# Patient Record
Sex: Male | Born: 1950 | Race: White | Hispanic: No | Smoking: Former smoker
Health system: Southern US, Community
[De-identification: ages and names within clinical notes are randomized; demographics above are authoritative.]

## PROBLEM LIST (undated history)

## (undated) DIAGNOSIS — I639 Cerebral infarction, unspecified: Secondary | ICD-10-CM

## (undated) DIAGNOSIS — E119 Type 2 diabetes mellitus without complications: Secondary | ICD-10-CM

## (undated) DIAGNOSIS — I2510 Atherosclerotic heart disease of native coronary artery without angina pectoris: Secondary | ICD-10-CM

## (undated) DIAGNOSIS — N529 Male erectile dysfunction, unspecified: Secondary | ICD-10-CM

## (undated) DIAGNOSIS — I5042 Chronic combined systolic (congestive) and diastolic (congestive) heart failure: Secondary | ICD-10-CM

## (undated) DIAGNOSIS — R001 Bradycardia, unspecified: Secondary | ICD-10-CM

## (undated) DIAGNOSIS — Z87891 Personal history of nicotine dependence: Secondary | ICD-10-CM

## (undated) DIAGNOSIS — E785 Hyperlipidemia, unspecified: Secondary | ICD-10-CM

## (undated) DIAGNOSIS — N182 Chronic kidney disease, stage 2 (mild): Secondary | ICD-10-CM

## (undated) DIAGNOSIS — I739 Peripheral vascular disease, unspecified: Secondary | ICD-10-CM

## (undated) DIAGNOSIS — T50905A Adverse effect of unspecified drugs, medicaments and biological substances, initial encounter: Secondary | ICD-10-CM

## (undated) DIAGNOSIS — I119 Hypertensive heart disease without heart failure: Secondary | ICD-10-CM

## (undated) DIAGNOSIS — I779 Disorder of arteries and arterioles, unspecified: Secondary | ICD-10-CM

## (undated) HISTORY — PX: COLONOSCOPY: SHX174

## (undated) HISTORY — DX: Bradycardia, unspecified: R00.1

## (undated) HISTORY — PX: CORONARY STENT PLACEMENT: SHX1402

## (undated) HISTORY — PX: CORONARY ARTERY BYPASS GRAFT: SHX141

## (undated) HISTORY — DX: Type 2 diabetes mellitus without complications: E11.9

## (undated) HISTORY — DX: Adverse effect of unspecified drugs, medicaments and biological substances, initial encounter: T50.905A

## (undated) HISTORY — PX: CORONARY ANGIOPLASTY: SHX604

---

## 1996-07-29 HISTORY — PX: CARDIAC SURGERY: SHX584

## 2002-10-04 ENCOUNTER — Encounter: Payer: Self-pay | Admitting: Emergency Medicine

## 2002-10-04 ENCOUNTER — Emergency Department (HOSPITAL_COMMUNITY): Admission: EM | Admit: 2002-10-04 | Discharge: 2002-10-04 | Payer: Self-pay | Admitting: Emergency Medicine

## 2003-08-28 ENCOUNTER — Inpatient Hospital Stay (HOSPITAL_COMMUNITY): Admission: EM | Admit: 2003-08-28 | Discharge: 2003-08-30 | Payer: Self-pay | Admitting: Emergency Medicine

## 2005-08-05 ENCOUNTER — Emergency Department (HOSPITAL_COMMUNITY): Admission: EM | Admit: 2005-08-05 | Discharge: 2005-08-05 | Payer: Self-pay | Admitting: Emergency Medicine

## 2010-05-04 ENCOUNTER — Encounter: Payer: Self-pay | Admitting: Neurology

## 2011-08-26 ENCOUNTER — Emergency Department (HOSPITAL_COMMUNITY)
Admission: EM | Admit: 2011-08-26 | Discharge: 2011-08-26 | Disposition: A | Discharge status: Home / Self Care | Payer: BC Managed Care – PPO | Attending: Cardiovascular Disease | Admitting: Cardiovascular Disease

## 2011-08-26 ENCOUNTER — Encounter (HOSPITAL_COMMUNITY): Payer: Self-pay | Admitting: *Deleted

## 2011-08-26 ENCOUNTER — Emergency Department (HOSPITAL_COMMUNITY): Payer: BC Managed Care – PPO

## 2011-08-26 DIAGNOSIS — R51 Headache: Secondary | ICD-10-CM | POA: Insufficient documentation

## 2011-08-26 DIAGNOSIS — I10 Essential (primary) hypertension: Secondary | ICD-10-CM | POA: Insufficient documentation

## 2011-08-26 DIAGNOSIS — R079 Chest pain, unspecified: Secondary | ICD-10-CM | POA: Insufficient documentation

## 2011-08-26 DIAGNOSIS — I2510 Atherosclerotic heart disease of native coronary artery without angina pectoris: Secondary | ICD-10-CM | POA: Insufficient documentation

## 2011-08-26 HISTORY — DX: Atherosclerotic heart disease of native coronary artery without angina pectoris: I25.10

## 2011-08-26 HISTORY — DX: Cerebral infarction, unspecified: I63.9

## 2011-08-26 LAB — COMPREHENSIVE METABOLIC PANEL
Albumin: 3.6 g/dL (ref 3.5–5.2)
Calcium: 9 mg/dL (ref 8.4–10.5)
GFR calc non Af Amer: 89 mL/min — ABNORMAL LOW (ref >90)
Glucose, Bld: 139 mg/dL — ABNORMAL HIGH (ref 70–99)
Potassium: 3.5 mEq/L (ref 3.5–5.1)
Total Bilirubin: 0.3 mg/dL (ref 0.3–1.2)
Total Protein: 7 g/dL (ref 6.0–8.3)

## 2011-08-26 LAB — CARDIAC PANEL(CRET KIN+CKTOT+MB+TROPI)
CK, MB: 4.3 ng/mL — ABNORMAL HIGH (ref 0.3–4.0)
Relative Index: 2.4 (ref 0.0–2.5)
Total CK: 169 U/L (ref 7–232)
Troponin I: <0.3 ng/mL (ref <0.30)

## 2011-08-26 LAB — DIFFERENTIAL
Eosinophils Relative: 2 % (ref 0–5)
Monocytes Absolute: 0.9 10*3/uL (ref 0.1–1.0)
Neutro Abs: 5.7 10*3/uL (ref 1.7–7.7)

## 2011-08-26 LAB — CBC
Hemoglobin: 16.1 g/dL (ref 13.0–17.0)
MCHC: 35.5 g/dL (ref 30.0–36.0)
Platelets: 179 10*3/uL (ref 150–400)
RDW: 13.8 % (ref 11.5–15.5)
WBC: 8.4 10*3/uL (ref 4.0–10.5)

## 2011-08-26 MED ORDER — HYDRALAZINE HCL 20 MG/ML IJ SOLN
10.0000 mg | INTRAMUSCULAR | Status: DC | PRN
  Filled 2011-08-26: qty 0.5

## 2011-08-26 MED ORDER — TRIAMTERENE-HCTZ 37.5-25 MG PO TABS: 1.0000 | ORAL_TABLET | Freq: Every day | ORAL | Status: DC

## 2011-08-26 MED ORDER — CLOPIDOGREL BISULFATE 75 MG PO TABS: 75.0000 mg | ORAL_TABLET | Freq: Every day | ORAL | Status: DC

## 2011-08-26 MED ORDER — NEBIVOLOL HCL 10 MG PO TABS: 10.0000 mg | ORAL_TABLET | Freq: Every day | ORAL | Status: DC

## 2011-08-26 MED ORDER — HYDRALAZINE HCL 20 MG/ML IJ SOLN
10.0000 mg | Freq: Once | INTRAMUSCULAR | Status: AC
  Administered 2011-08-26: 10 mg via INTRAVENOUS
  Filled 2011-08-26 (×2): qty 0.5

## 2011-08-26 MED ORDER — NITROGLYCERIN 0.4 MG SL SUBL
0.4000 mg | SUBLINGUAL_TABLET | Freq: Once | SUBLINGUAL | Status: AC
  Administered 2011-08-26: 0.4 mg via SUBLINGUAL

## 2011-08-26 MED ORDER — LISINOPRIL 10 MG PO TABS
10.0000 mg | ORAL_TABLET | Freq: Once | ORAL | Status: AC
  Administered 2011-08-26: 10 mg via ORAL
  Filled 2011-08-26: qty 1

## 2011-08-26 MED ORDER — NITROGLYCERIN 0.4 MG SL SUBL: 0.4000 mg | SUBLINGUAL_TABLET | SUBLINGUAL | Status: DC | PRN

## 2011-08-26 MED ORDER — LISINOPRIL 10 MG PO TABS: 10.0000 mg | ORAL_TABLET | Freq: Every day | ORAL | Status: DC

## 2011-08-26 NOTE — ED Notes (Signed)
Pt states elevated blood pressure in 1200s/130 and has not been taking medications on a regular basis.    PT is having heaviness to chest and took 3 basa before coming in.  Pt took 3 zrytec last nite by mistake.

## 2011-08-26 NOTE — Consult Note (Signed)
CARDIOLOGY CONSULT NOTE   Patient ID: Francis Brown MRN: [id number] DOB/AGE: 04/30/1950 61 y.o.  Admit date: 08/26/2011  Primary Physician   No primary provider on file. Primary Cardiologist   Was Dr [name] in 1998 Reason for Consultation   Chest pain  Francis Brown is a 61 y.o. male with a history of CAD.  He is in the process of moving back to [place] from [facility]. He travels extensively for work and has not actually lived in [place] for 3 years. He does not yet have any local physicians in [place]. He has been in [place] for the last 11 months. While there, he has not been able to get a consistent supply of his blood pressure medications. He has taken them as he was able to obtain them. He has been out of his medications for over a month. He has symptoms when he is off his medications that include discomfort in the left side of his chest. He denies visual changes or headaches. When he gets these symptoms, he knows his blood pressure is very high and will try to obtain medical help. He has been hospitalized for this in the past, most recently in [place]. His blood pressure will be appropriately treated and his symptoms will resolve. His last heart catheterization was in [place] in 2010. He had 2 stents at that time.  Yesterday, he did not feel well. Today, he had his usual symptom of hypertension, left-sided chest discomfort. When it did not resolve, he came to the emergency room. In the emergency room his blood pressure is currently 170/113. He feels symptomatic with this. Of note, except for the periods when his blood pressure is very high, he does not have any history of chest pain with exertion, dyspnea with exertion or any other ischemic symptoms. The chest discomfort he feels when his blood pressure is high is nothing like the symptoms he had when he had his 2 heart attacks. He does not normally have any anginal symptoms. According to the patient, his symptoms generally  resolve once his blood pressure control is improved.  Past Medical History  Diagnosis Date  . Hypertension   . Coronary artery disease   . Stroke 2005  . History of percutaneous coronary intervention 10/03/2008    Xience 4.0x12 OM, Xience 4.0x8 OM in [place]  . Myocardial infarct, old 40    happened in [place] - medical therapy  . Myocardial infarct, old 67    happened in [place], came back and had CABG  . S/P CABG x 5 1998   Past Surgical History  Procedure Date  . Cardiac surgery   . Coronary stent placement     No Known Allergies  I have reviewed the patient's current medications   . nitroGLYCERIN  0.4 mg Sublingual Once   Medication Sig   Antiseborrheic Products, Misc. [facility] CREAM)  Apply 1 application topically daily.   aspirin EC 81 MG tablet Take 81 mg by mouth daily. Has  clopidogrel (PLAVIX) 75 MG tablet Take 75 mg by mouth daily. Has  lisinopril (PRINIVIL,ZESTRIL) 10 MG tablet Take 10 mg by mouth daily. Has  metoprolol tartrate (LOPRESSOR) 25 MG tablet Take 12.5 mg by mouth 2 (two) times daily. Almost out  Pyrithione Zinc [facility] SPRAY EX) Apply 1 application topically daily.   triamterene-hydrochlorothiazide (DYAZIDE) 50-25 MG  Take 1 capsule by mouth daily. Out      History   Social History  . Marital Status: Legally Separated  Spouse Name: N/A    Number of Children: N/A  . Years of Education: N/A   Occupational History  . Not on file.   Social History Main Topics  . Smoking status: Current Everyday Smoker  . Smokeless tobacco: Not on file  . Alcohol Use: Yes     occ  . Drug Use: No  . Sexually Active:    History   Social History Narrative   Pt in the process of moving back from [place]. He lives with his fianc. He does not exercise regularly but because he has been in [place] for the last 11 months and is in the process of moving, he has had a high activity level without difficulty. 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 Father died at 28 with a history of cardiac issues. His mother died at 72, no cardiac issues. No sibs with cardiac issues.    ROS: No recent illnesses, fevers or chills. When his BP is high, he feels tired before he gets the chest pain. He walks a great deal and climbs stairs. He denies musculoskeletal or GI complaints. He coughs occasionally but does not consistently wheeze. He has a history of melena. Occasionally he gets discomfort in his left foot such as a burning in the ball of his foot but no numbness. Full 14 point review of systems complete and found to be negative unless listed above.  Physical Exam: Blood pressure 143/77, pulse 55, temperature 97.7 F (36.5 C), temperature source Oral, resp. rate 14, SpO2 98.00%.  General: Well developed, well nourished, male in no acute distress Head: Eyes PERRLA, No xanthomas.   Normocephalic and atraumatic, oropharynx without edema or exudate. Dentition good Lungs: Few Bilateral dry rales Heart: HRRR S1 S2, no rub/gallop, soft systolic murmur. pulses are 2+ 3 extrem but slightly decreased in the left DP.   Neck: No carotid bruit. No lymphadenopathy.  JVD not elevated. Abdomen: Bowel sounds present, abdomen soft and non-tender without masses or hernias noted. Msk:  No spine or cva tenderness. No weakness, no joint deformities or effusions. Extremities: No clubbing or cyanosis. No edema.  Neuro: Alert and oriented X 3. No focal deficits noted. Psych:  Good affect, responds appropriately Skin: No rashes or lesions noted.  Labs:   Lab Results  Component Value Date   WBC 8.4 08/26/2011   HGB 16.1 08/26/2011   HCT 45.3 08/26/2011   MCV 93.6 08/26/2011   PLT 179 08/26/2011     Lab 08/26/11 1335  NA 138  K 3.5  CL 103  CO2 24  BUN 14  CREATININE 0.97  CALCIUM 9.0  PROT 7.0  BILITOT 0.3  ALKPHOS 80  ALT 37  AST 26  GLUCOSE 139*    Basename 08/26/11 1348  CKTOTAL 208  CKMB 4.9*  CK-MB index  2.4    TROPONINI <0.30   ECG:  26-Aug-2011 13:01:58  Normal sinus rhythm Possible Left atrial enlargement Inferior infarct , age undetermined Cannot rule out Anterior infarct , age undetermined Abnormal ECG Since last tracing from 08/04/05, prwp NOW PRESENT q wave in lead 3 persists Vent. rate 69 BPM PR interval 168 ms QRS duration 78 ms QT/QTc 394/422 ms P-R-T axes 59 10 38  Radiology:  Dg Chest Port 1 View 08/26/2011  *RADIOLOGY REPORT*  Clinical Data: Left chest discomfort.  Headache.  Hypertension.  PORTABLE CHEST - 1 VIEW  Comparison: None.  Findings: Both lungs are clear.  Heart size is at upper limits of  normal allowing for portable technique.  No evidence of pleural effusion.  No mass or lymphadenopathy identified.  Prior CABG noted.  IMPRESSION: No active disease.  Original Report Authenticated By: Danae Orleans, M.D.    ASSESSMENT AND PLAN:   The patient was seen today by Dr Elease Hashimoto, the patient evaluated and the data reviewed.  Principal Problem:  *Hypertension - for now, will use IV hydralazine to decrease blood pressure somewhat. We'll restart his home blood pressure medications and follow, with titration PRN.  Active Problems:  Chest pain - his symptoms are atypical and may be entirely secondary to HTN. We will try to obtain records from [facility] on his last heart catheterization. His ECG has no ST elevation. His CK-MB is slightly elevated. His bypass grafts are over 58 years old. He has already had 2 stents in his grafts. Cardiac enzymes are mildly abnormal with an elevated CKMB (index still normal). MD advise on further evaluation/admission.  Signed: Theodore Demark 08/26/2011, 4:46 PM Co-Sign MD  Attending Note:   The patient was seen and examined.  Agree with assessment and plan as noted above.  Pt has hx of CAD, HTN.  He presents tonight with elevated BP mostly due to the fact that he has run out of his meds.  He recently got back from [place] and is moving from [place] to  [place].  He complains of mild chest tightness that occurs when his BP is elevated.  The CP is clearly different from his MI pain.  He is not in any distress tonight. Exam is unremarkable. ECG is unremarkable  Initial set of enzymes reveals a CK MG of 4.9 .  We have rechecked a set of markers now.  If the enzymes have decreased or stayed the same, he can go.  If it is higher, he should stay and be observed.  Will restart Maxzide 1/2 tab a day Change metoprolol to Bystolic 10 mg a day. Add NTG 0.4 Continue lisinopril We will call to arrange follow up in a week or so.  Vesta Mixer, [place] Hageman., MD, [facility] 08/26/2011, 6:16 PM

## 2011-08-26 NOTE — ED Notes (Signed)
Patient denies pain at this time. Resting with SO at the bedside

## 2011-08-26 NOTE — ED Provider Notes (Addendum)
History     CSN: [id number]  Arrival date & time 08/26/11  1255   First MD Initiated Contact with Patient 08/26/11 1318      Chief Complaint  Patient presents with  . Chest Pain    (Consider location/radiation/quality/duration/timing/severity/associated sxs/prior treatment) HPI.... status post CABG x5 vessels in 1998. Additionally patient has been stented. he takes his medications erratically. Complains of chest pain described as a fullness this morning. Blood pressure today was 220/130. No dyspnea diaphoresis or nausea. He had a slight stroke in 2005 with left foot and left fingersinvolvement. Smokes three quarters of pack per day of cigarettes. Father had an MI at age 60.  no diabetes.  A level V caveat for urgent need for intervention  Past Medical History  Diagnosis Date  . Hypertension   . Coronary artery disease   . Stroke     Past Surgical History  Procedure Date  . Cardiac surgery   . Coronary stent placement     No family history on file.  History  Substance Use Topics  . Smoking status: Current Everyday Smoker  . Smokeless tobacco: Not on file  . Alcohol Use: Yes     occ      Review of Systems  All other systems reviewed and are negative.    Allergies  Review of patient's allergies indicates no known allergies.  Home Medications   Current Outpatient Rx  Name Route Sig Dispense Refill  . DERMAZINC CREAM EX CREA Apply externally Apply 1 application topically daily.    . ASPIRIN EC 81 MG PO TBEC Oral Take 81 mg by mouth daily.    [name] CLOPIDOGREL BISULFATE 75 MG PO TABS Oral Take 75 mg by mouth daily.    [name] LISINOPRIL 10 MG PO TABS Oral Take 10 mg by mouth daily.    [name] METOPROLOL TARTRATE 25 MG PO TABS Oral Take 12.5 mg by mouth 2 (two) times daily.    [place] Charon SPRAY EX Apply externally Apply 1 application topically daily.    . TRIAMTERENE-HCTZ 50-25 MG PO CAPS Oral Take 1 capsule by mouth daily.      BP 145/81  Pulse 59  Temp(Src) 97.7 F (36.5  C) (Oral)  Resp 18  SpO2 97%  Physical Exam  Nursing note and vitals reviewed. Constitutional: He is oriented to person, place, and time. He appears well-developed and well-nourished.       Hypertensive  HENT:  Head: Normocephalic and atraumatic.  Eyes: Conjunctivae and EOM are normal. Pupils are equal, round, and reactive to light.  Neck: Normal range of motion. Neck supple.  Cardiovascular: Normal rate and regular rhythm.   Pulmonary/Chest: Effort normal and breath sounds normal.  Abdominal: Soft. Bowel sounds are normal.  Musculoskeletal: Normal range of motion.  Neurological: He is alert and oriented to person, place, and time.  Skin: Skin is warm and dry.  Psychiatric: He has a normal mood and affect.    ED Course  Procedures (including critical care time)  Labs Reviewed  COMPREHENSIVE METABOLIC PANEL - Abnormal; Notable for the following:    Glucose, Bld 139 (*)    GFR calc non Af Amer 89 (*)    All other components within normal limits  CARDIAC PANEL(CRET KIN+CKTOT+MB+TROPI) - Abnormal; Notable for the following:    CK, MB 4.9 (*)    All other components within normal limits  CBC  DIFFERENTIAL   Dg Chest Port 1 View  08/26/2011  *RADIOLOGY REPORT*  Clinical Data: Left chest  discomfort.  Headache.  Hypertension.  PORTABLE CHEST - 1 VIEW  Comparison: None.  Findings: Both lungs are clear.  Heart size is at upper limits of normal allowing for portable technique.  No evidence of pleural effusion.  No mass or lymphadenopathy identified.  Prior CABG noted.  IMPRESSION: No active disease.  Original Report Authenticated By: Danae Orleans, M.D.     No diagnosis found.   Date: 08/26/2011  Rate: 69  Rhythm: normal sinus rhythm  QRS Axis: normal  Intervals: normal  ST/T Wave abnormalities: normal  Conduction Disutrbances: none  Narrative Interpretation: unremarkable     MDM  Patient has multiple cardiac risk factors. Given nitroglycerin and aspirin in ED.  discussed  with cardiologist Dr. Elease Hashimoto.  He will consult on patient        Donnetta Hutching, MD 08/26/11 1533  Donnetta Hutching, MD 09/24/11 1540

## 2011-08-26 NOTE — ED Notes (Signed)
Patient presents with c/o chest discomfort that is different from his 2 MI's in the past.  He returned from [place] 3 weeks ago and had not been taking his medications as directed.  Stated he has been feeling tired and weak and took his BP last night and it was elevated.  Took his BP this morning and it was 221 SBP so he decided to come to the hospital.

## 2011-08-26 NOTE — Discharge Instructions (Signed)
Chest Pain (Nonspecific) It is often hard to give a specific diagnosis for the cause of chest pain. There is always a chance that your pain could be related to something serious, such as a heart attack or a blood clot in the lungs. You need to follow up with your caregiver for further evaluation. CAUSES   Heartburn.   Pneumonia or bronchitis.   Anxiety or stress.   Inflammation around your heart (pericarditis) or lung (pleuritis or pleurisy).   A blood clot in the lung.   A collapsed lung (pneumothorax). It can develop suddenly on its own (spontaneous pneumothorax) or from injury (trauma) to the chest.   Shingles infection (herpes zoster virus).  The chest wall is composed of bones, muscles, and cartilage. Any of these can be the source of the pain.  The bones can be bruised by injury.   The muscles or cartilage can be strained by coughing or overwork.   The cartilage can be affected by inflammation and become sore (costochondritis).  DIAGNOSIS  Lab tests or other studies, such as X-rays, electrocardiography, stress testing, or cardiac imaging, may be needed to find the cause of your pain.  TREATMENT   Treatment depends on what may be causing your chest pain. Treatment may include:   Acid blockers for heartburn.   Anti-inflammatory medicine.   Pain medicine for inflammatory conditions.   Antibiotics if an infection is present.   You may be advised to change lifestyle habits. This includes stopping smoking and avoiding alcohol, caffeine, and chocolate.   You may be advised to keep your head raised (elevated) when sleeping. This reduces the chance of acid going backward from your stomach into your esophagus.   Most of the time, nonspecific chest pain will improve within 2 to 3 days with rest and mild pain medicine.  HOME CARE INSTRUCTIONS   If antibiotics were prescribed, take your antibiotics as directed. Finish them even if you start to feel better.   For the next few  days, avoid physical activities that bring on chest pain. Continue physical activities as directed.   Do not smoke.   Avoid drinking alcohol.   Only take over-the-counter or prescription medicine for pain, discomfort, or fever as directed by your caregiver.   Follow your caregiver's suggestions for further testing if your chest pain does not go away.   Keep any follow-up appointments you made. If you do not go to an appointment, you could develop lasting (chronic) problems with pain. If there is any problem keeping an appointment, you must call to reschedule.  SEEK MEDICAL CARE IF:   You think you are having problems from the medicine you are taking. Read your medicine instructions carefully.   Your chest pain does not go away, even after treatment.   You develop a rash with blisters on your chest.  SEEK IMMEDIATE MEDICAL CARE IF:   You have increased chest pain or pain that spreads to your arm, neck, jaw, back, or abdomen.   You develop shortness of breath, an increasing cough, or you are coughing up blood.   You have severe back or abdominal pain, feel nauseous, or vomit.   You develop severe weakness, fainting, or chills.   You have a fever.  THIS IS AN EMERGENCY. Do not wait to see if the pain will go away. Get medical help at once. Call your local emergency services (911 in [place] Do not drive yourself to the hospital. MAKE SURE YOU:   Understand these instructions.     Will watch your condition.   Will get help right away if you are not doing well or get worse.  Document Released: 01/08/2005 Document Revised: 03/20/2011 Document Reviewed: 11/04/2007 ExitCare Patient Information 2012 [employer].  RESOURCE GUIDE  Dental Problems  Patients with Medicaid: [facility]                     [facility] [address]                                           [address] Phone:  [phone]                                                    Phone:  [phone]  If unable to pay or uninsured, contact:  [facility] or [facility] to become qualified for the adult dental clinic.  Chronic Pain Problems Contact  Chronic [facility] Clinic  [phone] Patients need to be referred by their primary care doctor.  Insufficient Money for Medicine Contact United Way:  call "211" or [facility] 271-5999.  No Primary Care Doctor Call [facility]  832-8000 Other agencies that provide inexpensive medical care    [facility]  832-8035    [facility]  832-7272    [facility]  [phone]    Women[facility] Clinic  832-4777    [facility]  [phone]    [facility]  [phone]  Psychological Services [facility]  [phone] [facility]  [phone] [facility]   800 853-5163 (emergency services 641-4993)  Abuse/Neglect [place] Child Abuse Hotline (336) 641-3795 [place] Child Abuse Hotline 800-378-5315 (After Hours)  Emergency Shelter [facility] (336) 271-5985  [facility] (336) 275-9566 [facility] (704) 372-4663  MRSA Hotline #:   832-7006    [place] [facility]  [facility] [place]  [employer]                           [facility] [address] [place]                     [address]         [address]  [place]                                               [place]                              [place] Phone:  [phone]                                  Phone:  [phone]                   Phone:    [phone]  [facility] Phone:  [phone]  [place] Child Abuse Hotline (336) 342-1394 (336) 342-3537 (After Hours)  

## 2011-08-26 NOTE — ED Provider Notes (Signed)
BP 176/99  Pulse 56  Temp(Src) 97.7 F (36.5 C) (Oral)  Resp 18  SpO2 99%  Discussed with cardiology. Recommends repeat CE. If trop negative and no further elevation in CK-MB will discharge home with outpatient f/u. They have recommended changing his medications (see cardiology note for full details) and given him an extra dose of lisinopril in ED. Patient comfortable with plan. No CP at this time.  BP 147/94 in room at this time.   Forbes Cellar, MD 08/26/11 [phone]

## 2011-09-02 ENCOUNTER — Other Ambulatory Visit: Payer: BC Managed Care – PPO

## 2011-09-02 ENCOUNTER — Encounter: Payer: BC Managed Care – PPO | Admitting: Nurse Practitioner

## 2012-09-15 ENCOUNTER — Other Ambulatory Visit (HOSPITAL_COMMUNITY): Payer: Self-pay | Admitting: Physician Assistant

## 2012-09-16 ENCOUNTER — Other Ambulatory Visit: Payer: Self-pay

## 2012-09-16 NOTE — Telephone Encounter (Signed)
Ok to fill pt lisinopril minimum amount 30 day with no refills.indicated pt needs to schedule appt for additional refills in refill authorization per Alfonso Ramus, RN

## 2013-01-11 ENCOUNTER — Emergency Department (HOSPITAL_COMMUNITY): Payer: BC Managed Care – PPO

## 2013-01-11 ENCOUNTER — Encounter (HOSPITAL_COMMUNITY): Payer: Self-pay | Admitting: Adult Health

## 2013-01-11 ENCOUNTER — Other Ambulatory Visit: Payer: Self-pay

## 2013-01-11 ENCOUNTER — Inpatient Hospital Stay (HOSPITAL_COMMUNITY)
Admission: EM | Admit: 2013-01-11 | Discharge: 2013-01-13 | DRG: 014 | Disposition: A | Discharge status: Home / Self Care | Payer: BC Managed Care – PPO | Attending: Internal Medicine | Admitting: Internal Medicine

## 2013-01-11 DIAGNOSIS — G8190 Hemiplegia, unspecified affecting unspecified side: Secondary | ICD-10-CM | POA: Diagnosis present

## 2013-01-11 DIAGNOSIS — Z9861 Coronary angioplasty status: Secondary | ICD-10-CM

## 2013-01-11 DIAGNOSIS — Z79899 Other long term (current) drug therapy: Secondary | ICD-10-CM

## 2013-01-11 DIAGNOSIS — I6340 Cerebral infarction due to embolism of unspecified cerebral artery: Principal | ICD-10-CM | POA: Diagnosis present

## 2013-01-11 DIAGNOSIS — I639 Cerebral infarction, unspecified: Secondary | ICD-10-CM | POA: Diagnosis present

## 2013-01-11 DIAGNOSIS — Z951 Presence of aortocoronary bypass graft: Secondary | ICD-10-CM

## 2013-01-11 DIAGNOSIS — E785 Hyperlipidemia, unspecified: Secondary | ICD-10-CM | POA: Diagnosis present

## 2013-01-11 DIAGNOSIS — F17200 Nicotine dependence, unspecified, uncomplicated: Secondary | ICD-10-CM | POA: Diagnosis present

## 2013-01-11 DIAGNOSIS — I498 Other specified cardiac arrhythmias: Secondary | ICD-10-CM | POA: Diagnosis present

## 2013-01-11 DIAGNOSIS — I10 Essential (primary) hypertension: Secondary | ICD-10-CM | POA: Diagnosis present

## 2013-01-11 DIAGNOSIS — Z7982 Long term (current) use of aspirin: Secondary | ICD-10-CM

## 2013-01-11 DIAGNOSIS — I252 Old myocardial infarction: Secondary | ICD-10-CM

## 2013-01-11 DIAGNOSIS — I739 Peripheral vascular disease, unspecified: Secondary | ICD-10-CM | POA: Diagnosis present

## 2013-01-11 DIAGNOSIS — Z7902 Long term (current) use of antithrombotics/antiplatelets: Secondary | ICD-10-CM

## 2013-01-11 DIAGNOSIS — I2510 Atherosclerotic heart disease of native coronary artery without angina pectoris: Secondary | ICD-10-CM | POA: Diagnosis present

## 2013-01-11 DIAGNOSIS — Z8673 Personal history of transient ischemic attack (TIA), and cerebral infarction without residual deficits: Secondary | ICD-10-CM

## 2013-01-11 HISTORY — DX: Disorder of arteries and arterioles, unspecified: I77.9

## 2013-01-11 HISTORY — DX: Peripheral vascular disease, unspecified: I73.9

## 2013-01-11 HISTORY — DX: Male erectile dysfunction, unspecified: N52.9

## 2013-01-11 HISTORY — DX: Hyperlipidemia, unspecified: E78.5

## 2013-01-11 LAB — COMPREHENSIVE METABOLIC PANEL
AST: 23 U/L (ref 0–37)
Albumin: 4.1 g/dL (ref 3.5–5.2)
BUN: 12 mg/dL (ref 6–23)
Creatinine, Ser: 1.07 mg/dL (ref 0.50–1.35)
GFR calc Af Amer: 85 mL/min — ABNORMAL LOW (ref >90)
Potassium: 3.8 mEq/L (ref 3.5–5.1)
Total Bilirubin: 0.4 mg/dL (ref 0.3–1.2)
Total Protein: 7.7 g/dL (ref 6.0–8.3)

## 2013-01-11 LAB — DIFFERENTIAL
Basophils Absolute: 0.1 10*3/uL (ref 0.0–0.1)
Basophils Relative: 0 % (ref 0–1)
Eosinophils Absolute: 0.2 10*3/uL (ref 0.0–0.7)
Monocytes Absolute: 0.8 10*3/uL (ref 0.1–1.0)
Monocytes Relative: 7 % (ref 3–12)
Neutrophils Relative %: 78 % — ABNORMAL HIGH (ref 43–77)

## 2013-01-11 LAB — TROPONIN I: Troponin I: <0.3 ng/mL (ref <0.30)

## 2013-01-11 LAB — POCT I-STAT TROPONIN I: Troponin i, poc: 0 ng/mL (ref 0.00–0.08)

## 2013-01-11 LAB — CBC
Hemoglobin: 16.6 g/dL (ref 13.0–17.0)
MCH: 33.7 pg (ref 26.0–34.0)
MCHC: 36 g/dL (ref 30.0–36.0)
RDW: 13.7 % (ref 11.5–15.5)

## 2013-01-11 LAB — GLUCOSE, CAPILLARY: Glucose-Capillary: 146 mg/dL — ABNORMAL HIGH (ref 70–99)

## 2013-01-11 LAB — APTT: aPTT: 27 seconds (ref 24–37)

## 2013-01-11 LAB — PROTIME-INR: INR: 1.02 (ref 0.00–1.49)

## 2013-01-11 MED ORDER — IBUPROFEN 400 MG PO TABS
600.0000 mg | ORAL_TABLET | Freq: Once | ORAL | Status: AC
  Administered 2013-01-11: 22:00:00 600 mg via ORAL
  Filled 2013-01-11 (×2): qty 1

## 2013-01-11 NOTE — ED Notes (Signed)
Pt states that his right arm when numb about 3 hours ago and his hand was unable to move. Pt states he took 3 ASA and took his BP which was 157/88. Pt states he took it three times and all were similar to each other. Pt states he has a left sided headache for the last 4 weeks. He also states that he has had some blurry vision for about 3 weeks when looking at the computer screen.

## 2013-01-11 NOTE — ED Notes (Signed)
Resident MD in to see pt.

## 2013-01-11 NOTE — ED Notes (Signed)
Dr. Taylor in to see pt. 

## 2013-01-11 NOTE — ED Notes (Signed)
Presents with intermittent gradual onset of left sided headaches, blurred vision for 2 weeks, today left sided headache gradual came on this AM and then at 18:30  Right arm became numb and weak. Arm numbness resolved after 3 ASA and pt came to ER. Neurologically intact. No facial droop, no drift, no weakness. Alert and oriented. C/o headache on the left side, denies blurred vision.

## 2013-01-11 NOTE — ED Notes (Signed)
Pt back from CT

## 2013-01-12 ENCOUNTER — Encounter (HOSPITAL_COMMUNITY): Payer: Self-pay | Admitting: *Deleted

## 2013-01-12 ENCOUNTER — Inpatient Hospital Stay (HOSPITAL_COMMUNITY): Payer: BC Managed Care – PPO

## 2013-01-12 DIAGNOSIS — I10 Essential (primary) hypertension: Secondary | ICD-10-CM

## 2013-01-12 DIAGNOSIS — G459 Transient cerebral ischemic attack, unspecified: Secondary | ICD-10-CM

## 2013-01-12 DIAGNOSIS — I6350 Cerebral infarction due to unspecified occlusion or stenosis of unspecified cerebral artery: Secondary | ICD-10-CM

## 2013-01-12 DIAGNOSIS — I517 Cardiomegaly: Secondary | ICD-10-CM

## 2013-01-12 DIAGNOSIS — I639 Cerebral infarction, unspecified: Secondary | ICD-10-CM | POA: Diagnosis present

## 2013-01-12 DIAGNOSIS — I6340 Cerebral infarction due to embolism of unspecified cerebral artery: Principal | ICD-10-CM

## 2013-01-12 DIAGNOSIS — I6529 Occlusion and stenosis of unspecified carotid artery: Secondary | ICD-10-CM

## 2013-01-12 MED ORDER — ASPIRIN EC 81 MG PO TBEC
81.0000 mg | DELAYED_RELEASE_TABLET | Freq: Every day | ORAL | Status: DC
  Administered 2013-01-12 – 2013-01-13 (×2): 81 mg via ORAL
  Filled 2013-01-12 (×2): qty 1

## 2013-01-12 MED ORDER — ATORVASTATIN CALCIUM 40 MG PO TABS
40.0000 mg | ORAL_TABLET | Freq: Every day | ORAL | Status: DC
  Administered 2013-01-12 – 2013-01-13 (×2): 40 mg via ORAL
  Filled 2013-01-12 (×2): qty 1

## 2013-01-12 MED ORDER — ASPIRIN EC 81 MG PO TBEC: 81.0000 mg | DELAYED_RELEASE_TABLET | Freq: Every day | ORAL | Status: DC

## 2013-01-12 MED ORDER — HEPARIN SODIUM (PORCINE) 5000 UNIT/ML IJ SOLN
5000.0000 [IU] | Freq: Three times a day (TID) | INTRAMUSCULAR | Status: DC
  Administered 2013-01-12 – 2013-01-13 (×5): 5000 [IU] via SUBCUTANEOUS
  Filled 2013-01-12 (×7): qty 1

## 2013-01-12 MED ORDER — ONDANSETRON HCL 4 MG/2ML IJ SOLN: 4.0000 mg | Freq: Three times a day (TID) | INTRAMUSCULAR | Status: AC | PRN

## 2013-01-12 MED ORDER — CLOPIDOGREL BISULFATE 75 MG PO TABS
75.0000 mg | ORAL_TABLET | Freq: Every day | ORAL | Status: DC
  Administered 2013-01-12 – 2013-01-13 (×2): 75 mg via ORAL
  Filled 2013-01-12 (×3): qty 1

## 2013-01-12 NOTE — Progress Notes (Addendum)
Patient seen and admitted earlier this am by my associate. Please refer to the H and P for details regarding assessment and plan.  Will reassess next am.  Penny Pia  Continue stroke work up and recommendations per Neurologist team on board.

## 2013-01-12 NOTE — Progress Notes (Signed)
Stroke Team Progress Note  HISTORY [facility] is an 62 y.o. male, right handed, with a past medical history significant for HTN, CAD s/p CABG x5 and s/p PTCA, MI, smoking, stroke without residual deficit, presented to [facility] with a history of left sided HA x 1 month and transient episode of left arm paresthesias and weakness.   Stated that he never had similar symptoms in the right or left side of his body. He tells me that for the past 4 weeks he has been experiencing daily, nearly constant, mildly intense head pain localized over the left frontal area without associated nausea, vomiting, slurred speech, double vision, language or vision impairment. No recent fever, infection, head or neck trauma.   Tonight, approximately 2 hours before arriving to the ED, he developed acute onset of tingling and numbness from the right shoulder down to his hand, as well as inability to use the right hand due to weakness. The episode lasted for about 10 minutes and completely resolved.   His symptoms prompted CT brain that was unremarkable but MRI-DWI brain disclosed several punctate foci of reduced diffusion within the left frontal and parietal lobes numbering at least four en total.  At this moment, he complains only of mid left head pain but otherwise thinks that he is back to his normal state.  On daily aspirin and plavix   Patient was not a TPA candidate secondary to resolution of symptoms. He was admitted for further evaluation and treatment.  SUBJECTIVE Patient lying in the bed.  Overall he feels his condition is completely resolved. Has headache intermittently over one month. Had a stroke "pin" in 2006. His sx at that time were nausea and vomiting. Stroke was in back of head.  OBJECTIVE Most recent Vital Signs: Filed Vitals:   01/12/13 0500 01/12/13 0819 01/12/13 1000 01/12/13 1414  BP: 112/56 125/68 127/69 126/68  Pulse: 59 56 54 57  Temp: 98 F (36.7 C) 98.2 F (36.8 C) 98.3 F (36.8 C) 98.1  F (36.7 C)  TempSrc: Oral Oral Oral Oral  Resp: 18 18 18 18   Height:      Weight:      SpO2: 97% 97% 94% 98%   CBG (last 3)   Recent Labs  01/11/13 2011  GLUCAP 146*    IV Fluid Intake:     MEDICATIONS  . aspirin EC  81 mg Oral Daily  . atorvastatin  40 mg Oral Daily  . clopidogrel  75 mg Oral Daily  . heparin  5,000 Units Subcutaneous Q8H   PRN:    Diet:  Cardiac thin liquids Activity:   DVT Prophylaxis:  Heparin SQ  CLINICALLY SIGNIFICANT STUDIES Basic Metabolic Panel:   Recent Labs Lab 01/11/13 2006  NA 137  K 3.8  CL 99  CO2 21  GLUCOSE 151*  BUN 12  CREATININE 1.07  CALCIUM 9.2   Liver Function Tests:   Recent Labs Lab 01/11/13 2006  AST 23  ALT 36  ALKPHOS 80  BILITOT 0.4  PROT 7.7  ALBUMIN 4.1   CBC:   Recent Labs Lab 01/11/13 2006  WBC 12.7*  NEUTROABS 9.9*  HGB 16.6  HCT 46.1  MCV 93.7  PLT 186   Coagulation:   Recent Labs Lab 01/11/13 2006  LABPROT 13.2  INR 1.02   Cardiac Enzymes:   Recent Labs Lab 01/11/13 2006  TROPONINI <0.30   Urinalysis: No results found for this basename: COLORURINE, APPERANCEUR, LABSPEC, PHURINE, GLUCOSEU, HGBUR, BILIRUBINUR, KETONESUR, PROTEINUR,  UROBILINOGEN, NITRITE, LEUKOCYTESUR,  in the last 168 hours Lipid Panel No results found for this basename: chol,  trig,  hdl,  cholhdl,  vldl,  ldlcalc   HgbA1C  Lab Results  Component Value Date   HGBA1C 6.4* 01/12/2013    Urine Drug Screen:   No results found for this basename: labopia,  cocainscrnur,  labbenz,  amphetmu,  thcu,  labbarb    Alcohol Level: No results found for this basename: ETH,  in the last 168 hours  Ct Head (brain) Wo Contrast 01/11/2013    Normal head CT.    Mr [name] Head Wo Contrast 01/12/2013  : Normal intracranial MR angiography of the large and medium size vessels.   Mr Brain Wo Contrast 01/11/2013    Multiple punctate left frontotemporal foci of reduced diffusion concerning for embolic phenomenon in left  cerebral artery territory.  No hemorrhagic conversion.  Mild white matter changes suggestive chronic small vessel ischemic disease in addition to remote posterior pontine infarct.    2D Echocardiogram    Carotid Doppler  Right - 1% to 39% ICA stenosis. Vertebral artery flow is antegrade. Left - There is an 80% to 99% ICA stenosis. Vertebral artery flow is antegrade   CXR    EKG  normal sinus rhythm.   Therapy Recommendations    Physical Exam   Head: normocephalic.  Neck: supple, no bruits, no JVD.  Cardiac: no murmurs.  Lungs: clear.  Abdomen: soft, no tender, no mass.  Extremities: no edema Neurological Exam ;  Awake  Alert oriented x 3. Normal speech and language.eye movements full without nystagmus.fundi were not visualized. Vision acuity and fields appear normal. Hearing is normal. Palatal movements are normal. Face symmetric. Tongue midline. Normal strength, tone, reflexes and coordination. Normal sensation. Gait deferred. ASSESSMENT Mr. [facility] is a 62 y.o. male presenting with transient right arm hemisensory/hemiparesis. Imaging confirms multiple punctate left frontotemporal foci of reduced diffusion concerning for embolic phenomenon in left cerebral artery territory. Infarct felt to be embolic due to underlying cardiac source or most likely due to subtotal occlusive disease of left internal carotid artery.  On aspirin 81 mg orally every day and clopidogrel 75 mg orally every day prior to admission. Now on aspirin 81 mg orally every day and clopidogrel 75 mg orally every day for secondary stroke prevention. Patient with resultant transient symptoms. Work up underway.   Hypertension  Coronary artery disease, s/p CABG, stents  Symptomatic carotid stenosis, left.  Brown day # 1  TREATMENT/PLAN  Continue aspirin 81 mg orally every day and clopidogrel 75 mg orally every day for secondary stroke prevention. Await TTE. (will not pursue TEE/LOOP as carotid disease  source of symptoms) Risk factor modification Doppler results: I have consulted [facility], Dr. [name] for surgical evaluation for symptomatic left carotid stenosis.   I have personally obtained a history, examined the patient, evaluated imaging results, and formulated the assessment and plan of care. I agree with the above. Delia Heady, MD

## 2013-01-12 NOTE — Progress Notes (Addendum)
[facility]  CONSULT NOTE   MRN : [id number]   Reason for Consult: TIA in pt with >80% L ICA stenosis   Referring Physician: Dr Sethi   History of Present Illness: [facility] is a 62 y.o. male with PMH, MI, CABG (1998) and coronary stenting, Smoker, borderline DM and PAD who states that yesterday he developed sudden onset of right UE numbness and weakness which resolved in 10 minutes. He had had a left frontal H/A for several days with visual disturbances prior to this - "cloudy" vision in the left eye. All of these symptoms have completely resolved including the H/A. He states he was told he had a "TIA" in 2005 with nausea,vomiting with MRA of neck showing moderate stenosis of Left ICA. CT head at that time Negative for CVA.   Past Medical History   Diagnosis  Date   .  Hypertension    .  Coronary artery disease    .  Stroke  2005   .  History of percutaneous coronary intervention  10/03/2008     Xience 4.0x12 OM, Xience 4.0x8 OM in [place]   .  Myocardial infarct, old  1996     happened in [place] - medical therapy   .  Myocardial infarct, old  1998     happened in [place], came back and had CABG   .  S/P CABG x 5  1998    Past Surgical History   Procedure  Laterality  Date   .  Cardiac surgery     .  Coronary stent placement     Social History  History   Substance Use Topics   .  Smoking status:  Current Every Day Smoker -- 0.80 packs/day for 30 years     Types:  Cigarettes   .  Smokeless tobacco:  Not on file   .  Alcohol Use:  Yes      Comment: occ   Family History  Father had HX HTN, CAD - died at age 65  No Known Allergies  Current Facility-Administered Medications   Medication  Dose  Route  Frequency  Provider  Last Rate  Last Dose   .  aspirin EC tablet 81 mg  81 mg  Oral  Daily  Osvaldo Camilo, MD   81 mg at 01/12/13 1012   .  atorvastatin (LIPITOR) tablet 40 mg  40 mg  Oral  Daily  Jared M. Gardner, DO   40 mg at 01/12/13 1012   .   clopidogrel (PLAVIX) tablet 75 mg  75 mg  Oral  Daily  Jared M. Gardner, DO   75 mg at 01/12/13 0753   .  heparin injection 5,000 Units  5,000 Units  Subcutaneous  Q8H  Jared M. Gardner, DO   5,000 Units at 01/12/13 0619   Pt meds include:  Statin :Yes  Betablocker: Yes  ASA: Yes  Other anticoagulants/antiplatelets: plavix   REVIEW OF SYSTEMS  General: [ ] Weight loss, [ ] Fever, [ ] chills  Neurologic: [ ] Dizziness, [ ] Blackouts, [ ] Seizure  [ ] Stroke, [x ] "Mini stroke", [ ] Slurred speech, [ ] Temporary blindness; [x ] weakness in arms or legs, [ ] Hoarseness [ ] Dysphagia  Cardiac: [ ] Chest pain/pressure, [ ] Shortness of breath at rest [ ] Shortness of breath with exertion, [ ] Atrial fibrillation or irregular heartbeat  Vascular: [ ] Pain in legs with walking, [ ]   Pain in legs at rest, [ ] Pain in legs at night,  [ ] Non-healing ulcer, [ ] Blood clot in vein/DVT,  Pulmonary: [ ] Home oxygen, [ ] Productive cough, [ ] Coughing up blood, [ ] Asthma,  [ ] Wheezing [ ] COPD  Musculoskeletal: [ ] Arthritis, [ ] Low back pain, [ ] Joint pain  Hematologic: [ ] Easy Bruising, [ ] Anemia; [ ] Hepatitis  Gastrointestinal: [ ] Blood in stool, [ ] Gastroesophageal Reflux/heartburn,  Urinary: [ ] chronic Kidney disease, [ ] on HD - [ ] MWF or [ ] TTHS, [ ] Burning with urination, [ ] Difficulty urinating  Skin: [ ] Rashes, [ ] Wounds  Psychological: [ ] Anxiety, [ ] Depression   Physical Examination  Filed Vitals:    01/12/13 0500  01/12/13 0819  01/12/13 1000  01/12/13 1414   BP:  112/56  125/68  127/69  126/68   Pulse:  59  56  54  57   Temp:  98 F (36.7 C)  98.2 F (36.8 C)  98.3 F (36.8 C)  98.1 F (36.7 C)   TempSrc:  Oral  Oral  Oral  Oral   Resp:  18  18  18  18   Height:       Weight:       SpO2:  97%  97%  94%  98%   Body mass index is 31.45 kg/(m^2).  General: WDWN in NAD  Gait: Normal  HENT: WNL  Eyes: Pupils equal  Pulmonary: normal non-labored breathing ,  without Rales, rhonchi, wheezing  Cardiac: RRR, without Murmurs, rubs or gallops;  No carotid bruits bilat.  Abdomen: soft, NT, no masses  Skin: no rashes, ulcers noted; no Gangrene , no cellulitis; no open wounds;  Vascular Exam/Pulses: 3+ Radial pulses bilaterally  DP/PT pulses are not palpable  BLE are warm with normal color, motion and sensation  Musculoskeletal: no muscle wasting or atrophy; no edema  Neurologic: A&O X 3; Appropriate Affect ;  SENSATION: normal;  MOTOR FUNCTION: 5/5 Symmetric  Speech is fluent/normal  No tongue deviation or facial droop noted  Lymph: no LAD  Psych: judgment intact, mood and affect appropriate for clinical situation   Significant Diagnostic Studies:  CBC  Lab Results   Component  Value  Date    WBC  12.7*  01/11/2013    HGB  16.6  01/11/2013    HCT  46.1  01/11/2013    MCV  93.7  01/11/2013    PLT  186  01/11/2013   BMET    Component  Value  Date/Time    NA  137  01/11/2013 2006    K  3.8  01/11/2013 2006    CL  99  01/11/2013 2006    CO2  21  01/11/2013 2006    GLUCOSE  151*  01/11/2013 2006    BUN  12  01/11/2013 2006    CREATININE  1.07  01/11/2013 2006    CALCIUM  9.2  01/11/2013 2006    GFRNONAA  74*  01/11/2013 2006    GFRAA  85*  01/11/2013 2006   Estimated Creatinine Clearance: 92 ml/min (by C-G formula based on Cr of 1.07).  COAG  Lab Results   Component  Value  Date    INR  1.02  01/11/2013    Non-Invasive Vascular Imaging: Carotid duplex completed. 01/12/2013  Preliminary report: Right - 1% to 39% ICA stenosis. Vertebral artery flow   is antegrade. Left - There is an 80% to 99% ICA stenosis. Vertebral artery flow is antegrade.  Radiology:   Ct Head (brain) Wo Contrast  01/11/2013 CLINICAL DATA: Headache with right arm numbness. EXAM: CT HEAD WITHOUT CONTRAST TECHNIQUE: Contiguous axial images were obtained from the base of the skull through the vertex without intravenous contrast. COMPARISON: None. FINDINGS: The brain demonstrates no  evidence of hemorrhage, infarction, edema, mass effect, extra-axial fluid collection, hydrocephalus or mass lesion. The skull is unremarkable. IMPRESSION: Normal head CT. Electronically Signed By: Glenn Yamagata On: 01/11/2013 21:29   Mr Mra Head Wo Contrast  01/12/2013 CLINICAL DATA: Right-sided weakness. Acute stroke by MRI. EXAM: MRA HEAD WITHOUT CONTRAST TECHNIQUE: MRA HEAD WITHOUT CONTRAST COMPARISON: 01/11/2013 FINDINGS: Both internal carotid arteries are widely patent into the brain. The anterior and middle cerebral vessels are patent without proximal stenosis, aneurysm or vascular malformation. Both vertebral arteries are patent to the basilar. No basilar stenosis. Posterior circulation branch vessels are patent. There are large posterior communicating arteries bilaterally. IMPRESSION: Normal intracranial MR angiography of the large and medium size vessels. Electronically Signed By: Mark Shogry M.D. On: 01/12/2013 10:11  Mr Brain Wo Contrast  01/11/2013 *RADIOLOGY REPORT* Clinical Data: Headache for 1 month. Blurred vision. MRI HEAD WITHOUT CONTRAST Technique: Multiplanar, multiecho pulse sequences of the brain and surrounding structures were obtained according to standard protocol without intravenous contrast. Comparison: CT of head January 11, 2013 at 2119 hours. Findings: Punctate foci of reduced diffusion within the left frontal and parietal lobes numbering at least four en toto. Minimal FLAIR hyperintense signal. No susceptibility artifact to suggest hemorrhage. The ventricles and sulci are normal for patient's age. A few scattered sub centimeter supratentorial white matter FLAIR T2 hyperintensities are seen, suggesting sequela chronic small vessel ischemic disease, within normal range for patient's age. Superimposed to 9 x 5 mm posterior pontine midline T2 hyperintensity most consistent with each remote ischemia. No midline shift or mass effect. No abnormal extra-axial fluid collections. Normal  major intracranial vascular flow voids seen at the skull base. No suspicious calvarial bone marrow signal. Craniocervical junction maintained. Fluid replaced sella is not expanded. Ocular globes and orbital contents are not suspicious but not tailored for evaluation. Under pneumatized frontal sinuses, no paranasal sinus air-fluid levels. Mastoid air cells are well-aerated. Mild temporomandibular osteoarthrosis. IMPRESSION: Multiple punctate left frontotemporal foci of reduced diffusion concerning for embolic phenomenon in left cerebral artery territory. No hemorrhagic conversion. Mild white matter changes suggestive chronic small vessel ischemic disease in addition to remote posterior pontine infarct. Critical Value/emergent results were called by telephone at the time of interpretation on January 11, 2013 at 2300 hours to Megan Taylor, who verbally acknowledged these results. Original Report Authenticated By: Courtnay Bloomer   ASSESSMENT/PLAN: Emani R Bednarczyk is a 62 y.o. male with Hx CAD and PAD who had TIA yesterday with all symptoms resolved. Pt had evidence of moderate Left ICA stenosis in 2005. Carotid duplex done today showed 80-99% Left ICA stenosis. Pt symptoms correspond with left side stenosis. Will get CTA of neck to confirm carotid duplex and to assess position of carotid bifurcation in the neck. Timing and need for left CEA will be decided once CTA is reviewed. Continue ASA. Pt will need cardiac clearance for surgery. He see Dr. Turner with [facility].   ROCZNIAK,REGINA J  01/12/2013  4:17 PM   Addendum  I have independently interviewed and examined the patient, and I agree with the physician assistant's findings. Pt's sx correspond to disease on

## 2013-01-12 NOTE — Evaluation (Signed)
Physical Therapy Evaluation Patient Details Name: [facility] MRN: [id number] DOB: Apr 04, 1951 Today's Date: 01/12/2013 Time: 1030-1040 PT Time Calculation (min): 10 min  PT Assessment / Plan / Recommendation History of Present Illness  pt presents with 4 week hx of L sided headache and L UE numbness, which has now resolved.    Clinical Impression  Pt independent with all mobility.  No further PT needs at this time.  Pt ed on s/s of CVA.  Will sign off.      PT Assessment  Patent does not need any further PT services    Follow Up Recommendations  No PT follow up    Does the patient have the potential to tolerate intense rehabilitation      Barriers to Discharge        Equipment Recommendations  None recommended by PT    Recommendations for Other Services     Frequency      Precautions / Restrictions Precautions Precautions: None Restrictions Weight Bearing Restrictions: No   Pertinent Vitals/Pain Denies pain.        Mobility  Bed Mobility Bed Mobility: Supine to Sit;Sitting - Scoot to Edge of Bed Supine to Sit: 7: Independent Sitting - Scoot to Edge of Bed: 7: Independent Transfers Transfers: Sit to Stand;Stand to Sit Sit to Stand: 7: Independent;From bed Stand to Sit: 7: Independent;To bed Ambulation/Gait Ambulation/Gait Assistance: 7: Independent Ambulation Distance (Feet): 250 Feet Assistive device: None Gait Pattern: Within Functional Limits Stairs: Yes Stairs Assistance: 7: Independent Stair Management Technique: One rail Right;Forwards Number of Stairs: 11 Wheelchair Mobility Wheelchair Mobility: No Modified Rankin (Stroke Patients Only) Pre-Morbid Rankin Score: No symptoms Modified Rankin: No symptoms    Exercises     PT Diagnosis:    PT Problem List:   PT Treatment Interventions:       PT Goals(Current goals can be found in the care plan section)    Visit Information  Last PT Received On: 01/12/13 Assistance Needed: +1 History of  Present Illness: pt presents with 4 week hx of L sided headache and L UE numbness, which has now resolved.         Prior Functioning  Home Living Family/patient expects to be discharged to:: Private residence Living Arrangements: Spouse/significant other Available Help at Discharge: Family;Available PRN/intermittently Type of Home: House Home Access: Stairs to enter Entrance Stairs-Number of Steps: 3 Home Layout: Multi-level Alternate Level Stairs-Number of Steps: flight Alternate Level Stairs-Rails: Right Home Equipment: None Prior Function Level of Independence: Independent Communication Communication: No difficulties Dominant Hand: Right    Cognition  Cognition Arousal/Alertness: Awake/alert Behavior During Therapy: WFL for tasks assessed/performed Overall Cognitive Status: Within Functional Limits for tasks assessed    Extremity/Trunk Assessment Upper Extremity Assessment Upper Extremity Assessment: Overall WFL for tasks assessed Lower Extremity Assessment Lower Extremity Assessment: Overall WFL for tasks assessed Cervical / Trunk Assessment Cervical / Trunk Assessment: Normal   Balance Balance Balance Assessed: Yes Standardized Balance Assessment Standardized Balance Assessment: Dynamic Gait Index Dynamic Gait Index Level Surface: Normal Change in Gait Speed: Normal Gait with Horizontal Head Turns: Normal Gait with Vertical Head Turns: Normal Gait and Pivot Turn: Normal Step Over Obstacle: Normal Step Around Obstacles: Normal Steps: Normal Total Score: 24  End of Session PT - End of Session Activity Tolerance: Patient tolerated treatment well Patient left: in bed;with call bell/phone within reach Nurse Communication: Mobility status  GP     [facility], [place] [phone] 01/12/2013, 12:15 PM

## 2013-01-12 NOTE — Progress Notes (Signed)
Echocardiogram 2D Echocardiogram has been performed.  Rashawn Rayman 01/12/2013, 12:40 PM

## 2013-01-12 NOTE — Progress Notes (Signed)
VASCULAR LAB PRELIMINARY  PRELIMINARY  PRELIMINARY  PRELIMINARY  Carotid duplex completed.    Preliminary report:  Right - 1% to 39% ICA stenosis. Vertebral artery flow is antegrade. Left -  There is an 80% to 99% ICA stenosis. Vertebral artery flow is antegrade.  [facility], RVS 01/12/2013, 12:41 PM

## 2013-01-12 NOTE — ED Provider Notes (Signed)
CSN: [id number]     Arrival date & time 01/11/13  1936 History   First MD Initiated Contact with Patient 01/11/13 2034     Chief Complaint  Patient presents with  . Stroke Symptoms   (Consider location/radiation/quality/duration/timing/severity/associated sxs/prior Treatment) HPI Comments: 62 y/o male with history of CAD s/p CABG, prior CVA in 2005 with no residual deficits presenting with left temporal headache x 1 month and new right arm weakness/numbness. Reports daily intermittent headaches in left temporal region associated with blurry vision when looking at the computer screen. Right arm became numb and weak at 18:30, lasted 10 minutes and resolved. No numbness/weakness on arrival. Also reports HA resolving. No fever, dizziness, voice change, difficulty swallowing, gait disturbance.  The history is provided by the patient. No language interpreter was used.    Past Medical History  Diagnosis Date  . Hypertension   . Coronary artery disease   . Stroke 2005  . History of percutaneous coronary intervention 10/03/2008    Xience 4.0x12 OM, Xience 4.0x8 OM in [place]  . Myocardial infarct, old 72    happened in [place] - medical therapy  . Myocardial infarct, old 73    happened in [place], came back and had CABG  . S/P CABG x 5 1998   Past Surgical History  Procedure Laterality Date  . Cardiac surgery    . Coronary stent placement     History reviewed. No pertinent family history. History  Substance Use Topics  . Smoking status: Current Every Day Smoker -- 0.80 packs/day for 30 years    Types: Cigarettes  . Smokeless tobacco: Not on file  . Alcohol Use: Yes     Comment: occ    Review of Systems  Constitutional: Negative for fever and activity change.  HENT: Negative for hearing loss, congestion and sinus pressure.   Eyes: Positive for visual disturbance. Negative for pain.  Respiratory: Negative for chest tightness and shortness of breath.   Cardiovascular: Negative for  chest pain and palpitations.  Musculoskeletal: Negative for gait problem.  Neurological: Positive for numbness and headaches. Negative for dizziness, tremors, seizures, facial asymmetry, speech difficulty and weakness.  All other systems reviewed and are negative.    Allergies  Review of patient's allergies indicates no known allergies.  Home Medications   Current Outpatient Rx  Name  Route  Sig  Dispense  Refill  . Antiseborrheic Products, Misc. [facility] CREAM) CREA   Apply externally   Apply 1 application topically daily.         [name] aspirin EC 81 MG tablet   Oral   Take 81 mg by mouth daily.         [name] atorvastatin (LIPITOR) 40 MG tablet   Oral   Take 40 mg by mouth daily.         [name] CIALIS 20 MG tablet   Oral   Take 20 mg by mouth daily as needed.         . clopidogrel (PLAVIX) 75 MG tablet   Oral   Take 1 tablet (75 mg total) by mouth daily.   30 tablet   11   . lisinopril (PRINIVIL,ZESTRIL) 10 MG tablet      TAKE 1 TABLET BY MOUTH DAILY.   30 tablet   0     Patient must schedule and keep a follow appointmen ...   . metoprolol succinate (TOPROL-XL) 25 MG 24 hr tablet   Oral   Take 25 mg by mouth daily.         [name]  EXPIRED: nebivolol (BYSTOLIC) 10 MG tablet   Oral   Take 1 tablet (10 mg total) by mouth daily.   30 tablet   11   . Pyrithione Zinc [facility] SPRAY EX)   Apply externally   Apply 1 application topically daily.         [name] EXPIRED: triamterene-hydrochlorothiazide (MAXZIDE-25) 37.5-25 MG per tablet   Oral   Take 1 each (1 tablet total) by mouth daily.   30 tablet   11   . EXPIRED: nitroGLYCERIN (NITROSTAT) 0.4 MG SL tablet   Sublingual   Place 1 tablet (0.4 mg total) under the tongue every 5 (five) minutes as needed for chest pain.   25 tablet   3    BP 113/71  Pulse 52  Temp(Src) 98.8 F (37.1 C) (Oral)  Resp 13  Ht 6' (1.829 m)  Wt 231 lb (104.781 kg)  BMI 31.32 kg/m2  SpO2 97% Physical Exam  Vitals  reviewed. Constitutional: He is oriented to person, place, and time. He appears well-developed and well-nourished. No distress.  HENT:  Head: Atraumatic.  Eyes: EOM are normal. Pupils are equal, round, and reactive to light.  Neck: Neck supple. Carotid bruit is not present.  Cardiovascular: Normal rate, regular rhythm, normal heart sounds and intact distal pulses.   Pulmonary/Chest: Effort normal and breath sounds normal.  Abdominal: Soft. Bowel sounds are normal. He exhibits no distension. There is no tenderness.  Musculoskeletal: Normal range of motion.  Neurological: He is alert and oriented to person, place, and time. He has normal strength and normal reflexes. No cranial nerve deficit or sensory deficit. He exhibits normal muscle tone. He displays a negative Romberg sign. Coordination and gait normal. GCS eye subscore is 4. GCS verbal subscore is 5. GCS motor subscore is 6.  No pronator drift. Normal FTN and heel shin testing.   Skin: Skin is warm and dry.    ED Course  Procedures (including critical care time) Labs Review Labs Reviewed  CBC - Abnormal; Notable for the following:    WBC 12.7 (*)    All other components within normal limits  DIFFERENTIAL - Abnormal; Notable for the following:    Neutrophils Relative % 78 (*)    Neutro Abs 9.9 (*)    All other components within normal limits  COMPREHENSIVE METABOLIC PANEL - Abnormal; Notable for the following:    Glucose, Bld 151 (*)    GFR calc non Af Amer 74 (*)    GFR calc Af Amer 85 (*)    All other components within normal limits  GLUCOSE, CAPILLARY - Abnormal; Notable for the following:    Glucose-Capillary 146 (*)    All other components within normal limits  PROTIME-INR  APTT  TROPONIN I  POCT I-STAT TROPONIN I   Imaging Review Ct Head (brain) Wo Contrast  01/11/2013   CLINICAL DATA:  Headache with right arm numbness.  EXAM: CT HEAD WITHOUT CONTRAST  TECHNIQUE: Contiguous axial images were obtained from the base of  the skull through the vertex without intravenous contrast.  COMPARISON:  None.  FINDINGS: The brain demonstrates no evidence of hemorrhage, infarction, edema, mass effect, extra-axial fluid collection, hydrocephalus or mass lesion. The skull is unremarkable.  IMPRESSION: Normal head CT.   Electronically Signed   By: Irish Lack   On: 01/11/2013 21:29   Mr Brain Wo Contrast  01/11/2013   *RADIOLOGY REPORT*  Clinical Data: Headache for 1 month.  Blurred vision.  MRI HEAD WITHOUT CONTRAST  Technique:  Multiplanar, multiecho pulse sequences of the brain and surrounding structures were obtained according to standard protocol without intravenous contrast.  Comparison: CT of head January 11, 2013 at 2119 hours.  Findings: Punctate foci of reduced diffusion within the left frontal and parietal lobes numbering at least four en toto. Minimal FLAIR hyperintense signal.  No susceptibility artifact to suggest hemorrhage.  The ventricles and sulci are normal for patient's age.  A few scattered sub centimeter supratentorial white matter FLAIR T2 hyperintensities are seen, suggesting sequela chronic small vessel ischemic disease, within normal range for patient's age. Superimposed to 9 x 5 mm posterior pontine midline T2 hyperintensity most consistent with each remote ischemia.  No midline shift or mass effect.  No abnormal extra-axial fluid collections.  Normal major intracranial vascular flow voids seen at the skull base.  No suspicious calvarial bone marrow signal.  Craniocervical junction maintained.  Fluid replaced sella is not expanded.  Ocular globes and orbital contents are not suspicious but not tailored for evaluation.  Under pneumatized frontal sinuses, no paranasal sinus air-fluid levels.  Mastoid air cells are well-aerated.  Mild temporomandibular osteoarthrosis.  IMPRESSION: Multiple punctate left frontotemporal foci of reduced diffusion concerning for embolic phenomenon in left cerebral artery territory.  No  hemorrhagic conversion.  Mild white matter changes suggestive chronic small vessel ischemic disease in addition to remote posterior pontine infarct.  Critical Value/emergent results were called by telephone at the time of interpretation on January 11, 2013 at 2300 hours to Abagail Kitchens, who verbally acknowledged these results.   Original Report Authenticated By: Awilda Metro    Date: 01/12/2013  Rate: 66  Rhythm: normal sinus rhythm  QRS Axis: normal  Intervals: normal  ST/T Wave abnormalities: normal  Conduction Disutrbances:none  Narrative Interpretation: sinus rhythm, no ischemic changes  Old EKG Reviewed: unchanged    MDM  No diagnosis found.  62 y/o male with history of CAD, prior MI s/p CABG, CVA presenting with one month of left temporal headaches and new onset right arm numbness/weakness of 10 min duration. Arm symptoms now resolved. Nonfocal neurological exam. MRI showing multiple punctate left frontotemporal ischemic lesions. Embolic source suspected but patient has no history of atrial fibrillation and EKG demonstrates NSR. Labs unremarkable. Neurology consulted for evaluation and recommend further stroke evaluation. Medicine to admit.   Labs and imaging reviewed in my medical decision making. Patient discussed with my attending, Dr. Rosalia Hammers.    Abagail Kitchens, MD 01/12/13 1250

## 2013-01-12 NOTE — Consult Note (Addendum)
NEURO HOSPITALIST CONSULT NOTE    Reason for Consult: HA, right arm paresthesias and weakness (resolved)  HPI:                                                                                                                                          Francis Brown is an 62 y.o. male, right handed, with a past medical history significant for HTN, CAD s/p CABG x5 and s/p PTCA, MI, smoking, stroke without residual deficit, presented to [facility] ED with a history of left sided HA x 1 month and transient episode of left arm paresthesias and weakness. Stated that he never had similar symptoms in the right or left side of his body. He tells me that for the past 4 weeks he has been experiencing daily, nearly constant, mildly intense head pain localized over the left frontal area without associated nausea, vomiting, slurred speech, double vision, language or vision impairment. No recent fever, infection, head or neck trauma. Tonight, approximately 2 hours before arriving to the ED, he developed acute onset of tingling and numbness from the right shoulder down to his hand, as well as inability to use the right hand due to weakness. The episode lasted for about 10 minutes and completely resolved. His symptoms prompted CT brain that was unremarkable but MRI-DWI brain disclosed several punctate foci of reduced diffusion within the left frontal and parietal lobes numbering at least four en total.   At this moment, he complains only of mid left head pain but otherwise thinks that he is back to his normal state. On daily aspirin and plavix.   Past Medical History  Diagnosis Date  . Hypertension   . Coronary artery disease   . Stroke 2005  . History of percutaneous coronary intervention 10/03/2008    Xience 4.0x12 OM, Xience 4.0x8 OM in [place]  . Myocardial infarct, old 37    happened in [place] - medical therapy  . Myocardial infarct, old 11    happened in [place], came back and had CABG  .  S/P CABG x 5 1998    Past Surgical History  Procedure Laterality Date  . Cardiac surgery    . Coronary stent placement      History reviewed. No pertinent family history.   Social History:  reports that he has been smoking Cigarettes.  He has a 24 pack-year smoking history. He does not have any smokeless tobacco history on file. He reports that  drinks alcohol. He reports that he does not use illicit drugs.  No Known Allergies  MEDICATIONS:  I have reviewed the patient's current medications.   ROS:                                                                                                                                       History obtained from the patient and chart review.  General ROS: negative for - chills, fatigue, fever, night sweats, weight gain or weight loss Psychological ROS: negative for - behavioral disorder, hallucinations, memory difficulties, mood swings or suicidal ideation Ophthalmic ROS: negative for - blurry vision, double vision, eye pain or loss of vision ENT ROS: negative for - epistaxis, nasal discharge, oral lesions, sore throat, tinnitus or vertigo Allergy and Immunology ROS: negative for - hives or itchy/watery eyes Hematological and Lymphatic ROS: negative for - bleeding problems, bruising or swollen lymph nodes Endocrine ROS: negative for - galactorrhea, hair pattern changes, polydipsia/polyuria or temperature intolerance Respiratory ROS: negative for - cough, hemoptysis, shortness of breath or wheezing Cardiovascular ROS: negative for - chest pain, dyspnea on exertion, edema or irregular heartbeat Gastrointestinal ROS: negative for - abdominal pain, diarrhea, hematemesis, nausea/vomiting or stool incontinence Genito-Urinary ROS: negative for - dysuria, hematuria, incontinence or urinary frequency/urgency Musculoskeletal ROS: negative  for - joint swelling or muscular weakness Neurological ROS: as noted in HPI Dermatological ROS: negative for rash and skin lesion changes   Physical exam: pleasant male in no apparent distress. Blood pressure 113/71, pulse 52, temperature 98.8 F (37.1 C), temperature source Oral, resp. rate 13, height 6' (1.829 m), weight 104.781 kg (231 lb), SpO2 97.00%. Head: normocephalic. Neck: supple, no bruits, no JVD. Cardiac: no murmurs. Lungs: clear. Abdomen: soft, no tender, no mass. Extremities: no edema.    Neurologic Examination:                                                                                                        No results found for this basename: cbc, bmp, coags, chol, tri, ldl, hga1c    Results for orders placed during the hospital encounter of 01/11/13 (from the past 48 hour(s))  PROTIME-INR     Status: None   Collection Time    01/11/13  8:06 PM      Result Value Range   Prothrombin Time 13.2  11.6 - 15.2 seconds   INR 1.02  0.00 - 1.49  APTT     Status: None   Collection Time    01/11/13  8:06 PM      Result Value Range   aPTT  27  24 - 37 seconds  CBC     Status: Abnormal   Collection Time    01/11/13  8:06 PM      Result Value Range   WBC 12.7 (*) 4.0 - 10.5 K/uL   RBC 4.92  4.22 - 5.81 MIL/uL   Hemoglobin 16.6  13.0 - 17.0 g/dL   HCT 78.4  69.6 - 29.5 %   MCV 93.7  78.0 - 100.0 fL   MCH 33.7  26.0 - 34.0 pg   MCHC 36.0  30.0 - 36.0 g/dL   RDW 28.4  13.2 - 44.0 %   Platelets 186  150 - 400 K/uL  DIFFERENTIAL     Status: Abnormal   Collection Time    01/11/13  8:06 PM      Result Value Range   Neutrophils Relative % 78 (*) 43 - 77 %   Neutro Abs 9.9 (*) 1.7 - 7.7 K/uL   Lymphocytes Relative 14  12 - 46 %   Lymphs Abs 1.8  0.7 - 4.0 K/uL   Monocytes Relative 7  3 - 12 %   Monocytes Absolute 0.8  0.1 - 1.0 K/uL   Eosinophils Relative 1  0 - 5 %   Eosinophils Absolute 0.2  0.0 - 0.7 K/uL   Basophils Relative 0  0 - 1 %   Basophils Absolute  0.1  0.0 - 0.1 K/uL  COMPREHENSIVE METABOLIC PANEL     Status: Abnormal   Collection Time    01/11/13  8:06 PM      Result Value Range   Sodium 137  135 - 145 mEq/L   Potassium 3.8  3.5 - 5.1 mEq/L   Chloride 99  96 - 112 mEq/L   CO2 21  19 - 32 mEq/L   Glucose, Bld 151 (*) 70 - 99 mg/dL   BUN 12  6 - 23 mg/dL   Creatinine, Ser 1.02  0.50 - 1.35 mg/dL   Calcium 9.2  8.4 - 72.5 mg/dL   Total Protein 7.7  6.0 - 8.3 g/dL   Albumin 4.1  3.5 - 5.2 g/dL   AST 23  0 - 37 U/L   ALT 36  0 - 53 U/L   Alkaline Phosphatase 80  39 - 117 U/L   Total Bilirubin 0.4  0.3 - 1.2 mg/dL   GFR calc non Af Amer 74 (*) >90 mL/min   GFR calc Af Amer 85 (*) >90 mL/min   Comment: (NOTE)     The eGFR has been calculated using the CKD EPI equation.     This calculation has not been validated in all clinical situations.     eGFR's persistently <90 mL/min signify possible Chronic Kidney     Disease.  TROPONIN I     Status: None   Collection Time    01/11/13  8:06 PM      Result Value Range   Troponin I <0.30  <0.30 ng/mL   Comment:            Due to the release kinetics of cTnI,     a negative result within the first hours     of the onset of symptoms does not rule out     myocardial infarction with certainty.     If myocardial infarction is still suspected,     repeat the test at appropriate intervals.  GLUCOSE, CAPILLARY     Status: Abnormal   Collection Time    01/11/13  8:11 PM      Result Value Range   Glucose-Capillary 146 (*) 70 - 99 mg/dL   Comment 1 Documented in Chart     Comment 2 Notify RN    POCT I-STAT TROPONIN I     Status: None   Collection Time    01/11/13  8:13 PM      Result Value Range   Troponin i, poc 0.00  0.00 - 0.08 ng/mL   Comment 3            Comment: Due to the release kinetics of cTnI,     a negative result within the first hours     of the onset of symptoms does not rule out     myocardial infarction with certainty.     If myocardial infarction is still  suspected,     repeat the test at appropriate intervals.    Ct Head (brain) Wo Contrast  01/11/2013   CLINICAL DATA:  Headache with right arm numbness.  EXAM: CT HEAD WITHOUT CONTRAST  TECHNIQUE: Contiguous axial images were obtained from the base of the skull through the vertex without intravenous contrast.  COMPARISON:  None.  FINDINGS: The brain demonstrates no evidence of hemorrhage, infarction, edema, mass effect, extra-axial fluid collection, hydrocephalus or mass lesion. The skull is unremarkable.  IMPRESSION: Normal head CT.   Electronically Signed   By: Irish Lack   On: 01/11/2013 21:29   Mr Brain Wo Contrast  01/11/2013   *RADIOLOGY REPORT*  Clinical Data: Headache for 1 month.  Blurred vision.  MRI HEAD WITHOUT CONTRAST  Technique:  Multiplanar, multiecho pulse sequences of the brain and surrounding structures were obtained according to standard protocol without intravenous contrast.  Comparison: CT of head January 11, 2013 at 2119 hours.  Findings: Punctate foci of reduced diffusion within the left frontal and parietal lobes numbering at least four en toto. Minimal FLAIR hyperintense signal.  No susceptibility artifact to suggest hemorrhage.  The ventricles and sulci are normal for patient's age.  A few scattered sub centimeter supratentorial white matter FLAIR T2 hyperintensities are seen, suggesting sequela chronic small vessel ischemic disease, within normal range for patient's age. Superimposed to 9 x 5 mm posterior pontine midline T2 hyperintensity most consistent with each remote ischemia.  No midline shift or mass effect.  No abnormal extra-axial fluid collections.  Normal major intracranial vascular flow voids seen at the skull base.  No suspicious calvarial bone marrow signal.  Craniocervical junction maintained.  Fluid replaced sella is not expanded.  Ocular globes and orbital contents are not suspicious but not tailored for evaluation.  Under pneumatized frontal sinuses, no  paranasal sinus air-fluid levels.  Mastoid air cells are well-aerated.  Mild temporomandibular osteoarthrosis.  IMPRESSION: Multiple punctate left frontotemporal foci of reduced diffusion concerning for embolic phenomenon in left cerebral artery territory.  No hemorrhagic conversion.  Mild white matter changes suggestive chronic small vessel ischemic disease in addition to remote posterior pontine infarct.  Critical Value/emergent results were called by telephone at the time of interpretation on January 11, 2013 at 2300 hours to Abagail Kitchens, who verbally acknowledged these results.   Original Report Authenticated By: Awilda Metro     Assessment/Plan: 62 Y/O with multiple risk factors for stroke, comes in with HA for the last 4 weeks and transient episode of right UE paresthesias and hand weakness that lasted for about 10 minutes. MRI-DWI showed findings consistent with small embolic infarctions left MCA distribution. On dual antiplatelet therapy. Admit  to medicine and complete stroke work up. Stroke team to resume care in the morning.  Wyatt Portela ,MD [facility] [phone]  01/12/2013, 12:00 AM

## 2013-01-12 NOTE — H&P (Signed)
[facility] History and Physical  AREND BAHL WUJ:[id number] DOB: 27-Jul-1950 DOA: 01/11/2013  Referring physician: ED PCP: No primary provider on file.   Chief Complaint: Headache, R arm weakness  HPI: Francis Brown is a 62 y.o. male who presented to [facility] with h/o L sided headache x 1 month and transient episode of R arm paresthesias and weakness.  Symptoms of weakness started 2 hours before arriving to ED.  Headaches had been going on for the past 4 weeks.  Weakness lasted about 10 mins but then spontaneously resolved.  MRI brain done in the ED demonstrates punctate infarctions on the L side c/w embolic stroke.  Hospitalist asked to admit to complete stroke work up.  Review of Systems: 12 systems reviewed and otherwise negative.  Past Medical History  Diagnosis Date  . Hypertension   . Coronary artery disease   . Stroke 2005  . History of percutaneous coronary intervention 10/03/2008    Xience 4.0x12 OM, Xience 4.0x8 OM in [place]  . Myocardial infarct, old 79    happened in [place] - medical therapy  . Myocardial infarct, old 45    happened in [place], came back and had CABG  . S/P CABG x 5 1998   Past Surgical History  Procedure Laterality Date  . Cardiac surgery    . Coronary stent placement     Social History:  reports that he has been smoking Cigarettes.  He has a 24 pack-year smoking history. He does not have any smokeless tobacco history on file. He reports that  drinks alcohol. He reports that he does not use illicit drugs.   No Known Allergies  History reviewed. No pertinent family history.  Prior to Admission medications   Medication Sig Start Date End Date Taking? Authorizing English as a second language teacher, Misc. [facility] CREAM) CREA Apply 1 application topically daily.   Yes Historical Provider, MD  aspirin EC 81 MG tablet Take 81 mg by mouth daily.   Yes Historical Provider, MD  atorvastatin (LIPITOR) 40 MG tablet Take 40 mg by mouth daily.  12/28/12  Yes Historical Provider, MD  CIALIS 20 MG tablet Take 20 mg by mouth daily as needed. 12/30/12  Yes Historical Provider, MD  clopidogrel (PLAVIX) 75 MG tablet Take 1 tablet (75 mg total) by mouth daily. 08/26/11  Yes Rhonda G Barrett, PA-C  lisinopril (PRINIVIL,ZESTRIL) 10 MG tablet TAKE 1 TABLET BY MOUTH DAILY. 09/15/12  Yes Vesta Mixer, MD  metoprolol succinate (TOPROL-XL) 25 MG 24 hr tablet Take 25 mg by mouth daily.   Yes Historical Provider, MD  nebivolol (BYSTOLIC) 10 MG tablet Take 1 tablet (10 mg total) by mouth daily. 08/26/11 01/11/13 Yes Rhonda G Barrett, PA-C  Pyrithione Zinc (DERMAZINC SPRAY EX) Apply 1 application topically daily.   Yes Historical Provider, MD  triamterene-hydrochlorothiazide (MAXZIDE-25) 37.5-25 MG per tablet Take 1 each (1 tablet total) by mouth daily. 08/26/11 01/11/13 Yes Rhonda G Barrett, PA-C  nitroGLYCERIN (NITROSTAT) 0.4 MG SL tablet Place 1 tablet (0.4 mg total) under the tongue every 5 (five) minutes as needed for chest pain. 08/26/11 08/25/12  Darrol Jump, PA-C   Physical Exam: Filed Vitals:   01/12/13 0000  BP: 153/77  Pulse: 45  Temp:   Resp: 15    General:  NAD, resting comfortably in bed Eyes: PEERLA EOMI ENT: mucous membranes moist Neck: supple w/o JVD Cardiovascular: RRR w/o MRG Respiratory: CTA B Abdomen: soft, nt, nd, bs+ Skin: no rash nor lesion Musculoskeletal: MAE, full  ROM all 4 extremities Psychiatric: normal tone and affect Neurologic: AAOx3, grossly non-focal  Labs on Admission:  Basic Metabolic Panel:  Recent Labs Lab 01/11/13 2006  NA 137  K 3.8  CL 99  CO2 21  GLUCOSE 151*  BUN 12  CREATININE 1.07  CALCIUM 9.2   Liver Function Tests:  Recent Labs Lab 01/11/13 2006  AST 23  ALT 36  ALKPHOS 80  BILITOT 0.4  PROT 7.7  ALBUMIN 4.1   No results found for this basename: LIPASE, AMYLASE,  in the last 168 hours No results found for this basename: AMMONIA,  in the last 168 hours CBC:  Recent  Labs Lab 01/11/13 2006  WBC 12.7*  NEUTROABS 9.9*  HGB 16.6  HCT 46.1  MCV 93.7  PLT 186   Cardiac Enzymes:  Recent Labs Lab 01/11/13 2006  TROPONINI <0.30    BNP (last 3 results) No results found for this basename: PROBNP,  in the last 8760 hours CBG:  Recent Labs Lab 01/11/13 2011  GLUCAP 146*    Radiological Exams on Admission: Ct Head (brain) Wo Contrast  01/11/2013   CLINICAL DATA:  Headache with right arm numbness.  EXAM: CT HEAD WITHOUT CONTRAST  TECHNIQUE: Contiguous axial images were obtained from the base of the skull through the vertex without intravenous contrast.  COMPARISON:  None.  FINDINGS: The brain demonstrates no evidence of hemorrhage, infarction, edema, mass effect, extra-axial fluid collection, hydrocephalus or mass lesion. The skull is unremarkable.  IMPRESSION: Normal head CT.   Electronically Signed   By: Irish Lack   On: 01/11/2013 21:29   Mr Brain Wo Contrast  01/11/2013   *RADIOLOGY REPORT*  Clinical Data: Headache for 1 month.  Blurred vision.  MRI HEAD WITHOUT CONTRAST  Technique:  Multiplanar, multiecho pulse sequences of the brain and surrounding structures were obtained according to standard protocol without intravenous contrast.  Comparison: CT of head January 11, 2013 at 2119 hours.  Findings: Punctate foci of reduced diffusion within the left frontal and parietal lobes numbering at least four en toto. Minimal FLAIR hyperintense signal.  No susceptibility artifact to suggest hemorrhage.  The ventricles and sulci are normal for patient's age.  A few scattered sub centimeter supratentorial white matter FLAIR T2 hyperintensities are seen, suggesting sequela chronic small vessel ischemic disease, within normal range for patient's age. Superimposed to 9 x 5 mm posterior pontine midline T2 hyperintensity most consistent with each remote ischemia.  No midline shift or mass effect.  No abnormal extra-axial fluid collections.  Normal major  intracranial vascular flow voids seen at the skull base.  No suspicious calvarial bone marrow signal.  Craniocervical junction maintained.  Fluid replaced sella is not expanded.  Ocular globes and orbital contents are not suspicious but not tailored for evaluation.  Under pneumatized frontal sinuses, no paranasal sinus air-fluid levels.  Mastoid air cells are well-aerated.  Mild temporomandibular osteoarthrosis.  IMPRESSION: Multiple punctate left frontotemporal foci of reduced diffusion concerning for embolic phenomenon in left cerebral artery territory.  No hemorrhagic conversion.  Mild white matter changes suggestive chronic small vessel ischemic disease in addition to remote posterior pontine infarct.  Critical Value/emergent results were called by telephone at the time of interpretation on January 11, 2013 at 2300 hours to Abagail Kitchens, who verbally acknowledged these results.   Original Report Authenticated By: Awilda Metro    EKG: Independently reviewed.  Assessment/Plan Principal Problem:   Acute embolic stroke Active Problems:   Hypertension   1. Acute embolic  stroke - L MCA distribution, small punctate lesions c/w emboli.  Stroke work up ordered, on stroke pathway.  Does not appear to have residual deficit at this point in time. 2. HTN - allowing permissive HTN given acute ischemic CVA.  Treat if BP gets to be above 220 or symptoms of HTN develop.    Code Status: Full Code (must indicate code status--if unknown or must be presumed, indicate so) Family Communication: No family in room (indicate person spoken with, if applicable, with phone number if by telephone) Disposition Plan: Admit to inpatient (indicate anticipated LOS)  Time spent: 70 min  GARDNER, JARED M. [facility] Pager [phone]  If 7PM-7AM, please contact night-coverage [email or web address] Password [email or web address] 01/12/2013, 12:43 AM

## 2013-01-13 ENCOUNTER — Encounter (HOSPITAL_COMMUNITY): Payer: Self-pay | Admitting: Radiology

## 2013-01-13 ENCOUNTER — Other Ambulatory Visit: Payer: Self-pay

## 2013-01-13 ENCOUNTER — Inpatient Hospital Stay (HOSPITAL_COMMUNITY): Payer: BC Managed Care – PPO

## 2013-01-13 DIAGNOSIS — I2510 Atherosclerotic heart disease of native coronary artery without angina pectoris: Secondary | ICD-10-CM

## 2013-01-13 LAB — LIPID PANEL
Cholesterol: 149 mg/dL (ref 0–200)
HDL: 32 mg/dL — ABNORMAL LOW (ref >39)
LDL Cholesterol: 71 mg/dL (ref 0–99)
Triglycerides: 229 mg/dL — ABNORMAL HIGH (ref <150)
VLDL: 46 mg/dL — ABNORMAL HIGH (ref 0–40)

## 2013-01-13 MED ORDER — IOHEXOL 350 MG/ML SOLN
50.0000 mL | Freq: Once | INTRAVENOUS | Status: AC | PRN
  Administered 2013-01-13: 50 mL via INTRAVENOUS

## 2013-01-13 MED ORDER — CLOPIDOGREL BISULFATE 75 MG PO TABS: 75.0000 mg | ORAL_TABLET | Freq: Every day | ORAL | Status: DC

## 2013-01-13 NOTE — Progress Notes (Signed)
[facility]   Daily Progress Note  Assessment/Planning: TIA, sx L ICA stenosis >80%   CTA Neck consistent with >80% plaque with necrotic core vs thrombus in LICA  Would complete cardiac risk stratification and preop optimization   I do not generally recommend CAS in pt with necrotic core  Continue maximal medical mgmt: ok with plavix and aspirin  Will tenatively schedule L CEA for this coming Tuesday.  Subjective    No events overnight  Objective Filed Vitals:   01/12/13 2152 01/13/13 0153 01/13/13 0513 01/13/13 1017  BP: 120/71 108/65 108/64 126/74  Pulse: 52 70 52 54  Temp: 98.4 F (36.9 C)  97.6 F (36.4 C) 97.9 F (36.6 C)  TempSrc: Oral Oral Oral Oral  Resp: 18 18 17 18   Height:      Weight:      SpO2: 96% 94% 99% 94%    Intake/Output Summary (Last 24 hours) at 01/13/13 1232 Last data filed at 01/13/13 0840  Gross per 24 hour  Intake   1120 ml  Output      0 ml  Net   1120 ml   NECK Normal morphology, carotid pulse prominent at hyoid level NEURO CN grossly intact, motor sym 5/5  Laboratory CBC    Component Value Date/Time   WBC 12.7* 01/11/2013 2006   HGB 16.6 01/11/2013 2006   HCT 46.1 01/11/2013 2006   PLT 186 01/11/2013 2006    BMET    Component Value Date/Time   NA 137 01/11/2013 2006   K 3.8 01/11/2013 2006   CL 99 01/11/2013 2006   CO2 21 01/11/2013 2006   GLUCOSE 151* 01/11/2013 2006   BUN 12 01/11/2013 2006   CREATININE 1.07 01/11/2013 2006   CALCIUM 9.2 01/11/2013 2006   GFRNONAA 74* 01/11/2013 2006   GFRAA 85* 01/11/2013 2006    Leonides Sake, MD [facility] Office: [phone] Pager: [phone]  01/13/2013, 12:32 PM

## 2013-01-13 NOTE — Consult Note (Signed)
CARDIOLOGY CONSULT NOTE  Patient ID: Francis Brown MRN: [id number], DOB/AGE: 60/23/1952   Admit date: 01/11/2013 Date of Consult: 01/13/2013  Primary Physician: No primary provider on file. Primary Cardiologist: T. Mayford Knife, MD  Pt. Profile  Francis Brown is a 62 year old male with CAD (MI '96, MI '98, CABGx5 '98, stenting x2 in 2010), CVA in 2005, HTN, current smoker who presented to ED with TIA. Subsequent w/u found 80-99% stenosis in LICA.   Problem List  Past Medical History  Diagnosis Date  . Hypertension   . Coronary artery disease     a. 1996 s/p MI [place];  b. 1998 s/p MI in [employer] with CABGx5 in [place];  b. 2010 s/p stenting to the OM in [place], [place] (Xience 4.0x12 DES, Xience 4.0x54mm DES)  . Hyperlipidemia   . Erectile dysfunction   . Stroke     a. 2005;  b. 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  . Carotid disease, bilateral     a. 01/2013 Carotid U/S: RICA 1-39%, LICA 80-99%. Antegrade vertebral flow.  . Tobacco abuse     Past Surgical History  Procedure Laterality Date  . Cardiac surgery    . Coronary stent placement      Allergies  No Known Allergies  HPI   Francis Brown is a 62 year old male with CAD (MI '96, MI '98, CABGx5 '98, stenting x2 in 2010), CVA in 2005, HTN, who is a current smoker. He presented to the ED on 9/30 complaining of acute onset tingling and numbness from the right shoulder down to the right hand. He was unable to use his right hand at all. This episode lasted about 10 minutes and completely resolved on it's own. He also c/o 1 month h/o left sided HA that had been fairly constant and was described as mildly intense. An MRI was done which showed small embolic infarctions in the left MCA distribution, and he was admitted to the hospital. A carotid ultrasound was performed yesterday which showed 1-39% stenosis in the RICA, and an 80-99% stenosis in the LICA. CTA of neck was then completed which confirmed high-grade  stenosis of greater than 80% within the proximal LICA. Patient is felt to have had a TIA 2/2 embolus from subtotal occlusion in LICA. Vascular surgery saw patient and requested cardiac preop risk stratification prior to possible CEA on 01/18/13.   Today patient is without symptoms or residual defects during interview. He has had no complaints of CP, unusual SOB, or palps since his last cath in 2010. He has not formally exercised in over 2 years, but is relatively active at home. He has approximately 15 stairs in his home that he goes up and down multiple times daily without CP or unusual SOB. He also walks approx 100 yards from his house to the street daily without symptoms. Also active with cleaning his boat throughout the summer and performing day to day maintenance around his home. These activities do not elicit any cardiac symptoms. He has HTN which has been well controlled in the hospital without any meds, though he does take several meds @ home, including metoprolol, nebivolol, maxzide, and lisinopril.  He is unsure as to why he is on 2 beta blockers. He is a current smoker, about 0.75 ppd. He has a 30 ppy history. LDL at this hospitalization is 71 (on 40 mg Atorvastatin). HgbA1c 6.4 at this hospitalization. He has not had an ischemic work up since his  stents were placed in 2010. Echo done on 01/12/13 showed EF 55-60% with no regional WMAs. Troponin on 01/11/13 was WNL.   Inpatient Medications  . aspirin EC  81 mg Oral Daily  . atorvastatin  40 mg Oral Daily  . clopidogrel  75 mg Oral Daily  . heparin  5,000 Units Subcutaneous Q8H   Family History   Both parents are deceased. Father died at 31 with a history of cardiac issues. His mother died at 69, no cardiac issues. No sibs with cardiac issues.   Social History History   Social History  . Marital Status: Legally Separated    Spouse Name: N/A    Number of Children: N/A  . Years of Education: N/A   Occupational History  . Not on file.    Social History Main Topics  . Smoking status: Current Every Day Smoker -- 0.80 packs/day for 30 years    Types: Cigarettes  . Smokeless tobacco: Not on file  . Alcohol Use: Yes     Comment: occ  . Drug Use: No  . Sexual Activity: Not on file   Other Topics Concern  . 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 Spent much of his career working abroad but now is based in [place], where he lives with his fiancee.  He does not routinely exercise but is able to be active around his house without limitations.    Review of Systems  General:  No chills, fever, night sweats or weight changes.  Cardiovascular:  No chest pain, dyspnea on exertion, edema, orthopnea, palpitations, paroxysmal nocturnal dyspnea. Respiratory: No cough, dyspnea Urologic: No hematuria, dysuria Abdominal:   No nausea, vomiting, diarrhea, bright red blood per rectum, melena, or hematemesis Neurologic:  No visual changes, wkns, changes in mental status. All other systems reviewed and are otherwise negative except as noted above.  Physical Exam  Blood pressure 126/74, pulse 54, temperature 97.9 F (36.6 C), temperature source Oral, resp. rate 18, height 6' (1.829 m), weight 231 lb 14.8 oz (105.2 kg), SpO2 94.00%.  General: Pleasant, NAD Psych: Normal affect. Neuro: Alert and oriented X 3. Moves all extremities spontaneously. HEENT: Normal  Neck: No bruits or JVD. Lungs:  Resp regular and unlabored, slight inspiratory crackles at left lower lobe. No wheezes.  Heart: RRR no s3, s4, or murmurs. Abdomen: Soft, non-tender, non-distended, BS + x 4.  Extremities: No edema.  Labs  Recent Labs  01/11/13 2006  TROPONINI <0.30   Lab Results  Component Value Date   WBC 12.7* 01/11/2013   HGB 16.6 01/11/2013   HCT 46.1 01/11/2013   MCV 93.7 01/11/2013   PLT 186 01/11/2013     Recent Labs Lab 01/11/13 2006  NA 137  K 3.8  CL 99  CO2 21  BUN 12  CREATININE 1.07   CALCIUM 9.2  PROT 7.7  BILITOT 0.4  ALKPHOS 80  ALT 36  AST 23  GLUCOSE 151*   Lab Results  Component Value Date   CHOL 149 01/13/2013   HDL 32* 01/13/2013   LDLCALC 71 01/13/2013   TRIG 229* 01/13/2013   Radiology/Studies  Ct Head (brain) Wo Contrast  01/11/2013   CLINICAL DATA:  Headache with right arm numbness.  EXAM: CT HEAD WITHOUT CONTRAST   IMPRESSION: Normal head CT.   Electronically Signed   By: Irish Lack   On: 01/11/2013 21:29   Ct Angio Neck W/cm &/or Wo/cm  01/13/2013   *RADIOLOGY REPORT*  Clinical Data:  Three of recent left cerebral infarcts, evaluate for severe left ICA stenosis.  CT ANGIOGRAPHY NECK IMPRESSION: 1. Short segment high-grade stenosis of greater than 80% and measuring 5 mm in length within the proximal left internal carotid artery, just distal to its takeoff from the carotid bifurcation (series 8061, image 102).  There is irregular calcified and noncalcified atheromatous plaque at the level of the stenosis.  No other high-grade stenosis identified within the left internal carotid artery. 2.  Short segment stenosis of approximately 40% within the proximal right internal carotid artery measuring 1 cm in length ( series 5, image 79). 3.  No high-grade stenosis or other abnormality identified within the vertebral arteries.   Original Report Authenticated By: Rise Mu, M.D.   Francis Brown Wo Contrast  01/12/2013   CLINICAL DATA:  Right-sided weakness. Acute stroke by MRI.  EXAM: MRA HEAD WITHOUT CONTRAST  TECHNIQUE: MRA HEAD WITHOUT CONTRAST  COMPARISON:  01/11/2013  FINDINGS:   IMPRESSION: Normal intracranial Francis angiography of the large and medium size vessels.   Electronically Signed   By: Paulina Fusi M.D.   On: 01/12/2013 10:11   Francis Brain Wo Contrast  01/11/2013   *RADIOLOGY REPORT*  Clinical Data: Headache for 1 month.  Blurred vision.  MRI HEAD WITHOUT IMPRESSION: Multiple punctate left frontotemporal foci of reduced diffusion concerning for  embolic phenomenon in left cerebral artery territory.  No hemorrhagic conversion.  Mild white matter changes suggestive chronic small vessel ischemic disease in addition to remote posterior pontine infarct.  Critical Value/emergent results were called by telephone at the time of interpretation on January 11, 2013 at 2300 hours to Abagail Kitchens, who verbally acknowledged these results.   Original Report Authenticated By: Awilda Metro   2D echocardiogram 01/12/13  - Left ventricle: The cavity size was normal. Systolic function was normal. The estimated ejection fraction was in the range of 55% to 60%. Wall motion was normal; there were no regional wall motion abnormalities. - Left atrium: The atrium was mildly dilated.  ECG  NSR with HR 66. Late anterior transition.  No acute ST/T wave changes.   ASSESSMENT AND PLAN  1.  CAD:  S/p prior CABG in 1998, and PCI in 2010.  He has done well since his last revascularization.  He is relatively active @ home without symptoms or limitations.  He will not require any further ischemic evaluation prior to proceeding with L CEA next Tuesday.  The plan is to continue asa and plavix throughout the perioperative period.  We rec also continuing statin and bb.  Of note, he is on 2 different beta blockers @ home (bystolic and toprol XL).  Given prior h/o difficult to control HTN, rec resumption of bystolic @ 10mg  daily and discontinuation of toprol xl.  As he is relatively bradycardic today, if his BP rises off of the additional BB, rec titration of ACEI.  2.  CVA/CVD:  Pending L CEA next Tuesday.  As above, no further cardiac testing prior to OR.  3.  HTN:  Stable off of all meds while hospitalized.  Resume bystolic, d/c toprol, titrate ACEI if BP elevated as outpt.  4.  HL:  LDL 71.  Cont statin.  5.  Tob Abuse:  Cessation advised.  Signed, Nicolasa Ducking, NP 01/13/2013, 1:27 PM  Patient seen, examined. Available data reviewed. Agree with findings,  assessment, and plan as outlined by Ward Givens, NP. Pt independently interviewed and examined. Exam  reveals alert, oriented, well-appearing gentleman in NAD. Lungs CTA, CV RRR without murmur or gallop, abdomen soft, NT, extremities without edema. We are asked to see him for preop evaluation before carotid endarterectomy. Records, labs, EKG, radiographic data reviewed. He is active without angina. Clearly can achieve over 4 Mets without symptoms. He is at low risk of cardiac complications related to CEA and can proceed without further testing. Please call if perioperative problems arise. He will follow-up as scheduled with Dr Mayford Knife. Otherwise as documented above - I agree with those findings and recommendations.  Tonny Bollman, M.D. 01/13/2013 3:37 PM

## 2013-01-13 NOTE — Discharge Summary (Signed)
Physician Discharge Summary  Francis Brown:[id number] DOB: February 18, 1951 DOA: 01/11/2013  PCP: No primary provider on file.  Admit date: 01/11/2013 Discharge date: 01/13/2013  Time spent: > 35 minutes  Recommendations for Outpatient Follow-up:  1. Please be sure patient follows up with Vascular surgeon Dr. [name] prior to 01/18/13  Discharge Diagnoses:  Principal Problem:   Acute embolic stroke Active Problems:   Hypertension   Discharge Condition: stable  Diet recommendation: heart healthy  Filed Weights   01/11/13 1950 01/12/13 0200  Weight: 104.781 kg (231 lb) 105.2 kg (231 lb 14.8 oz)    History of present illness:  From original HPI: Francis Brown is a 62 y.o. male who presented to [facility] with h/o L sided headache x 1 month and transient episode of R arm paresthesias and weakness. Symptoms of weakness started 2 hours before arriving to ED. Headaches had been going on for the past 4 weeks. Weakness lasted about 10 mins but then spontaneously resolved.  MRI brain done in the ED demonstrates punctate infarctions on the L side c/w embolic stroke. Hospitalist asked to admit to complete stroke work up.  Hospital Course:  TIA in patient with > 80% L ICA stenosis - Vascular surgeon consulted and plans are for evaluation and further recommendations on Tuesday 01/18/13. Plans are to discharge patient and f/u with vascular surgeon.  Discussed with neurology, cardiology, and Vascular surgery group. - Cardiology consulted for cardiac clearance. Discussed with cardiology who does not recommend any further testing from a cardiac standpoint. - Neurology evaluated while in house and recommended continuation of aspirin and clopidogrel  HTN - well controlled off of antihypertensive medications. - Patient has been bradycardic off of his metoprolol as such will continue to hold on discharge.  Procedures:  CT angio of neck  MRI/MRA of head  CT of brain  Consultations:  Cardiology:  [facility]  Neurology: Dr. [name]  Vascular surgery: Dr. [name]  Discharge Exam: Filed Vitals:   01/13/13 1017  BP: 126/74  Pulse: 54  Temp: 97.9 F (36.6 C)  Resp: 18    General: Pt in NAD, Alert and Awake Cardiovascular: RRR, no MRG Respiratory: CTA BL, no wheezes  Discharge Instructions  Discharge Orders   Future Appointments Provider Department Dept Phone   02/17/2013 4:30 PM Quintella Reichert, MD [facility] [phone]   Future Orders Complete By Expires   Call MD for:  extreme fatigue  As directed    Call MD for:  persistant dizziness or light-headedness  As directed    Diet - low sodium heart healthy  As directed    Discharge instructions  As directed    Comments:     You will need to follow up with Vascular Surgeon Dr. [name]. Plan is for operation this upcomming Tuesday, as such you are to call his office prior to Tuesday to make the proper arrangements.   Increase activity slowly  As directed        Medication List    STOP taking these medications       CIALIS 20 MG tablet  Generic drug:  tadalafil     lisinopril 10 MG tablet  Commonly known as:  PRINIVIL,ZESTRIL     metoprolol succinate 25 MG 24 hr tablet  Commonly known as:  TOPROL-XL      TAKE these medications       aspirin EC 81 MG tablet  Take 81 mg by mouth daily.     atorvastatin 40 MG tablet  Commonly known as:  LIPITOR  Take 40 mg by mouth daily.     clopidogrel 75 MG tablet  Commonly known as:  PLAVIX  Take 1 tablet (75 mg total) by mouth daily.     DERMAZINC CREAM Crea  Apply 1 application topically daily.     DERMAZINC SPRAY EX  Apply 1 application topically daily.     nebivolol 10 MG tablet  Commonly known as:  BYSTOLIC  Take 1 tablet (10 mg total) by mouth daily.     nitroGLYCERIN 0.4 MG SL tablet  Commonly known as:  NITROSTAT  Place 1 tablet (0.4 mg total) under the tongue every 5 (five) minutes as needed for chest pain.     triamterene-hydrochlorothiazide  37.5-25 MG per tablet  Commonly known as:  MAXZIDE-25  Take 1 each (1 tablet total) by mouth daily.       No Known Allergies    The results of significant diagnostics from this hospitalization (including imaging, microbiology, ancillary and laboratory) are listed below for reference.    Significant Diagnostic Studies: Ct Head (brain) Wo Contrast  01/11/2013   CLINICAL DATA:  Headache with right arm numbness.  EXAM: CT HEAD WITHOUT CONTRAST  TECHNIQUE: Contiguous axial images were obtained from the base of the skull through the vertex without intravenous contrast.  COMPARISON:  None.  FINDINGS: The brain demonstrates no evidence of hemorrhage, infarction, edema, mass effect, extra-axial fluid collection, hydrocephalus or mass lesion. The skull is unremarkable.  IMPRESSION: Normal head CT.   Electronically Signed   By: Irish Lack   On: 01/11/2013 21:29   Ct Angio Neck W/cm &/or Wo/cm  01/13/2013   *RADIOLOGY REPORT*  Clinical Data:  Three of recent left cerebral infarcts, evaluate for severe left ICA stenosis.  CT ANGIOGRAPHY NECK  Technique:  Multidetector CT imaging of the neck was performed using the standard protocol during bolus administration of intravenous contrast.  Multiplanar CT image reconstructions including MIPs were obtained to evaluate the vascular anatomy. Carotid stenosis measurements (when applicable) are obtained utilizing NASCET criteria, using the distal internal carotid diameter as the denominator.  Contrast: 50mL OMNIPAQUE IOHEXOL 350 MG/ML SOLN  Comparison:  MRI from 01/11/2013  Findings:  Pattern atherosclerotic plaque is noted within the visualized aortic arch.  A bovine arch is noted with the left common carotid and brachiocephalic arteries arising from the common trunk. No high-grade stenosis is seen within the proximal great vessels.  The common carotid arteries are measured in size and caliber without high-grade stenosis.  Scattered calcified and noncalcified  atheromatous disease is seen about the carotid bifurcations.  A short segment high-grade stenosis of greater than 80% is seen within the proximal left internal carotid artery, chest distal to its origin from the carotid bifurcation.  This stenosis is best seen on sagittal sequence (series 8061, image 102).  This stenosis measures approximately 5 mm in length. Calcified and noncalcified plaque is seen at this level.  There is suggestive of a small amount of post stenotic dilatation along the the left internal carotid artery just beyond the stenosis. Distally, of the left internal carotid artery is well opacified without evidence of high- grade stenosis, pseudoaneurysm, or other abnormality.  The left internal carotid artery is seen to the level of the cavernous segment.  On the right, there is mild narrowing of the proximal right internal carotid artery its origin from the carotid bifurcation (series 5, image 79).  The right internal carotid artery is narrowed by approximately 40% at this  level (series 5, image 80). Calcified and noncalcified atheromatous disease is seen at this level.  This stenosis measures approximately 1 cm length. Distally, the right internal carotid artery is well opacified without evidence of high-grade stenosis, pseudoaneurysm, or other abnormality.  The external carotid arteries and their branches are well opacified without evidence of high-grade stenosis or other abnormality.  The vertebral arteries are well opacified without evidence of high- grade stenosis or dissection.  The vertebral arteries are fairly symmetric in size.  No stenosis seen at the origin of either vertebral artery.  The vertebrobasilar junction and visualized basilar artery are within normal limits.  Review of the MIP images confirms the above findings.  The visualized soft tissues of the neck are within normal limits. No mass lesion or loculated fluid collection.  No cervical adenopathy.  The visualized thyroid gland is  within normal limits.  Multilevel degenerative disc disease is seen within the visualized cervical spine, most severe at this C5-6 level.  No acute osseous abnormality identified.  The visualized lung apices are clear.  IMPRESSION: 1. Short segment high-grade stenosis of greater than 80% and measuring 5 mm in length within the proximal left internal carotid artery, just distal to its takeoff from the carotid bifurcation (series 8061, image 102).  There is irregular calcified and noncalcified atheromatous plaque at the level of the stenosis.  No other high-grade stenosis identified within the left internal carotid artery. 2.  Short segment stenosis of approximately 40% within the proximal right internal carotid artery measuring 1 cm in length ( series 5, image 79). 3.  No high-grade stenosis or other abnormality identified within the vertebral arteries.   Original Report Authenticated By: Rise Mu, M.D.   Mr [facility] Wo Contrast  01/12/2013   CLINICAL DATA:  Right-sided weakness. Acute stroke by MRI.  EXAM: MRA HEAD WITHOUT CONTRAST  TECHNIQUE: MRA HEAD WITHOUT CONTRAST  COMPARISON:  01/11/2013  FINDINGS: Both internal carotid arteries are widely patent into the brain. The anterior and middle cerebral vessels are patent without proximal stenosis, aneurysm or vascular malformation. Both vertebral arteries are patent to the basilar. No basilar stenosis. Posterior circulation branch vessels are patent. There are large posterior communicating arteries bilaterally.  IMPRESSION: Normal intracranial MR angiography of the large and medium size vessels.   Electronically Signed   By: Paulina Fusi M.D.   On: 01/12/2013 10:11   Mr Brain Wo Contrast  01/11/2013   *RADIOLOGY REPORT*  Clinical Data: Headache for 1 month.  Blurred vision.  MRI HEAD WITHOUT CONTRAST  Technique:  Multiplanar, multiecho pulse sequences of the brain and surrounding structures were obtained according to standard protocol without  intravenous contrast.  Comparison: CT of head January 11, 2013 at 2119 hours.  Findings: Punctate foci of reduced diffusion within the left frontal and parietal lobes numbering at least four en toto. Minimal FLAIR hyperintense signal.  No susceptibility artifact to suggest hemorrhage.  The ventricles and sulci are normal for patient's age.  A few scattered sub centimeter supratentorial white matter FLAIR T2 hyperintensities are seen, suggesting sequela chronic small vessel ischemic disease, within normal range for patient's age. Superimposed to 9 x 5 mm posterior pontine midline T2 hyperintensity most consistent with each remote ischemia.  No midline shift or mass effect.  No abnormal extra-axial fluid collections.  Normal major intracranial vascular flow voids seen at the skull base.  No suspicious calvarial bone marrow signal.  Craniocervical junction maintained.  Fluid replaced sella is not expanded.  Ocular globes  and orbital contents are not suspicious but not tailored for evaluation.  Under pneumatized frontal sinuses, no paranasal sinus air-fluid levels.  Mastoid air cells are well-aerated.  Mild temporomandibular osteoarthrosis.  IMPRESSION: Multiple punctate left frontotemporal foci of reduced diffusion concerning for embolic phenomenon in left cerebral artery territory.  No hemorrhagic conversion.  Mild white matter changes suggestive chronic small vessel ischemic disease in addition to remote posterior pontine infarct.  Critical Value/emergent results were called by telephone at the time of interpretation on January 11, 2013 at 2300 hours to Abagail Kitchens, who verbally acknowledged these results.   Original Report Authenticated By: Awilda Metro    Microbiology: No results found for this or any previous visit (from the past 240 hour(s)).   Labs: Basic Metabolic Panel:  Recent Labs Lab 01/11/13 2006  NA 137  K 3.8  CL 99  CO2 21  GLUCOSE 151*  BUN 12  CREATININE 1.07  CALCIUM 9.2    Liver Function Tests:  Recent Labs Lab 01/11/13 2006  AST 23  ALT 36  ALKPHOS 80  BILITOT 0.4  PROT 7.7  ALBUMIN 4.1   No results found for this basename: LIPASE, AMYLASE,  in the last 168 hours No results found for this basename: AMMONIA,  in the last 168 hours CBC:  Recent Labs Lab 01/11/13 2006  WBC 12.7*  NEUTROABS 9.9*  HGB 16.6  HCT 46.1  MCV 93.7  PLT 186   Cardiac Enzymes:  Recent Labs Lab 01/11/13 2006  TROPONINI <0.30   BNP: BNP (last 3 results) No results found for this basename: PROBNP,  in the last 8760 hours CBG:  Recent Labs Lab 01/11/13 2011  GLUCAP 146*       Signed:  Penny Pia  [facility] 01/13/2013, 1:30 PM

## 2013-01-13 NOTE — Progress Notes (Signed)
Stroke Team Progress Note  HISTORY Francis Brown is an 62 y.o. male, right handed, with a past medical history significant for HTN, CAD s/p CABG x5 and s/p PTCA, MI, smoking, stroke without residual deficit, presented to [facility] ED with a history of left sided HA x 1 month and transient episode of left arm paresthesias and weakness.   Stated that he never had similar symptoms in the right or left side of his body. He tells me that for the past 4 weeks he has been experiencing daily, nearly constant, mildly intense head pain localized over the left frontal area without associated nausea, vomiting, slurred speech, double vision, language or vision impairment. No recent fever, infection, head or neck trauma.   Tonight, approximately 2 hours before arriving to the ED, he developed acute onset of tingling and numbness from the right shoulder down to his hand, as well as inability to use the right hand due to weakness. The episode lasted for about 10 minutes and completely resolved.   His symptoms prompted CT brain that was unremarkable but MRI-DWI brain disclosed several punctate foci of reduced diffusion within the left frontal and parietal lobes numbering at least four en total.  At this moment, he complains only of mid left head pain but otherwise thinks that he is back to his normal state.  On daily aspirin and plavix   Patient was not a TPA candidate secondary to resolution of symptoms. He was admitted for further evaluation and treatment.  SUBJECTIVE Patient lying in the bed.  Overall he feels his condition is completely resolved. Has headache intermittently over one month. Had a stroke "pin" in 2006. His sx at that time were nausea and vomiting. Stroke was in back of head.  OBJECTIVE Most recent Vital Signs: Filed Vitals:   01/13/13 0153 01/13/13 0513 01/13/13 1017 01/13/13 1330  BP: 108/65 108/64 126/74 124/71  Pulse: 70 52 54 62  Temp:  97.6 F (36.4 C) 97.9 F (36.6 C) 97.8 F (36.6 C)   TempSrc: Oral Oral Oral Oral  Resp: 18 17 18 18   Height:      Weight:      SpO2: 94% 99% 94% 97%   CBG (last 3)   Recent Labs  01/11/13 2011  GLUCAP 146*    IV Fluid Intake:     MEDICATIONS  . aspirin EC  81 mg Oral Daily  . atorvastatin  40 mg Oral Daily  . clopidogrel  75 mg Oral Daily  . heparin  5,000 Units Subcutaneous Q8H   PRN:    Diet:  Cardiac thin liquids Activity:   DVT Prophylaxis:  Heparin SQ  CLINICALLY SIGNIFICANT STUDIES Basic Metabolic Panel:   Recent Labs Lab 01/11/13 2006  NA 137  K 3.8  CL 99  CO2 21  GLUCOSE 151*  BUN 12  CREATININE 1.07  CALCIUM 9.2   Liver Function Tests:   Recent Labs Lab 01/11/13 2006  AST 23  ALT 36  ALKPHOS 80  BILITOT 0.4  PROT 7.7  ALBUMIN 4.1   CBC:   Recent Labs Lab 01/11/13 2006  WBC 12.7*  NEUTROABS 9.9*  HGB 16.6  HCT 46.1  MCV 93.7  PLT 186   Coagulation:   Recent Labs Lab 01/11/13 2006  LABPROT 13.2  INR 1.02   Cardiac Enzymes:   Recent Labs Lab 01/11/13 2006  TROPONINI <0.30   Urinalysis: No results found for this basename: COLORURINE, APPERANCEUR, LABSPEC, PHURINE, GLUCOSEU, HGBUR, BILIRUBINUR, KETONESUR, PROTEINUR, UROBILINOGEN, NITRITE, LEUKOCYTESUR,  in the last 168 hours Lipid Panel    Component Value Date/Time   CHOL 149 01/13/2013 0735   LDL 71  HgbA1C  Lab Results  Component Value Date   HGBA1C 6.4* 01/12/2013    Urine Drug Screen:   No results found for this basename: labopia,  cocainscrnur,  labbenz,  amphetmu,  thcu,  labbarb    Alcohol Level: No results found for this basename: ETH,  in the last 168 hours  Ct Head (brain) Wo Contrast 01/11/2013    Normal head CT.    Mr [name] Head Wo Contrast 01/12/2013  : Normal intracranial MR angiography of the large and medium size vessels.   Mr Brain Wo Contrast 01/11/2013    Multiple punctate left frontotemporal foci of reduced diffusion concerning for embolic phenomenon in left cerebral artery territory.  No  hemorrhagic conversion.  Mild white matter changes suggestive chronic small vessel ischemic disease in addition to remote posterior pontine infarct.    2D Echocardiogram  EF 55%. Wall motion normal, LA dilated.  Carotid Doppler  Right - 1% to 39% ICA stenosis. Vertebral artery flow is antegrade. Left - There is an 80% to 99% ICA stenosis. Vertebral artery flow is antegrade   CXR    EKG  normal sinus rhythm.   Therapy Recommendations    Physical Exam   Head: normocephalic.  Neck: supple, no bruits, no JVD.  Cardiac: no murmurs.  Lungs: clear.  Abdomen: soft, no tender, no mass.  Extremities: no edema Neurological Exam ;  Awake  Alert oriented x 3. Normal speech and language.eye movements full without nystagmus.fundi were not visualized. Vision acuity and fields appear normal. Hearing is normal. Palatal movements are normal. Face symmetric. Tongue midline. Normal strength, tone, reflexes and coordination. Normal sensation. Gait deferred.   ASSESSMENT Francis Brown is a 61 y.o. male presenting with transient right arm hemisensory/hemiparesis. Imaging confirms multiple punctate left frontotemporal foci of reduced diffusion concerning for embolic phenomenon in left cerebral artery territory. Infarct felt to be embolic due to underlying cardiac source or most likely due to subtotal occlusive disease of left internal carotid artery.  On aspirin 81 mg orally every day and clopidogrel 75 mg orally every day prior to admission. Now on aspirin 81 mg orally every day and clopidogrel 75 mg orally every day for secondary stroke prevention. Patient with resultant transient symptoms. Work up underway.   Hypertension  Coronary artery disease, s/p CABG, stents  Symptomatic carotid stenosis, left.  Hyperlipidemia, LDL 71, on statin  Hospital day # 2  TREATMENT/PLAN  Continue aspirin 81 mg orally every day and clopidogrel 75 mg orally every day for secondary stroke prevention. Cardiac  clearance: low risk of cardiac complications related to CEA Scheduled for L CEA on  01/18/2013   Gwendolyn Lima. Manson Passey, [facility], MBA, MHA [facility] Pager: [phone] 01/13/2013 4:17 PM  I have personally obtained a history, examined the patient, evaluated imaging results, and formulated the assessment and plan of care. I agree with the above.  Delia Heady, MD

## 2013-01-13 NOTE — Progress Notes (Signed)
Discharge orders received. Gave patient pre-procedure instructions for next Tuesday -CEA by Dr [name]. Medications and stroke education reviewed with patient and fiancee. Pt verbalized understanding and was instructed to call Dr Nicky Pugh office for any questions regarding his procedure next week. Pt asked about getting anxiety medication ordered and per Dr. Cena Benton he should follow up with his PCP for any new medications.

## 2013-01-14 ENCOUNTER — Encounter (HOSPITAL_COMMUNITY): Payer: Self-pay | Admitting: Pharmacy Technician

## 2013-01-14 NOTE — ED Provider Notes (Addendum)
  I performed a history and physical examination of Francis Brown and discussed his management with Dr. Ladona Ridgel .  I agree with the history, physical, assessment, and plan of care, with the following exceptions: None  I was present for the following procedures: None Time Spent in Critical Care of the patient: None Time spent in discussions with the patient and family: 5  62 y.o. Male with some weakness noted over past weak and headache x 1 month with mr positive for stroke. Patient admitted for further work up and therapy.   Holli Humbles, MD 01/14/13 1919  I have reviewed and agree with resident's ekg interpretation.   Hilario Quarry, MD 02/07/13 [phone]

## 2013-01-17 ENCOUNTER — Encounter (HOSPITAL_COMMUNITY): Payer: Self-pay | Admitting: *Deleted

## 2013-01-17 MED ORDER — DEXTROSE 5 % IV SOLN
1.5000 g | INTRAVENOUS | Status: AC
  Administered 2013-01-18: 1.5 g via INTRAVENOUS
  Filled 2013-01-17: qty 1.5

## 2013-01-18 ENCOUNTER — Telehealth: Payer: Self-pay | Admitting: Vascular Surgery

## 2013-01-18 ENCOUNTER — Encounter (HOSPITAL_COMMUNITY): Payer: Self-pay | Admitting: *Deleted

## 2013-01-18 ENCOUNTER — Inpatient Hospital Stay (HOSPITAL_COMMUNITY): Payer: BC Managed Care – PPO | Admitting: Anesthesiology

## 2013-01-18 ENCOUNTER — Encounter (HOSPITAL_COMMUNITY): Payer: Self-pay | Admitting: Anesthesiology

## 2013-01-18 ENCOUNTER — Inpatient Hospital Stay (HOSPITAL_COMMUNITY): Payer: BC Managed Care – PPO

## 2013-01-18 ENCOUNTER — Inpatient Hospital Stay (HOSPITAL_COMMUNITY)
Admission: RE | Admit: 2013-01-18 | Discharge: 2013-01-19 | DRG: 839 | Disposition: A | Discharge status: Home / Self Care | Payer: BC Managed Care – PPO | Source: Ambulatory Visit | Attending: Vascular Surgery | Admitting: Vascular Surgery

## 2013-01-18 ENCOUNTER — Encounter (HOSPITAL_COMMUNITY)
Admission: RE | Disposition: A | Discharge status: Home / Self Care | Payer: Self-pay | Source: Ambulatory Visit | Attending: Vascular Surgery

## 2013-01-18 DIAGNOSIS — I2510 Atherosclerotic heart disease of native coronary artery without angina pectoris: Secondary | ICD-10-CM | POA: Diagnosis present

## 2013-01-18 DIAGNOSIS — Z8673 Personal history of transient ischemic attack (TIA), and cerebral infarction without residual deficits: Secondary | ICD-10-CM

## 2013-01-18 DIAGNOSIS — Z951 Presence of aortocoronary bypass graft: Secondary | ICD-10-CM

## 2013-01-18 DIAGNOSIS — Z9861 Coronary angioplasty status: Secondary | ICD-10-CM

## 2013-01-18 DIAGNOSIS — I739 Peripheral vascular disease, unspecified: Secondary | ICD-10-CM | POA: Diagnosis present

## 2013-01-18 DIAGNOSIS — I6529 Occlusion and stenosis of unspecified carotid artery: Principal | ICD-10-CM | POA: Diagnosis present

## 2013-01-18 DIAGNOSIS — I10 Essential (primary) hypertension: Secondary | ICD-10-CM | POA: Diagnosis present

## 2013-01-18 DIAGNOSIS — I252 Old myocardial infarction: Secondary | ICD-10-CM

## 2013-01-18 DIAGNOSIS — F17200 Nicotine dependence, unspecified, uncomplicated: Secondary | ICD-10-CM | POA: Diagnosis present

## 2013-01-18 DIAGNOSIS — I6522 Occlusion and stenosis of left carotid artery: Secondary | ICD-10-CM

## 2013-01-18 HISTORY — PX: CAROTID ENDARTERECTOMY: SUR193

## 2013-01-18 HISTORY — PX: ENDARTERECTOMY: SHX5162

## 2013-01-18 LAB — CBC
HCT: 46.6 % (ref 39.0–52.0)
MCHC: 35.4 g/dL (ref 30.0–36.0)
MCV: 94.5 fL (ref 78.0–100.0)
Platelets: 176 10*3/uL (ref 150–400)
RBC: 4.93 MIL/uL (ref 4.22–5.81)
RDW: 14 % (ref 11.5–15.5)
WBC: 11.6 10*3/uL — ABNORMAL HIGH (ref 4.0–10.5)

## 2013-01-18 LAB — BASIC METABOLIC PANEL
CO2: 24 mEq/L (ref 19–32)
Calcium: 9 mg/dL (ref 8.4–10.5)
Chloride: 98 mEq/L (ref 96–112)
Creatinine, Ser: 1.19 mg/dL (ref 0.50–1.35)
GFR calc Af Amer: 75 mL/min — ABNORMAL LOW (ref >90)
GFR calc non Af Amer: 65 mL/min — ABNORMAL LOW (ref >90)
Potassium: 3.8 mEq/L (ref 3.5–5.1)
Sodium: 135 mEq/L (ref 135–145)

## 2013-01-18 LAB — GLUCOSE, CAPILLARY: Glucose-Capillary: 140 mg/dL — ABNORMAL HIGH (ref 70–99)

## 2013-01-18 LAB — SURGICAL PCR SCREEN
MRSA, PCR: NEGATIVE
Staphylococcus aureus: NEGATIVE

## 2013-01-18 LAB — POCT I-STAT 4, (NA,K, GLUC, HGB,HCT): HCT: 51 % (ref 39.0–52.0)

## 2013-01-18 LAB — TYPE AND SCREEN
ABO/RH(D): B POS
Antibody Screen: NEGATIVE

## 2013-01-18 SURGERY — ENDARTERECTOMY, CAROTID
Anesthesia: General | Site: Neck | Laterality: Left | Wound class: Clean

## 2013-01-18 MED ORDER — ONDANSETRON HCL 4 MG/2ML IJ SOLN: 4.0000 mg | Freq: Four times a day (QID) | INTRAMUSCULAR | Status: DC | PRN

## 2013-01-18 MED ORDER — TRIAMTERENE-HCTZ 37.5-25 MG PO TABS
1.0000 | ORAL_TABLET | Freq: Every day | ORAL | Status: DC
  Administered 2013-01-19: 1 via ORAL
  Filled 2013-01-18: qty 1

## 2013-01-18 MED ORDER — THROMBIN 20000 UNITS EX SOLR
CUTANEOUS | Status: DC | PRN
  Administered 2013-01-18: 10:00:00 via TOPICAL

## 2013-01-18 MED ORDER — ACETAMINOPHEN 650 MG RE SUPP: 325.0000 mg | RECTAL | Status: DC | PRN

## 2013-01-18 MED ORDER — PANTOPRAZOLE SODIUM 40 MG PO TBEC
40.0000 mg | DELAYED_RELEASE_TABLET | Freq: Every day | ORAL | Status: DC
  Administered 2013-01-18 – 2013-01-19 (×2): 40 mg via ORAL
  Filled 2013-01-18 (×2): qty 1

## 2013-01-18 MED ORDER — SODIUM CHLORIDE 0.9 % IV SOLN: INTRAVENOUS | Status: DC

## 2013-01-18 MED ORDER — INFLUENZA VAC SPLIT QUAD 0.5 ML IM SUSP
0.5000 mL | INTRAMUSCULAR | Status: AC
  Administered 2013-01-19: 0.5 mL via INTRAMUSCULAR
  Filled 2013-01-18: qty 0.5

## 2013-01-18 MED ORDER — PNEUMOCOCCAL VAC POLYVALENT 25 MCG/0.5ML IJ INJ
0.5000 mL | INJECTION | INTRAMUSCULAR | Status: AC
  Administered 2013-01-19: 0.5 mL via INTRAMUSCULAR
  Filled 2013-01-18: qty 0.5

## 2013-01-18 MED ORDER — OXYCODONE HCL 5 MG PO TABS
5.0000 mg | ORAL_TABLET | Freq: Once | ORAL | Status: AC | PRN
  Administered 2013-01-18: 5 mg via ORAL

## 2013-01-18 MED ORDER — DOPAMINE-DEXTROSE 3.2-5 MG/ML-% IV SOLN: 3.0000 ug/kg/min | INTRAVENOUS | Status: DC

## 2013-01-18 MED ORDER — ATORVASTATIN CALCIUM 40 MG PO TABS
40.0000 mg | ORAL_TABLET | Freq: Every day | ORAL | Status: DC
  Administered 2013-01-18: 40 mg via ORAL
  Filled 2013-01-18 (×2): qty 1

## 2013-01-18 MED ORDER — ACETAMINOPHEN 325 MG PO TABS: 325.0000 mg | ORAL_TABLET | ORAL | Status: DC | PRN

## 2013-01-18 MED ORDER — MUPIROCIN 2 % EX OINT
TOPICAL_OINTMENT | CUTANEOUS | Status: AC
  Filled 2013-01-18: qty 22

## 2013-01-18 MED ORDER — MORPHINE SULFATE 2 MG/ML IJ SOLN: 2.0000 mg | INTRAMUSCULAR | Status: DC | PRN

## 2013-01-18 MED ORDER — DEXTRAN 40 IN SALINE 10-0.9 % IV SOLN
INTRAVENOUS | Status: AC
  Filled 2013-01-18: qty 500

## 2013-01-18 MED ORDER — METOPROLOL TARTRATE 1 MG/ML IV SOLN: 2.0000 mg | INTRAVENOUS | Status: DC | PRN

## 2013-01-18 MED ORDER — MUPIROCIN 2 % EX OINT
TOPICAL_OINTMENT | Freq: Two times a day (BID) | CUTANEOUS | Status: DC
  Administered 2013-01-18: 07:00:00 via NASAL

## 2013-01-18 MED ORDER — HEPARIN SODIUM (PORCINE) 1000 UNIT/ML IJ SOLN
INTRAMUSCULAR | Status: DC | PRN
  Administered 2013-01-18: 8000 [IU] via INTRAVENOUS

## 2013-01-18 MED ORDER — DOCUSATE SODIUM 100 MG PO CAPS
100.0000 mg | ORAL_CAPSULE | Freq: Every day | ORAL | Status: DC
  Administered 2013-01-19: 100 mg via ORAL
  Filled 2013-01-18: qty 1

## 2013-01-18 MED ORDER — FENTANYL CITRATE 0.05 MG/ML IJ SOLN
25.0000 ug | INTRAMUSCULAR | Status: DC | PRN
  Administered 2013-01-18: 25 ug via INTRAVENOUS

## 2013-01-18 MED ORDER — NITROGLYCERIN 0.4 MG SL SUBL: 0.4000 mg | SUBLINGUAL_TABLET | SUBLINGUAL | Status: DC | PRN

## 2013-01-18 MED ORDER — GLYCOPYRROLATE 0.2 MG/ML IJ SOLN
INTRAMUSCULAR | Status: DC | PRN
  Administered 2013-01-18: .8 mg via INTRAVENOUS

## 2013-01-18 MED ORDER — LIDOCAINE HCL (CARDIAC) 20 MG/ML IV SOLN
INTRAVENOUS | Status: DC | PRN
  Administered 2013-01-18: 80 mg via INTRAVENOUS

## 2013-01-18 MED ORDER — 0.9 % SODIUM CHLORIDE (POUR BTL) OPTIME
TOPICAL | Status: DC | PRN
  Administered 2013-01-18: 2000 mL

## 2013-01-18 MED ORDER — OXYCODONE HCL 5 MG PO TABS
5.0000 mg | ORAL_TABLET | ORAL | Status: DC | PRN
  Administered 2013-01-18: 10 mg via ORAL
  Administered 2013-01-18 (×2): 5 mg via ORAL
  Administered 2013-01-19: 10 mg via ORAL
  Filled 2013-01-18 (×2): qty 1
  Filled 2013-01-18 (×2): qty 2

## 2013-01-18 MED ORDER — OXYCODONE HCL 5 MG PO TABS
ORAL_TABLET | ORAL | Status: AC
  Filled 2013-01-18: qty 1

## 2013-01-18 MED ORDER — POTASSIUM CHLORIDE CRYS ER 20 MEQ PO TBCR: 20.0000 meq | EXTENDED_RELEASE_TABLET | Freq: Once | ORAL | Status: AC | PRN

## 2013-01-18 MED ORDER — ENOXAPARIN SODIUM 40 MG/0.4ML ~~LOC~~ SOLN
40.0000 mg | SUBCUTANEOUS | Status: DC
  Filled 2013-01-18: qty 0.4

## 2013-01-18 MED ORDER — ASPIRIN EC 81 MG PO TBEC
81.0000 mg | DELAYED_RELEASE_TABLET | Freq: Every day | ORAL | Status: DC
  Administered 2013-01-19: 81 mg via ORAL
  Filled 2013-01-18: qty 1

## 2013-01-18 MED ORDER — FENTANYL CITRATE 0.05 MG/ML IJ SOLN
INTRAMUSCULAR | Status: AC
  Filled 2013-01-18: qty 2

## 2013-01-18 MED ORDER — MIDAZOLAM HCL 5 MG/5ML IJ SOLN
INTRAMUSCULAR | Status: DC | PRN
  Administered 2013-01-18: 1 mg via INTRAVENOUS

## 2013-01-18 MED ORDER — LACTATED RINGERS IV SOLN
INTRAVENOUS | Status: DC | PRN
  Administered 2013-01-18 (×2): via INTRAVENOUS

## 2013-01-18 MED ORDER — DEXTROSE 5 % IV SOLN
1.5000 g | Freq: Two times a day (BID) | INTRAVENOUS | Status: AC
  Administered 2013-01-18 – 2013-01-19 (×2): 1.5 g via INTRAVENOUS
  Filled 2013-01-18 (×2): qty 1.5

## 2013-01-18 MED ORDER — LISINOPRIL 10 MG PO TABS
10.0000 mg | ORAL_TABLET | Freq: Every day | ORAL | Status: DC
  Administered 2013-01-19: 10 mg via ORAL
  Filled 2013-01-18: qty 1

## 2013-01-18 MED ORDER — SODIUM CHLORIDE 0.9 % IV SOLN
500.0000 mL | Freq: Once | INTRAVENOUS | Status: AC | PRN
  Administered 2013-01-18: 500 mL via INTRAVENOUS

## 2013-01-18 MED ORDER — SODIUM CHLORIDE 0.9 % IV SOLN
INTRAVENOUS | Status: DC
  Administered 2013-01-18: 13:00:00 via INTRAVENOUS

## 2013-01-18 MED ORDER — DEXTRAN 40 IN SALINE 10-0.9 % IV SOLN
INTRAVENOUS | Status: DC | PRN
  Administered 2013-01-18: 500 mL

## 2013-01-18 MED ORDER — GUAIFENESIN-DM 100-10 MG/5ML PO SYRP: 15.0000 mL | ORAL_SOLUTION | ORAL | Status: DC | PRN

## 2013-01-18 MED ORDER — NEBIVOLOL HCL 10 MG PO TABS
10.0000 mg | ORAL_TABLET | Freq: Every day | ORAL | Status: DC
Start: 2013-01-19 — End: 2013-01-19
  Filled 2013-01-18: qty 1

## 2013-01-18 MED ORDER — LIDOCAINE HCL (PF) 1 % IJ SOLN
INTRAMUSCULAR | Status: AC
  Filled 2013-01-18: qty 30

## 2013-01-18 MED ORDER — SODIUM CHLORIDE 0.9 % IR SOLN
Status: DC | PRN
  Administered 2013-01-18: 08:00:00

## 2013-01-18 MED ORDER — ALUM & MAG HYDROXIDE-SIMETH 200-200-20 MG/5ML PO SUSP: 15.0000 mL | ORAL | Status: DC | PRN

## 2013-01-18 MED ORDER — FENTANYL CITRATE 0.05 MG/ML IJ SOLN
INTRAMUSCULAR | Status: DC | PRN
  Administered 2013-01-18: 200 ug via INTRAVENOUS

## 2013-01-18 MED ORDER — CLOPIDOGREL BISULFATE 75 MG PO TABS
75.0000 mg | ORAL_TABLET | Freq: Every day | ORAL | Status: DC
  Administered 2013-01-19: 75 mg via ORAL
  Filled 2013-01-18 (×2): qty 1

## 2013-01-18 MED ORDER — PROPOFOL 10 MG/ML IV BOLUS
INTRAVENOUS | Status: DC | PRN
  Administered 2013-01-18: 200 mg via INTRAVENOUS

## 2013-01-18 MED ORDER — ROCURONIUM BROMIDE 100 MG/10ML IV SOLN
INTRAVENOUS | Status: DC | PRN
  Administered 2013-01-18: 10 mg via INTRAVENOUS
  Administered 2013-01-18: 20 mg via INTRAVENOUS
  Administered 2013-01-18: 50 mg via INTRAVENOUS

## 2013-01-18 MED ORDER — LABETALOL HCL 5 MG/ML IV SOLN: 10.0000 mg | INTRAVENOUS | Status: DC | PRN

## 2013-01-18 MED ORDER — THROMBIN 20000 UNITS EX SOLR
CUTANEOUS | Status: AC
  Filled 2013-01-18: qty 20000

## 2013-01-18 MED ORDER — PHENOL 1.4 % MT LIQD: 1.0000 | OROMUCOSAL | Status: DC | PRN

## 2013-01-18 MED ORDER — NEOSTIGMINE METHYLSULFATE 1 MG/ML IJ SOLN
INTRAMUSCULAR | Status: DC | PRN
  Administered 2013-01-18: 4 mg via INTRAVENOUS

## 2013-01-18 MED ORDER — HYDRALAZINE HCL 20 MG/ML IJ SOLN: 10.0000 mg | INTRAMUSCULAR | Status: DC | PRN

## 2013-01-18 MED ORDER — PROTAMINE SULFATE 10 MG/ML IV SOLN
INTRAVENOUS | Status: DC | PRN
  Administered 2013-01-18 (×2): 10 mg via INTRAVENOUS

## 2013-01-18 MED ORDER — PHENYLEPHRINE HCL 10 MG/ML IJ SOLN
10.0000 mg | INTRAVENOUS | Status: DC | PRN
  Administered 2013-01-18: 15 ug/min via INTRAVENOUS

## 2013-01-18 MED ORDER — OXYCODONE HCL 5 MG/5ML PO SOLN
5.0000 mg | Freq: Once | ORAL | Status: AC | PRN
Start: 2013-01-18 — End: 2013-01-18

## 2013-01-18 MED ORDER — OXYCODONE HCL 5 MG PO TABS: 5.0000 mg | ORAL_TABLET | Freq: Four times a day (QID) | ORAL | Status: DC | PRN

## 2013-01-18 SURGICAL SUPPLY — 56 items
ADH SKN CLS APL DERMABOND .7 (GAUZE/BANDAGES/DRESSINGS) ×1
ADH SKN CLS LQ APL DERMABOND (GAUZE/BANDAGES/DRESSINGS) ×1
ADPR TBG 2 MALE LL ART (MISCELLANEOUS)
BAG DECANTER FOR FLEXI CONT (MISCELLANEOUS) ×2 IMPLANT
CANISTER SUCTION 2500CC (MISCELLANEOUS) ×2 IMPLANT
CATH ROBINSON RED A/P 18FR (CATHETERS) ×2 IMPLANT
CATH SUCT 10FR WHISTLE TIP (CATHETERS) ×2 IMPLANT
CLIP TI MEDIUM 24 (CLIP) ×1 IMPLANT
CLIP TI WIDE RED SMALL 24 (CLIP) ×1 IMPLANT
COVER PROBE W GEL 5X96 (DRAPES) ×1 IMPLANT
COVER SURGICAL LIGHT HANDLE (MISCELLANEOUS) ×2 IMPLANT
CRADLE DONUT ADULT HEAD (MISCELLANEOUS) ×2 IMPLANT
DERMABOND ADHESIVE PROPEN (GAUZE/BANDAGES/DRESSINGS) ×1
DERMABOND ADVANCED (GAUZE/BANDAGES/DRESSINGS) ×1
DERMABOND ADVANCED .7 DNX12 (GAUZE/BANDAGES/DRESSINGS) ×1 IMPLANT
DERMABOND ADVANCED .7 DNX6 (GAUZE/BANDAGES/DRESSINGS) IMPLANT
DRAIN TLS ROUND 10FR (DRAIN) ×1 IMPLANT
DRAPE WARM FLUID 44X44 (DRAPE) ×2 IMPLANT
ELECT REM PT RETURN 9FT ADLT (ELECTROSURGICAL) ×2
ELECTRODE REM PT RTRN 9FT ADLT (ELECTROSURGICAL) ×1 IMPLANT
GLOVE BIO SURGEON STRL SZ7 (GLOVE) ×2 IMPLANT
GLOVE BIOGEL PI IND STRL 7.5 (GLOVE) ×1 IMPLANT
GLOVE BIOGEL PI INDICATOR 7.5 (GLOVE) ×1
GLOVE SURG SS PI 6.5 STRL IVOR (GLOVE) ×1 IMPLANT
GLOVE SURG SS PI 7.0 STRL IVOR (GLOVE) ×2 IMPLANT
GOWN SRG XL XLNG 56XLVL 4 (GOWN DISPOSABLE) IMPLANT
GOWN STRL NON-REIN LRG LVL3 (GOWN DISPOSABLE) ×6 IMPLANT
GOWN STRL NON-REIN XL XLG LVL4 (GOWN DISPOSABLE) ×2
IV ADAPTER SYR DOUBLE MALE LL (MISCELLANEOUS) IMPLANT
KIT BASIN OR (CUSTOM PROCEDURE TRAY) ×2 IMPLANT
KIT ROOM TURNOVER OR (KITS) ×2 IMPLANT
NS IRRIG 1000ML POUR BTL (IV SOLUTION) ×4 IMPLANT
PACK CAROTID (CUSTOM PROCEDURE TRAY) ×2 IMPLANT
PAD ARMBOARD 7.5X6 YLW CONV (MISCELLANEOUS) ×4 IMPLANT
PATCH VASCULAR VASCU GUARD 1X6 (Vascular Products) ×2 IMPLANT
SET COLLECT BLD 21X3/4 12 PB (MISCELLANEOUS) IMPLANT
SHUNT CAROTID BYPASS 10 (VASCULAR PRODUCTS) ×1 IMPLANT
SHUNT CAROTID BYPASS 12FRX15.5 (VASCULAR PRODUCTS) IMPLANT
SPONGE SURGIFOAM ABS GEL 100 (HEMOSTASIS) ×1 IMPLANT
STOPCOCK 4 WAY LG BORE MALE ST (IV SETS) IMPLANT
SUT ETHILON 3 0 PS 1 (SUTURE) IMPLANT
SUT MNCRL AB 4-0 PS2 18 (SUTURE) ×2 IMPLANT
SUT PROLENE 6 0 BV (SUTURE) ×3 IMPLANT
SUT PROLENE 7 0 BV 1 (SUTURE) ×1 IMPLANT
SUT SILK 3 0 TIES 17X18 (SUTURE)
SUT SILK 3-0 18XBRD TIE BLK (SUTURE) IMPLANT
SUT VIC AB 3-0 SH 27 (SUTURE) ×2
SUT VIC AB 3-0 SH 27X BRD (SUTURE) ×1 IMPLANT
SYR TB 1ML LUER SLIP (SYRINGE) IMPLANT
SYSTEM CHEST DRAIN TLS 7FR (DRAIN) IMPLANT
TOWEL OR 17X24 6PK STRL BLUE (TOWEL DISPOSABLE) ×2 IMPLANT
TOWEL OR 17X26 10 PK STRL BLUE (TOWEL DISPOSABLE) ×2 IMPLANT
TUBE FEEDING 8FR 16IN STR KANG (MISCELLANEOUS) ×1 IMPLANT
TUBING ART PRESS 48 MALE/FEM (TUBING) IMPLANT
TUBING EXTENTION W/L.L. (IV SETS) IMPLANT
WATER STERILE IRR 1000ML POUR (IV SOLUTION) ×2 IMPLANT

## 2013-01-18 NOTE — Telephone Encounter (Addendum)
Message copied by Rosalyn Charters on Tue Jan 18, 2013  4:23 PM ------      Message from: Sharee Pimple      Created: Tue Jan 18, 2013 11:05 AM      Regarding: schedule                   ----- Message -----         From: Dara Lords, PA-C         Sent: 01/18/2013  10:33 AM           To: Sharee Pimple, CMA            S/p left CEA 01/18/13.  F/u with Dr. [name] in 2 weeks.            Thanks,      Lelon Mast ------  notified patient of post  op appt. on 02-04-13 at 9:15

## 2013-01-18 NOTE — Anesthesia Procedure Notes (Signed)
Procedure Name: Intubation Date/Time: 01/18/2013 8:02 AM Performed by: Gwenyth Allegra Pre-anesthesia Checklist: Patient identified, Timeout performed, Emergency Drugs available, Suction available and Patient being monitored Patient Re-evaluated:Patient Re-evaluated prior to inductionOxygen Delivery Method: Circle system utilized Preoxygenation: Pre-oxygenation with 100% oxygen Intubation Type: IV induction Ventilation: Mask ventilation without difficulty and Oral airway inserted - appropriate to patient size Laryngoscope Size: Mac and 4 Grade View: Grade I Tube size: 8.0 mm Number of attempts: 1 Airway Equipment and Method: Stylet Secured at: 22 cm Tube secured with: Tape Dental Injury: Teeth and Oropharynx as per pre-operative assessment

## 2013-01-18 NOTE — Preoperative (Signed)
Beta Blockers   Reason not to administer Beta Blockers:Not Applicable 

## 2013-01-18 NOTE — Anesthesia Postprocedure Evaluation (Signed)
  Anesthesia Post-op Note  Patient: Francis Brown  Procedure(s) Performed: Procedure(s): LEFT CAROTID ENDARTERECTOMY WITH PATCH ANGIOPLASTY (Left)  Patient Location: PACU  Anesthesia Type:General  Level of Consciousness: awake, alert  and oriented  Airway and Oxygen Therapy: Patient Spontanous Breathing and Patient connected to nasal cannula oxygen  Post-op Pain: mild  Post-op Assessment: Post-op Vital signs reviewed, Patient's Cardiovascular Status Stable, Respiratory Function Stable, Patent Airway, No signs of Nausea or vomiting and Pain level controlled  Post-op Vital Signs: stable  Complications: No apparent anesthesia complications

## 2013-01-18 NOTE — Anesthesia Preprocedure Evaluation (Signed)
Anesthesia Evaluation  Patient identified by MRN, date of birth, ID band Patient awake    Reviewed: Allergy & Precautions, H&P , NPO status , Patient's Chart, lab work & pertinent test results  Airway Mallampati: II  Neck ROM: full    Dental   Pulmonary Current Smoker,          Cardiovascular hypertension, + CAD, + Past MI, + Cardiac Stents, + CABG and + Peripheral Vascular Disease     Neuro/Psych CVA    GI/Hepatic   Endo/Other  obese  Renal/GU      Musculoskeletal   Abdominal   Peds  Hematology   Anesthesia Other Findings   Reproductive/Obstetrics                           Anesthesia Physical Anesthesia Plan  ASA: III  Anesthesia Plan: General   Post-op Pain Management:    Induction: Intravenous  Airway Management Planned: Oral ETT  Additional Equipment: Arterial line  Intra-op Plan:   Post-operative Plan: Extubation in OR  Informed Consent: I have reviewed the patients History and Physical, chart, labs and discussed the procedure including the risks, benefits and alternatives for the proposed anesthesia with the patient or authorized representative who has indicated his/her understanding and acceptance.     Plan Discussed with: CRNA, Anesthesiologist and Surgeon  Anesthesia Plan Comments:         Anesthesia Quick Evaluation

## 2013-01-18 NOTE — Consult Note (Signed)
[facility] NOTE   MRN : [id number]   Reason for Consult: TIA in pt with >80% L ICA stenosis   Referring Physician: Dr [name]   History of Present Illness: Francis Brown is a 62 y.o. male with PMH, MI, CABG (1998) and coronary stenting, Smoker, borderline DM and PAD who states that yesterday he developed sudden onset of right UE numbness and weakness which resolved in 10 minutes. He had had a left frontal H/A for several days with visual disturbances prior to this - "cloudy" vision in the left eye. All of these symptoms have completely resolved including the H/A. He states he was told he had a "TIA" in 2005 with nausea,vomiting with MRA of neck showing moderate stenosis of Left ICA. CT head at that time Negative for CVA.   Past Medical History   Diagnosis  Date   .  Hypertension    .  Coronary artery disease    .  Stroke  2005   .  History of percutaneous coronary intervention  10/03/2008     Xience 4.0x12 OM, Xience 4.0x8 OM in [place]   .  Myocardial infarct, old  66     happened in [place] - medical therapy   .  Myocardial infarct, old  24     happened in [place], came back and had CABG   .  S/P CABG x 5  1998    Past Surgical History   Procedure  Laterality  Date   .  Cardiac surgery     .  Coronary stent placement     Social History  History   Substance Use Topics   .  Smoking status:  Current Every Day Smoker -- 0.80 packs/day for 30 years     Types:  Cigarettes   .  Smokeless tobacco:  Not on file   .  Alcohol Use:  Yes      Comment: occ   Family History  Father had HX HTN, CAD - died at age 21  No Known Allergies  Current Facility-Administered Medications   Medication  Dose  Route  Frequency  Provider  Last Rate  Last Dose   .  aspirin EC tablet 81 mg  81 mg  Oral  Daily  Wyatt Portela, MD   81 mg at 01/12/13 1012   .  atorvastatin (LIPITOR) tablet 40 mg  40 mg  Oral  Daily  Hillary Bow, DO   40 mg at 01/12/13 1012   .   clopidogrel (PLAVIX) tablet 75 mg  75 mg  Oral  Daily  Hillary Bow, DO   75 mg at 01/12/13 0753   .  heparin injection 5,000 Units  5,000 Units  Subcutaneous  Q8H  Hillary Bow, DO   5,000 Units at 01/12/13 4098   Pt meds include:  Statin :Yes  Betablocker: Yes  ASA: Yes  Other anticoagulants/antiplatelets: plavix   REVIEW OF SYSTEMS  General: [ ]  Weight loss, [ ]  Fever, [ ]  chills  Neurologic: [ ]  Dizziness, [ ]  Blackouts, [ ]  Seizure  [ ]  Stroke, [x ] "Mini stroke", [ ]  Slurred speech, [ ]  Temporary blindness; [x ] weakness in arms or legs, [ ]  Hoarseness [ ]  Dysphagia  Cardiac: [ ]  Chest pain/pressure, [ ]  Shortness of breath at rest [ ]  Shortness of breath with exertion, [ ]  Atrial fibrillation or irregular heartbeat  Vascular: [ ]  Pain in legs with walking, [ ]   Pain in legs at rest, [ ]  Pain in legs at night,  [ ]  Non-healing ulcer, [ ]  Blood clot in vein/DVT,  Pulmonary: [ ]  Home oxygen, [ ]  Productive cough, [ ]  Coughing up blood, [ ]  Asthma,  [ ]  Wheezing [ ]  COPD  Musculoskeletal: [ ]  Arthritis, [ ]  Low back pain, [ ]  Joint pain  Hematologic: [ ]  Easy Bruising, [ ]  Anemia; [ ]  Hepatitis  Gastrointestinal: [ ]  Blood in stool, [ ]  Gastroesophageal Reflux/heartburn,  Urinary: [ ]  chronic Kidney disease, [ ]  on HD - [ ]  MWF or [ ]  TTHS, [ ]  Burning with urination, [ ]  Difficulty urinating  Skin: [ ]  Rashes, [ ]  Wounds  Psychological: [ ]  Anxiety, [ ]  Depression   Physical Examination  Filed Vitals:    01/12/13 0500  01/12/13 0819  01/12/13 1000  01/12/13 1414   BP:  112/56  125/68  127/69  126/68   Pulse:  59  56  54  57   Temp:  98 F (36.7 C)  98.2 F (36.8 C)  98.3 F (36.8 C)  98.1 F (36.7 C)   TempSrc:  Oral  Oral  Oral  Oral   Resp:  18  18  18  18    Height:       Weight:       SpO2:  97%  97%  94%  98%   Body mass index is 31.45 kg/(m^2).  General: WDWN in NAD  Gait: Normal  HENT: WNL  Eyes: Pupils equal  Pulmonary: normal non-labored breathing ,  without Rales, rhonchi, wheezing  Cardiac: RRR, without Murmurs, rubs or gallops;  No carotid bruits bilat.  Abdomen: soft, NT, no masses  Skin: no rashes, ulcers noted; no Gangrene , no cellulitis; no open wounds;  Vascular Exam/Pulses: 3+ Radial pulses bilaterally  DP/PT pulses are not palpable  BLE are warm with normal color, motion and sensation  Musculoskeletal: no muscle wasting or atrophy; no edema  Neurologic: A&O X 3; Appropriate Affect ;  SENSATION: normal;  MOTOR FUNCTION: 5/5 Symmetric  Speech is fluent/normal  No tongue deviation or facial droop noted  Lymph: no LAD  Psych: judgment intact, mood and affect appropriate for clinical situation   Significant Diagnostic Studies:  CBC  Lab Results   Component  Value  Date    WBC  12.7*  01/11/2013    HGB  16.6  01/11/2013    HCT  46.1  01/11/2013    MCV  93.7  01/11/2013    PLT  186  01/11/2013   BMET    Component  Value  Date/Time    NA  137  01/11/2013 2006    K  3.8  01/11/2013 2006    CL  99  01/11/2013 2006    CO2  21  01/11/2013 2006    GLUCOSE  151*  01/11/2013 2006    BUN  12  01/11/2013 2006    CREATININE  1.07  01/11/2013 2006    CALCIUM  9.2  01/11/2013 2006    GFRNONAA  74*  01/11/2013 2006    GFRAA  85*  01/11/2013 2006   Estimated Creatinine Clearance: 92 ml/min (by C-G formula based on Cr of 1.07).  COAG  Lab Results   Component  Value  Date    INR  1.02  01/11/2013    Non-Invasive Vascular Imaging: Carotid duplex completed. 01/12/2013  Preliminary report: Right - 1% to 39% ICA stenosis. Vertebral artery flow  is antegrade. Left - There is an 80% to 99% ICA stenosis. Vertebral artery flow is antegrade.  Radiology:   Ct Head (brain) Wo Contrast  01/11/2013 CLINICAL DATA: Headache with right arm numbness. EXAM: CT HEAD WITHOUT CONTRAST TECHNIQUE: Contiguous axial images were obtained from the base of the skull through the vertex without intravenous contrast. COMPARISON: None. FINDINGS: The brain demonstrates no  evidence of hemorrhage, infarction, edema, mass effect, extra-axial fluid collection, hydrocephalus or mass lesion. The skull is unremarkable. IMPRESSION: Normal head CT. Electronically Signed By: Irish Lack On: 01/11/2013 21:29   Mr [name] Head Wo Contrast  01/12/2013 CLINICAL DATA: Right-sided weakness. Acute stroke by MRI. EXAM: MRA HEAD WITHOUT CONTRAST TECHNIQUE: MRA HEAD WITHOUT CONTRAST COMPARISON: 01/11/2013 FINDINGS: Both internal carotid arteries are widely patent into the brain. The anterior and middle cerebral vessels are patent without proximal stenosis, aneurysm or vascular malformation. Both vertebral arteries are patent to the basilar. No basilar stenosis. Posterior circulation branch vessels are patent. There are large posterior communicating arteries bilaterally. IMPRESSION: Normal intracranial MR angiography of the large and medium size vessels. Electronically Signed By: Paulina Fusi M.D. On: 01/12/2013 10:11  Mr Brain Wo Contrast  01/11/2013 *RADIOLOGY REPORT* Clinical Data: Headache for 1 month. Blurred vision. MRI HEAD WITHOUT CONTRAST Technique: Multiplanar, multiecho pulse sequences of the brain and surrounding structures were obtained according to standard protocol without intravenous contrast. Comparison: CT of head January 11, 2013 at 2119 hours. Findings: Punctate foci of reduced diffusion within the left frontal and parietal lobes numbering at least four en toto. Minimal FLAIR hyperintense signal. No susceptibility artifact to suggest hemorrhage. The ventricles and sulci are normal for patient's age. A few scattered sub centimeter supratentorial white matter FLAIR T2 hyperintensities are seen, suggesting sequela chronic small vessel ischemic disease, within normal range for patient's age. Superimposed to 9 x 5 mm posterior pontine midline T2 hyperintensity most consistent with each remote ischemia. No midline shift or mass effect. No abnormal extra-axial fluid collections. Normal  major intracranial vascular flow voids seen at the skull base. No suspicious calvarial bone marrow signal. Craniocervical junction maintained. Fluid replaced sella is not expanded. Ocular globes and orbital contents are not suspicious but not tailored for evaluation. Under pneumatized frontal sinuses, no paranasal sinus air-fluid levels. Mastoid air cells are well-aerated. Mild temporomandibular osteoarthrosis. IMPRESSION: Multiple punctate left frontotemporal foci of reduced diffusion concerning for embolic phenomenon in left cerebral artery territory. No hemorrhagic conversion. Mild white matter changes suggestive chronic small vessel ischemic disease in addition to remote posterior pontine infarct. Critical Value/emergent results were called by telephone at the time of interpretation on January 11, 2013 at 2300 hours to Abagail Kitchens, who verbally acknowledged these results. Original Report Authenticated By: Awilda Metro   ASSESSMENT/PLAN: Francis Brown is a 62 y.o. male with Hx CAD and PAD who had TIA yesterday with all symptoms resolved. Pt had evidence of moderate Left ICA stenosis in 2005. Carotid duplex done today showed 80-99% Left ICA stenosis. Pt symptoms correspond with left side stenosis. Will get CTA of neck to confirm carotid duplex and to assess position of carotid bifurcation in the neck. Timing and need for left CEA will be decided once CTA is reviewed. Continue ASA. Pt will need cardiac clearance for surgery. He see Dr. Mayford Knife with [facility] cardiology.   ROCZNIAK,REGINA J  01/12/2013  4:17 PM   Addendum  I have independently interviewed and examined the patient, and I agree with the physician assistant's findings. Pt's sx correspond to disease on  carotid duplex. I have ordered CTA neck to confirm degree of stenosis and help determine if the disease is surgically accessible and if aortic arch is compatible with CAS. I suspect this patient's disease will be >80% given his px and prior hx.  Maximal medical mgmt as currently ordered. Preop cardiac risk stratification with [facility] to help determine if acceptable risk for CEA vs CAS if poor cardiac risk. Given minimal disease on MRI, would proceed with L CEA on this coming Tuesday (first available spot) if cardiac risk acceptable.   Leonides Sake, MD  [facility] [place]  Office: [phone]  Pager: [phone]  01/12/2013, 5:00 PM

## 2013-01-18 NOTE — Op Note (Signed)
OPERATIVE NOTE  PROCEDURE:   1.  left carotid endarterectomy with bovine patch angioplasty 2.  left intraoperative carotid ultrasound  PRE-OPERATIVE DIAGNOSIS: left symptomatic carotid stenosis >80 %  POST-OPERATIVE DIAGNOSIS: same as above   SURGEON: Leonides Sake, MD  ASSISTANT(S): Doreatha Massed, PAC   ANESTHESIA: general  ESTIMATED BLOOD LOSS: 100 cc  FINDING(S): 1.  Continuous doppler audible flow signatures are appropriate for each carotid artery. 2.  No evidence of intimal flap visualized on transverse or longitudinal ultrasonography. 3.  Calcified carotid plaque.  SPECIMEN(S):  Carotid plaque (sent to Pathology)  INDICATIONS:   Francis Brown is a 62 y.o. male who presents with left symptomatic carotid stenosis >80%.  I discussed with the patient the risks, benefits, and alternatives to carotid endarterectomy.  I discussed the procedural details of carotid endarterectomy with the patient.  The patient is aware that the risks of carotid endarterectomy include but are not limited to: bleeding, infection, stroke, myocardial infarction, death, cranial nerve injuries both temporary and permanent, neck hematoma, possible airway compromise, labile blood pressure post-operatively, cerebral hyperperfusion syndrome, and possible need for additional interventions in the future. The patient is aware of the risks and agrees to proceed forward with the procedure.  DESCRIPTION: After full informed written consent was obtained from the patient, the patient was brought back to the operating room and placed supine upon the operating table.  Prior to induction, the patient received IV antibiotics.  After obtaining adequate anesthesia, the patient was placed into semi-Fowler position with a shoulder roll in place and the patient's neck slightly hyperextended and rotated away from the surgical site.  The patient was prepped in the standard fashion for a left carotid endarterectomy.  I made an incision  anterior to the sternocleidomastoid muscle and dissected down through the subcutaneous tissue.  The platysmas was opened with electrocautery.  Then I dissected down to the internal jugular vein.  This was dissected posteriorly until I obtained visualization of the common carotid artery.  This was dissected out and then an umbilical tape was placed around the common carotid artery and I loosely applied a Rumel tourniquet.  I then dissected in a periadventitial fashion along the common carotid artery up to the bifurcation.  I then identified the external carotid artery and the superior thyroid artery.  A 2-0 silk tie was looped around the superior thyroid artery, and I also dissected out the external carotid artery and placed a vessel loop around it.  In continuing the dissection to the internal carotid artery, I identified the facial vein.  This was ligated and then transected, giving me improved exposure of the internal carotid artery.  In the process of this dissection, the hypoglossal nerve was identified.  I then dissected out the internal carotid artery until I identified an area of soft tissue in the internal carotid artery.  I placed an umbilical tape around this segment of the internal carotid artery.  I prepared a 10 mm and 8 mm shunt for use in this patient.  At this point, we gave the patient a therapeutic bolus of Heparin intravenously (roughly 80 units/kg).  After waiting 3 minutes, then I clamped all carotid arteries carotid artery.  I then made an arteriotomy in the common carotid artery with a 11 blade, and extended the arteriotomy with a Potts scissor down into the common carotid artery, then I carried the arteriotomy through the bifurcation into the internal carotid artery until I reached an area that was not diseased.  I  tried to place the 10 mm shunt first but it was unable to advance due to the small size of the artery.  I also tried to advance the 8 mm shunt but it also would not pass.  At this  point, I unclamped the left internal carotid artery and good vigorous backbleeding was present.  I felt this was consistent with adequate backbleeding to proceed without shunting.  At this point, I started the endarterectomy in the common carotid artery with a Cytogeneticist and carried this dissection down into the common carotid artery circumferentially.  Then I transected the plaque at a segment where it was adherent.  I then carried this dissection up into the external carotid artery.  The plaque was extracted by unclamping the external carotid artery and everting the artery.  The dissection was then carried into the internal carotid artery, extracting the remaining portion of the carotid plaque.  I passed the plaque off the field as a specimen.  I then spent the next 30 minutes removing intimal flaps and loose debris.  Eventually I reached the point where the residual plaque was densely adherent and any further dissection would compromise the integrity of the wall.  After verifying that there was no more loose intimal flaps or debris, I re-interrogated the entirety of this carotid artery.  At this point, I was satisfied that the minimal remaining disease was densely adherent to the wall and wall integrity was intact.  At this point, I then fashioned a bovine pericardial patch for the geometry of this artery and sewed it in place with two running stitch of 6-0 Prolene, one from each end.  Prior to completing this patch angioplasty, I backbled the external carotid artery and then reclamped it.  Back bleeding was: vigourous.   I then backbled the internal carotid artery and then reclamped it.  Back bleeding was: vigourous.   Finally, I backbled the common carotid artery and then reclamped it.  Back bleeding was: pulsatile.   I completed the patch angioplasty in the usual fashion.  First, I released the clamp on the external carotid artery, then I released it on the common carotid artery.  After waiting a few  seconds, I then released it on the internal carotid artery.  I then interrogated this patient's arteries with the continuous Doppler.  The audible waveforms in each artery were consistent with the expected characteristics for each artery.  The Sonosite probe was then sterilely draped and used to interrogate the carotid artery in both longitudinal and transverse views.  At this point, I washed out the wound, and placed thrombin and Gelfoam throughout.  I also gave the patient 30 mg of protamine to reverse his anticoagulation.   After waiting a few minutes, I removed the thrombin and Gelfoam and washed out the wound.  Bleeding points were controlled with suture repair with a 7-0 Prolene.  I washed out the surgical incision and then replaced thrombin and gelfoam in the wound.  I obtained a TLS drain and using the trocar routed the drain through the inferior posterior aspect of the incision, superficial to the sternocleidomastoid muscle.  I adjusted the length of the drain sharply and loaded the adapter on the external end of the drain.  After waiting a few more minutes, there was no more active bleeding in the surgical site.   All gelfoam was removed.  I then reapproximated the platysma muscle with a running stitch of 3-0 Vicryl.  The skin was then reapproximated with  a running subcuticular 4-0 Monocryl stitch.  The skin was then cleaned, dried and Dermabond was used to reinforce the skin closure.  The patient woke without any problems, neurologically intact.     COMPLICATIONS: none  CONDITION: stable  Leonides Sake, MD [facility] [place] Office: [phone] Pager: [phone]  01/18/2013, 10:19 AM

## 2013-01-18 NOTE — Progress Notes (Signed)
Utilization review completed.  

## 2013-01-18 NOTE — Progress Notes (Signed)
Dr Noreene Larsson aware SBP 88-98. He will be over to see pt. Before TX to 3300.

## 2013-01-18 NOTE — H&P (Signed)
  [facility] [place]  History and Physical Update  The patient was interviewed and re-examined.  The patient's previous History and Physical has been reviewed and is unchanged from my consult on: 01/11/13 except for: interval CTA confirming lesion.  There is no change in the plan of care: L CEA.  Leonides Sake, MD [facility] [place] Office: [phone] Pager: [phone]  01/18/2013, 7:16 AM

## 2013-01-18 NOTE — Transfer of Care (Signed)
Immediate Anesthesia Transfer of Care Note  Patient: Francis Brown  Procedure(s) Performed: Procedure(s): LEFT CAROTID ENDARTERECTOMY WITH PATCH ANGIOPLASTY (Left)  Patient Location: PACU  Anesthesia Type:General  Level of Consciousness: awake, alert  and oriented  Airway & Oxygen Therapy: Patient Spontanous Breathing and Patient connected to nasal cannula oxygen  Post-op Assessment: Report given to PACU RN and Post -op Vital signs reviewed and stable  Post vital signs: Reviewed and stable  Complications: No apparent anesthesia complications

## 2013-01-18 NOTE — Progress Notes (Signed)
Dr  Noreene Larsson here to see pt. OK to tx to 3300.

## 2013-01-19 LAB — CBC
HCT: 37.3 % — ABNORMAL LOW (ref 39.0–52.0)
Hemoglobin: 13 g/dL (ref 13.0–17.0)
MCH: 33.5 pg (ref 26.0–34.0)
MCHC: 34.9 g/dL (ref 30.0–36.0)
MCV: 96.1 fL (ref 78.0–100.0)
Platelets: 124 10*3/uL — ABNORMAL LOW (ref 150–400)
RDW: 14.3 % (ref 11.5–15.5)

## 2013-01-19 LAB — BASIC METABOLIC PANEL
BUN: 10 mg/dL (ref 6–23)
Creatinine, Ser: 1.09 mg/dL (ref 0.50–1.35)
GFR calc Af Amer: 83 mL/min — ABNORMAL LOW (ref >90)
GFR calc non Af Amer: 72 mL/min — ABNORMAL LOW (ref >90)
Glucose, Bld: 115 mg/dL — ABNORMAL HIGH (ref 70–99)
Potassium: 3.9 mEq/L (ref 3.5–5.1)

## 2013-01-19 NOTE — Progress Notes (Signed)
Discharged pt to home via [facility] with wife and family and belongings. Discharged instructions, prescriptions, f/u appt. And care notes on carotidendarectomy given to pt.

## 2013-01-19 NOTE — Progress Notes (Addendum)
[facility] Progress Note  01/19/2013 7:28 AM 1 Day Post-Op  Subjective:  No complaints.  Ready to go home.  Tm 99.2 HR 40's-50's-regular 90's-110's systolic 98% RA  Filed Vitals:   01/19/13 0400  BP: 106/61  Pulse: 55  Temp: 98.2 F (36.8 C)  Resp: 18     Physical Exam: Neuro:  In tact without deficit Incision:  C/d/i  CBC    Component Value Date/Time   WBC 9.7 01/19/2013 0615   RBC 3.88* 01/19/2013 0615   HGB 13.0 01/19/2013 0615   HCT 37.3* 01/19/2013 0615   PLT 124* 01/19/2013 0615   MCV 96.1 01/19/2013 0615   MCH 33.5 01/19/2013 0615   MCHC 34.9 01/19/2013 0615   RDW 14.3 01/19/2013 0615   LYMPHSABS 1.8 01/11/2013 2006   MONOABS 0.8 01/11/2013 2006   EOSABS 0.2 01/11/2013 2006   BASOSABS 0.1 01/11/2013 2006    BMET    Component Value Date/Time   NA 137 01/19/2013 0615   K 3.9 01/19/2013 0615   CL 104 01/19/2013 0615   CO2 23 01/19/2013 0615   GLUCOSE 115* 01/19/2013 0615   BUN 10 01/19/2013 0615   CREATININE 1.09 01/19/2013 0615   CALCIUM 7.7* 01/19/2013 0615   GFRNONAA 72* 01/19/2013 0615   GFRAA 83* 01/19/2013 0615     Intake/Output Summary (Last 24 hours) at 01/19/13 0728 Last data filed at 01/19/13 0600  Gross per 24 hour  Intake   3943 ml  Output   1570 ml  Net   2373 ml   Drain output:  3/4 full with serosanguinous fluid   Assessment/Plan:  This is a 62 y.o. male who is s/p left CEA 1 Day Post-Op  -pt is doing well this am. -pt neuro exam is in tact -pt has ambulated -acute surgical blood loss anemia-tolerating -discontinue drain -soft BP-rebounding this am with systolic of 120 and HR in 60's.  Will restart bystolic tomorrow and will continue the ACEI and Maxide today. -discharge home later this am-f/u with Dr. [name] in 2-3 weeks.   Doreatha Massed, PA-C [facility] [phone]

## 2013-01-19 NOTE — Discharge Summary (Signed)
[facility] Discharge Summary  Francis Brown [birth year] 62 y.o. male  [id number]  Admission Date: 01/18/2013  Discharge Date: 01/19/13  Physician: [name], MD  Admission Diagnosis: Left Internal Carotid Artery Stenosis   HPI:   This is a 62 y.o. male with PMH, MI, CABG (1998) and coronary stenting, Smoker, borderline DM and PAD who states that yesterday he developed sudden onset of right UE numbness and weakness which resolved in 10 minutes. He had had a left frontal H/A for several days with visual disturbances prior to this - "cloudy" vision in the left eye. All of these symptoms have completely resolved including the H/A. He states he was told he had a "TIA" in 2005 with nausea,vomiting with MRA of neck showing moderate stenosis of Left ICA. CT head at that time Negative for CVA.   Hospital Course:  The patient was admitted to the hospital and taken to the operating room on 01/18/2013 and underwent left carotid endarterectomy.  The pt tolerated the procedure well and was transported to the PACU in good condition.   By POD 1, the pt neuro status in tact.  His drain had minimal serosanguinous fluid and was discontinued on POD 1.  He denies any trouble swallowing.  The remainder of the hospital course consisted of increasing mobilization and increasing intake of solids without difficulty.    Recent Labs  01/18/13 0638 01/19/13 0615  NA 135 137  K 3.8 3.9  CL 98 104  CO2 24 23  GLUCOSE 138* 115*  BUN 14 10  CALCIUM 9.0 7.7*    Recent Labs  01/18/13 0638 01/19/13 0615  WBC 11.6* 9.7  HGB 16.5 13.0  HCT 46.6 37.3*  PLT 176 124*   No results found for this basename: INR,  in the last 72 hours  Discharge Instructions:   The patient is discharged to home with extensive instructions on wound care and progressive ambulation.  They are instructed not to drive or perform any heavy lifting until returning to see the physician in his office.  Discharge  Orders   Future Appointments Provider Department Dept Phone   02/04/2013 9:15 AM [name], MD [facility] [phone]   02/17/2013 4:30 PM Quintella Reichert, MD [facility] [phone]   Future Orders Complete By Expires   Call MD for:  redness, tenderness, or signs of infection (pain, swelling, bleeding, redness, odor or green/yellow discharge around incision site)  As directed    Call MD for:  severe or increased pain, loss or decreased feeling  in affected limb(s)  As directed    Call MD for:  temperature >100.5  As directed    CAROTID Sugery: Call MD for difficulty swallowing or speaking; weakness in arms or legs that is a new symtom; severe headache.  If you have increased swelling in the neck and/or  are having difficulty breathing, CALL 911  As directed    Discharge wound care:  As directed    Comments:     Shower daily with soap and water starting 01/19/13   Driving Restrictions  As directed    Comments:     No driving for 2 weeks and while taking pain medication   Lifting restrictions  As directed    Comments:     No lifting for 2 weeks   Nursing communication  As directed    Scheduling Instructions:     Please give paper Rx to patient at discharge.  It is located on the paper chart.   Resume previous diet  As directed       Discharge Diagnosis:  Left Internal Carotid Artery Stenosis  Secondary Diagnosis: Patient Active Problem List   Diagnosis Date Noted  . Acute embolic stroke 01/12/2013  . Chest pain 08/26/2011  . Coronary artery disease   . Hypertension    Past Medical History  Diagnosis Date  . Hypertension   . Coronary artery disease     a. 1996 s/p MI [place] [facility];  b. 1998 s/p MI in [place] with CABGx5 in [place];  b. 2010 s/p stenting to the OM in [place], [place] (Xience 4.0x12 DES, Xience 4.0x14mm DES)  . Hyperlipidemia   . Erectile dysfunction   . Stroke     a. 2005;  b. 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  . Carotid disease, bilateral     a. 01/2013 Carotid U/S: RICA 1-39%, LICA 80-99%. Antegrade vertebral flow.  . Tobacco abuse       Medication List         aspirin EC 81 MG tablet  Take 81 mg by mouth daily.     atorvastatin 40 MG tablet  Commonly known as:  LIPITOR  Take 40 mg by mouth daily.     clopidogrel 75 MG tablet  Commonly known as:  PLAVIX  Take 1 tablet (75 mg total) by mouth daily.     DERMAZINC CREAM EX  Apply 1 application topically daily.     DERMAZINC SPRAY EX  Apply 1 application topically daily.     lisinopril 10 MG tablet  Commonly known as:  PRINIVIL,ZESTRIL  Take 10 mg by mouth daily.     nebivolol 10 MG tablet  Commonly known as:  BYSTOLIC  Take 1 tablet (10 mg total) by mouth daily.     nitroGLYCERIN 0.4 MG SL tablet  Commonly known as:  NITROSTAT  Place 1 tablet (0.4 mg total) under the tongue every 5 (five) minutes as needed for chest pain.     oxyCODONE 5 MG immediate release tablet  Commonly known as:  ROXICODONE  Take 1 tablet (5 mg total) by mouth every 6 (six) hours as needed for pain.     triamterene-hydrochlorothiazide 37.5-25 MG per tablet  Commonly known as:  MAXZIDE-25  Take 1 each (1 tablet total) by mouth daily.       Pt will be instructed to hold his bystolic on POD 1 and restart POD 2.  He had HR in the 40's-50's postoperatively.  Back in 60's on POD 1, but with soft BP that is also rebounding.  Roxicodone #30 No Refill  Disposition: home  Patient's condition: is Good  Follow up: 1. Dr.  [name] in 2 weeks.   Doreatha Massed, PA-C [facility] [phone]  Addendum  I have independently interviewed and examined the patient, and I agree with the physician assistant's discharge summary.  This patient underwent a L CEA for a sx L ICA stenosis >80%.  The patient underwent an unremarkable L CEA.  He had a TLS drain placed during that case due to Plavix and ASA use  preoperatively.  His post-operative course was unremarkable.  By discharge, he had his TLS drain removed with minimal drainage.  He neurologically was intact with pain well controlled and diet well tolerated.  The patient will follow up in two weeks for wound re-evaluation.  Leonides Sake, MD [facility] Office:  [phone] Pager: [phone]  01/19/2013, 8:31 AM   --- For VQI Registry use --- Instructions: Press F2 to tab through selections.  Delete question if not applicable.   Modified Rankin score at D/C (0-6): 0  IV medication needed for:  1. Hypertension: No 2. Hypotension: No  Post-op Complications: No  1. Post-op CVA or TIA: No  If yes: Event classification (right eye, left eye, right cortical, left cortical, verterobasilar, other): n/a  If yes: Timing of event (intra-op, <6 hrs post-op, >=6 hrs post-op, unknown): n/a  2. CN injury: No  If yes: CN n/a injuried   3. Myocardial infarction: No  If yes: Dx by (EKG or clinical, Troponin): n/a  4.  CHF: No  5.  Dysrhythmia (new): No  6. Wound infection: No  7. Reperfusion symptoms: No  8. Return to OR: No  If yes: return to OR for (bleeding, neurologic, other CEA incision, other): n/a  Discharge medications: Statin use:  Yes If No: [ ]  For Medical reasons, [ ]  Non-compliant, [ ]  Not-indicated ASA use:  Yes  If No: [ ]  For Medical reasons, [ ]  Non-compliant, [ ]  Not-indicated Beta blocker use:  Yes If No: [ ]  For Medical reasons, [ ]  Non-compliant, [ ]  Not-indicated ACE-Inhibitor use:  Yes If No: [ ]  For Medical reasons, [ ]  Non-compliant, [ ]  Not-indicated P2Y12 Antagonist use: yes, [xn ] Plavix, [ ]  Plasugrel, [ ]  Ticlopinine, [ ]  Ticagrelor, [ ]  Other, [ ]  No for medical reason, [ ]  Non-compliant, [ ]  Not-indicated Anti-coagulant use:  no, [ ]  Warfarin, [ ]  Rivaroxaban, [ ]  Dabigatran, [ ]  Other, [ ]  No for medical reason, [ ]  Non-compliant, [ ]  Not-indicated

## 2013-01-20 ENCOUNTER — Other Ambulatory Visit: Payer: Self-pay | Admitting: Cardiology

## 2013-01-21 ENCOUNTER — Telehealth: Payer: Self-pay | Admitting: Cardiology

## 2013-01-21 ENCOUNTER — Encounter (HOSPITAL_COMMUNITY): Payer: Self-pay | Admitting: Vascular Surgery

## 2013-01-21 ENCOUNTER — Telehealth: Payer: Self-pay

## 2013-01-21 NOTE — Telephone Encounter (Signed)
Pt's daughter called to report pt's pulse rate is very low at 42 bpm.  Reports BP is 120's-130"s / 70's.  Was instructed when he left the hospital to hold the Bystolic x 1 day and restart.  Stated pt. took Bystolic 10 mg on 10/9 and this AM ;10/10.  States feels sluggish.  Also reported took his BP meds this AM with the Bystolic; states took Lisinopril 10 mg, and Triamterene/HCTZ 37.5/25 mg.  Discussed with Dr. [name].  Advised to hold Bystolic dose when pulse rate is < 60 bpm.  Encouraged pt. to rest over the weekend with current pulse rate, and symptoms of feeling sluggish.  Advised daughter to call office over weekend, to talk to MD on call, or go to the ER if pt's symptoms worsen.  Verb. understanding.

## 2013-01-21 NOTE — Telephone Encounter (Signed)
New Problem:  Pt states on Tuesday a Dr. Claudie Fisherman did his left carotid. Pt states since then he has had a low heart rate. Pt states it's been staying in the low 40's. Pt states his pulse now is 42.

## 2013-01-24 NOTE — Telephone Encounter (Signed)
I see NOTES/  pt was advised by vascular surgeon.

## 2013-01-24 NOTE — Telephone Encounter (Signed)
No answer msg left

## 2013-01-25 NOTE — Telephone Encounter (Signed)
To Dr. Mayford Knife, please advise if there is anything we need to do further.

## 2013-01-25 NOTE — Telephone Encounter (Signed)
10 MG daily according to ECW

## 2013-01-25 NOTE — Telephone Encounter (Signed)
Please verify Bystolic dose with patient

## 2013-01-25 NOTE — Telephone Encounter (Signed)
What dose of bystolic is he currently taking?

## 2013-01-26 NOTE — Telephone Encounter (Signed)
Pt was taken off Bystolic because his heart rate was in the 40s.  Pt was taken off of it by Dr. [name].

## 2013-01-26 NOTE — Telephone Encounter (Signed)
Follow Up ° °Pt returned call//  °

## 2013-01-26 NOTE — Telephone Encounter (Signed)
Reviewed with Launa Flight, CMA and pt should come in tomorrow AM when Dr. Mayford Knife in office so she can review EKG. I spoke with pt and he will come in for EKG and BP check on January 27, 2013 at 10:00. He reports since stopping Bystolic heart rate today is 63 and 71. Blood pressure readings are 127/89 and 127/80. Lethargy has improved after stopping Bystolic

## 2013-01-26 NOTE — Telephone Encounter (Signed)
Please have patient come in for BP check and EKG with nurse

## 2013-01-26 NOTE — Telephone Encounter (Signed)
LVM for pt to return call to verify Bystolic dosage is 10 MG

## 2013-01-27 ENCOUNTER — Ambulatory Visit: Payer: BC Managed Care – PPO | Admitting: Cardiology

## 2013-01-27 ENCOUNTER — Ambulatory Visit (INDEPENDENT_AMBULATORY_CARE_PROVIDER_SITE_OTHER): Payer: BC Managed Care – PPO | Admitting: *Deleted

## 2013-01-27 ENCOUNTER — Encounter: Payer: Self-pay | Admitting: Internal Medicine

## 2013-01-27 VITALS — BP 144/86 | HR 61

## 2013-01-27 DIAGNOSIS — I10 Essential (primary) hypertension: Secondary | ICD-10-CM

## 2013-01-27 NOTE — Progress Notes (Signed)
Patient states he is "feeling better each day" since his recent surgery. He states his home BP readings are usually in the 120-130 range systolic and his heart rate has improved from a low in the 40's pre-surgery to now consistently 60's. BP and EKG done and charted. Dr. Johney Frame reviewed EKG. Patient has appointment with Dr. Mayford Knife on Nov 6th.

## 2013-02-03 ENCOUNTER — Encounter: Payer: Self-pay | Admitting: Vascular Surgery

## 2013-02-04 ENCOUNTER — Ambulatory Visit (INDEPENDENT_AMBULATORY_CARE_PROVIDER_SITE_OTHER): Payer: Self-pay | Admitting: Vascular Surgery

## 2013-02-04 ENCOUNTER — Encounter: Payer: Self-pay | Admitting: Vascular Surgery

## 2013-02-04 DIAGNOSIS — I6529 Occlusion and stenosis of unspecified carotid artery: Secondary | ICD-10-CM

## 2013-02-04 DIAGNOSIS — Z48812 Encounter for surgical aftercare following surgery on the circulatory system: Secondary | ICD-10-CM

## 2013-02-04 NOTE — Addendum Note (Signed)
Addended by: Sharee Pimple on: 02/04/2013 02:02 PM   Modules accepted: Orders

## 2013-02-04 NOTE — Progress Notes (Signed)
[facility] [place]  Postoperative Visit  History of Present Illness  Francis Brown is a 62 y.o. male who presents for postoperative follow-up for: L CEA (Date: 01/18/13).  The patient's neck incision is healed.  The patient has had no stroke or TIA symptoms.  The pt continues to be off his Bystolic due to period of bradycardia after procedure.  His SBP at home has been 120-130.  For VQI Use Only  PRE-ADM LIVING: Home  AMB STATUS: Ambulatory  Physical Examination  Filed Vitals:   02/04/13 1325  BP: 126/80  Pulse: 86  Resp:     L Neck: Incision is healed Neuro: CN 2-12 are intact , Motor strength is 5/5 bilaterally, sensation is grossly intact  Medical Decision Making  ARCH METHOT is a 62 y.o. male who presents s/p L CEA.  The patient's neck incision is healing with no stroke symptoms. I discussed in depth with the patient the nature of atherosclerosis, and emphasized the importance of maximal medical management including strict control of blood pressure, blood glucose, and lipid levels, obtaining regular exercise, anti-platelet use and cessation of smoking.   The patient is currently on an antiplatelet: ASA and Plavix.  The patient can come off the ASA if ok with Cardiology. The patient is currently on a statin: Lipitor. The patient is aware that without maximal medical management the underlying atherosclerotic disease process will progress, limiting the benefit of any interventions. The patient's surveillance will included routine carotid duplex studies which will be completed in: 6 months, at which time the patient will be re-evaluated.   I emphasized the importance of routine surveillance of the carotid arteries as recurrence of stenosis is possible, especially with proper management of underlying atherosclerotic disease. The patient agrees to participate in their maximal medical care and routine surveillance.  Thank you for allowing [place] to participate  in this patient's care.  Leonides Sake, MD [facility] [place] Office: [phone] Pager: [phone]

## 2013-02-17 ENCOUNTER — Encounter (INDEPENDENT_AMBULATORY_CARE_PROVIDER_SITE_OTHER): Payer: Self-pay

## 2013-02-17 ENCOUNTER — Ambulatory Visit (INDEPENDENT_AMBULATORY_CARE_PROVIDER_SITE_OTHER): Payer: BC Managed Care – PPO | Admitting: Cardiology

## 2013-02-17 ENCOUNTER — Ambulatory Visit: Payer: BC Managed Care – PPO | Admitting: Cardiology

## 2013-02-17 ENCOUNTER — Encounter: Payer: Self-pay | Admitting: Cardiology

## 2013-02-17 VITALS — BP 158/90 | HR 76 | Ht 72.0 in | Wt 239.0 lb

## 2013-02-17 DIAGNOSIS — I10 Essential (primary) hypertension: Secondary | ICD-10-CM

## 2013-02-17 DIAGNOSIS — I2510 Atherosclerotic heart disease of native coronary artery without angina pectoris: Secondary | ICD-10-CM

## 2013-02-17 DIAGNOSIS — E785 Hyperlipidemia, unspecified: Secondary | ICD-10-CM

## 2013-02-17 DIAGNOSIS — Z5189 Encounter for other specified aftercare: Secondary | ICD-10-CM

## 2013-02-17 DIAGNOSIS — R001 Bradycardia, unspecified: Secondary | ICD-10-CM | POA: Insufficient documentation

## 2013-02-17 NOTE — Progress Notes (Signed)
[address], [address] [place], [place]  [address] Phone: [phone] Fax:  [phone]  Date:  02/17/2013   ID:  Francis Brown, DOB 1951/01/29, MRN [id number]  PCP:  Jerral Ralph, MD  Cardiologist:  Armanda Magic, MD     History of Present Illness: Francis Brown is a 62 y.o. male with a history of ASCAD, HTN and dyslipidemia presents today for followup.  He is doing well.  He denies any chest pain, SOB, DOE, LE edema, dizziness, palpitations or syncope.  Since I saw him last he had a left CEA done and has been well post op. Post op he had some bradycardia and his Bystolic was stopped.  He is concerned that since stopping the Bystolic his HR is in the high 90-100bpm range.  His HR was normal in Dr. Nicky Pugh recent OV and at our nurse check.     Wt Readings from Last 3 Encounters:  01/18/13 231 lb 14.8 oz (105.2 kg)  01/18/13 231 lb 14.8 oz (105.2 kg)  01/12/13 231 lb 14.8 oz (105.2 kg)     Past Medical History  Diagnosis Date  . Hypertension   . Coronary artery disease     a. 1996 s/p MI [place] [facility] Rx;  b. 1998 s/p MI in [place] with CABGx5 in [place];  b. 2010 s/p stenting to the OM in [place], [place] (Xience 4.0x12 DES, Xience 4.0x2mm DES)  . Erectile dysfunction   . Stroke     a. 2005;  b. 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  . Carotid disease, bilateral     a. 01/2013 Carotid U/S: RICA 1-39%, LICA 80-99%. Antegrade vertebral flow.  . Tobacco abuse   . Hyperlipidemia     Current Outpatient Prescriptions  Medication Sig Dispense Refill  . Antiseborrheic Products, Misc. [facility] CREAM EX) Apply 1 application topically daily.      [name] aspirin EC 81 MG tablet Take 81 mg by mouth daily.      [name] atorvastatin (LIPITOR) 40 MG tablet Take 40 mg by mouth daily.      [name] CIALIS 20 MG tablet       . clopidogrel (PLAVIX) 75 MG tablet Take 1 tablet (75 mg total) by mouth daily.  30 tablet  6  . diazepam (VALIUM) 2 MG tablet       . lisinopril  (PRINIVIL,ZESTRIL) 10 MG tablet Take 10 mg by mouth daily.      . nebivolol (BYSTOLIC) 10 MG tablet Take 1 tablet (10 mg total) by mouth daily.  30 tablet  11  . nitroGLYCERIN (NITROSTAT) 0.4 MG SL tablet Place 1 tablet (0.4 mg total) under the tongue every 5 (five) minutes as needed for chest pain.  25 tablet  3  . oxyCODONE (ROXICODONE) 5 MG immediate release tablet Take 1 tablet (5 mg total) by mouth every 6 (six) hours as needed for pain.  30 tablet  0  . Pyrithione Zinc (DERMAZINC SPRAY EX) Apply 1 application topically daily.      [name] triamterene-hydrochlorothiazide (DYAZIDE) 37.5-25 MG per capsule TAKE 1 CAPSULE IN THE MORNING ONCE A DAY ORALLY  90 capsule  0  . triamterene-hydrochlorothiazide (MAXZIDE-25) 37.5-25 MG per tablet Take 1 each (1 tablet total) by mouth daily.  30 tablet  11   No current facility-administered medications for this visit.    Allergies:   No Known Allergies  Social History:  The patient  reports that he has been smoking Cigarettes.  He has a 24 pack-year smoking history. He has never used smokeless tobacco. He reports that he drinks alcohol. He reports that he does not use illicit drugs.   Family History:  The patient's family history includes CAD in his father; Other in his mother.   ROS:  Please see the history of present illness.      All other systems reviewed and negative.   PHYSICAL EXAM: VS:  There were no vitals taken for this visit. Well nourished, well developed, in no acute distress HEENT: normal Neck: no JVD Cardiac:  normal S1, S2; RRR; no murmur Lungs:  clear to auscultation bilaterally, no wheezing, rhonchi or rales Abd: soft, nontender, no hepatomegaly Ext: no edema Skin: warm and dry Neuro:  CNs 2-12 intact, no focal abnormalities noted  EKG:       ASSESSMENT AND PLAN:  1. ASCAD stable with no angina  - continue ASA/Plavix 2. HTN - borderline control today but he checks it at home and it has been 120-135/70-5-85mmHg  - continue  Dyazide/Lisinopril 3. Dyslipidemia  - continue Atorvastatin  - check fasting lipids/ALT 4.  Bradycardia - resolved but now he is concerned that his HR is too high  - I will get a 24 hour Holter monitor to assess his average HR  Followup with me in 6 months  Signed, Armanda Magic, MD 02/17/2013 9:59 AM

## 2013-02-17 NOTE — Patient Instructions (Signed)
Your physician recommends that you continue on your current medications as directed. Please refer to the Current Medication list given to you today.  Your physician has recommended that you wear a holter monitor. Holter monitors are medical devices that record the heart's electrical activity. Doctors most often use these monitors to diagnose arrhythmias. Arrhythmias are problems with the speed or rhythm of the heartbeat. The monitor is a small, portable device. You can wear one while you do your normal daily activities. This is usually used to diagnose what is causing palpitations/syncope (passing out).  Your physician recommends that you return for lab work on the day that you have your monitor put on. Please remember this is a fasting panel.   Your physician wants you to follow-up in: 6 months with Dr. Sherlyn Lick will receive a reminder letter in the mail two months in advance. If you don't receive a letter, please call our office to schedule the follow-up appointment.

## 2013-02-23 ENCOUNTER — Other Ambulatory Visit: Payer: BC Managed Care – PPO

## 2013-03-01 ENCOUNTER — Other Ambulatory Visit: Payer: BC Managed Care – PPO

## 2013-03-22 ENCOUNTER — Other Ambulatory Visit: Payer: Self-pay | Admitting: Cardiology

## 2013-04-14 DIAGNOSIS — E119 Type 2 diabetes mellitus without complications: Secondary | ICD-10-CM

## 2013-04-14 HISTORY — DX: Type 2 diabetes mellitus without complications: E11.9

## 2013-04-15 ENCOUNTER — Other Ambulatory Visit: Payer: Self-pay | Admitting: Cardiology

## 2013-08-04 ENCOUNTER — Encounter: Payer: Self-pay | Admitting: Family

## 2013-08-05 ENCOUNTER — Other Ambulatory Visit (HOSPITAL_COMMUNITY): Payer: BC Managed Care – PPO

## 2013-08-05 ENCOUNTER — Ambulatory Visit: Payer: BC Managed Care – PPO | Admitting: Family

## 2013-08-05 ENCOUNTER — Ambulatory Visit: Payer: BC Managed Care – PPO | Admitting: Vascular Surgery

## 2013-08-09 ENCOUNTER — Encounter: Payer: Self-pay | Admitting: Family

## 2013-08-10 ENCOUNTER — Ambulatory Visit: Payer: BC Managed Care – PPO | Admitting: Family

## 2013-08-10 ENCOUNTER — Other Ambulatory Visit (HOSPITAL_COMMUNITY): Payer: BC Managed Care – PPO

## 2013-08-18 ENCOUNTER — Encounter: Payer: Self-pay | Admitting: Family

## 2013-08-19 ENCOUNTER — Ambulatory Visit (HOSPITAL_COMMUNITY)
Admission: RE | Admit: 2013-08-19 | Discharge: 2013-08-19 | Disposition: A | Discharge status: Home / Self Care | Payer: BC Managed Care – PPO | Source: Ambulatory Visit | Attending: Family | Admitting: Family

## 2013-08-19 ENCOUNTER — Ambulatory Visit (INDEPENDENT_AMBULATORY_CARE_PROVIDER_SITE_OTHER): Payer: BC Managed Care – PPO | Admitting: Family

## 2013-08-19 ENCOUNTER — Encounter: Payer: Self-pay | Admitting: Family

## 2013-08-19 VITALS — BP 154/95 | HR 73 | Resp 16 | Ht 72.0 in | Wt 235.0 lb

## 2013-08-19 DIAGNOSIS — Z48812 Encounter for surgical aftercare following surgery on the circulatory system: Secondary | ICD-10-CM | POA: Insufficient documentation

## 2013-08-19 DIAGNOSIS — I6529 Occlusion and stenosis of unspecified carotid artery: Secondary | ICD-10-CM | POA: Insufficient documentation

## 2013-08-19 DIAGNOSIS — I63239 Cerebral infarction due to unspecified occlusion or stenosis of unspecified carotid arteries: Secondary | ICD-10-CM | POA: Insufficient documentation

## 2013-08-19 NOTE — Patient Instructions (Addendum)
Stroke Prevention Some medical conditions and behaviors are associated with an increased chance of having a stroke. You may prevent a stroke by making healthy choices and managing medical conditions. HOW CAN I REDUCE MY RISK OF HAVING A STROKE?   Stay physically active. Get at least 30 minutes of activity on most or all days.  Do not smoke. It may also be helpful to avoid exposure to secondhand smoke.  Limit alcohol use. Moderate alcohol use is considered to be:  No more than 2 drinks per day for men.  No more than 1 drink per day for nonpregnant women.  Eat healthy foods. This involves  Eating 5 or more servings of fruits and vegetables a day.  Following a diet that addresses high blood pressure (hypertension), high cholesterol, diabetes, or obesity.  Manage your cholesterol levels.  A diet low in saturated fat, trans fat, and cholesterol and high in fiber may control cholesterol levels.  Take any prescribed medicines to control cholesterol as directed by your health care provider.  Manage your diabetes.  A controlled-carbohydrate, controlled-sugar diet is recommended to manage diabetes.  Take any prescribed medicines to control diabetes as directed by your health care provider.  Control your hypertension.  A low-salt (sodium), low-saturated fat, low-trans fat, and low-cholesterol diet is recommended to manage hypertension.  Take any prescribed medicines to control hypertension as directed by your health care provider.  Maintain a healthy weight.  A reduced-calorie, low-sodium, low-saturated fat, low-trans fat, low-cholesterol diet is recommended to manage weight.  Stop drug abuse.  Avoid taking birth control pills.  Talk to your health care provider about the risks of taking birth control pills if you are over 35 years old, smoke, get migraines, or have ever had a blood clot.  Get evaluated for sleep disorders (sleep apnea).  Talk to your health care provider about  getting a sleep evaluation if you snore a lot or have excessive sleepiness.  Take medicines as directed by your health care provider.  For some people, aspirin or blood thinners (anticoagulants) are helpful in reducing the risk of forming abnormal blood clots that can lead to stroke. If you have the irregular heart rhythm of atrial fibrillation, you should be on a blood thinner unless there is a good reason you cannot take them.  Understand all your medicine instructions.  Make sure that other other conditions (such as anemia or atherosclerosis) are addressed. SEEK IMMEDIATE MEDICAL CARE IF:   You have sudden weakness or numbness of the face, arm, or leg, especially on one side of the body.  Your face or eyelid droops to one side.  You have sudden confusion.  You have trouble speaking (aphasia) or understanding.  You have sudden trouble seeing in one or both eyes.  You have sudden trouble walking.  You have dizziness.  You have a loss of balance or coordination.  You have a sudden, severe headache with no known cause.  You have new chest pain or an irregular heartbeat. Any of these symptoms may represent a serious problem that is an emergency. Do not wait to see if the symptoms will go away. Get medical help at once. Call your local emergency services  (911 in [place] Do not drive yourself to the hospital. Document Released: 05/08/2004 Document Revised: 01/19/2013 Document Reviewed: 10/01/2012 Exit[employer] Patient Information 2014 [employer].   Smoking Cessation Quitting smoking is important to your health and has many advantages. However, it is not always easy to quit since nicotine is a   very addictive drug. Often times, people try 3 times or more before being able to quit. This document explains the best ways for you to prepare to quit smoking. Quitting takes hard work and a lot of effort, but you can do it. ADVANTAGES OF QUITTING SMOKING  You will live longer, feel better,  and live better.  Your body will feel the impact of quitting smoking almost immediately.  Within 20 minutes, blood pressure decreases. Your pulse returns to its normal level.  After 8 hours, carbon monoxide levels in the blood return to normal. Your oxygen level increases.  After 24 hours, the chance of having a heart attack starts to decrease. Your breath, hair, and body stop smelling like smoke.  After 48 hours, damaged nerve endings begin to recover. Your sense of taste and smell improve.  After 72 hours, the body is virtually free of nicotine. Your bronchial tubes relax and breathing becomes easier.  After 2 to 12 weeks, lungs can hold more air. Exercise becomes easier and circulation improves.  The risk of having a heart attack, stroke, cancer, or lung disease is greatly reduced.  After 1 year, the risk of coronary heart disease is cut in half.  After 5 years, the risk of stroke falls to the same as a nonsmoker.  After 10 years, the risk of lung cancer is cut in half and the risk of other cancers decreases significantly.  After 15 years, the risk of coronary heart disease drops, usually to the level of a nonsmoker.  If you are pregnant, quitting smoking will improve your chances of having a healthy baby.  The people you live with, especially any children, will be healthier.  You will have extra money to spend on things other than cigarettes. QUESTIONS TO THINK ABOUT BEFORE ATTEMPTING TO QUIT You may want to talk about your answers with your caregiver.  Why do you want to quit?  If you tried to quit in the past, what helped and what did not?  What will be the most difficult situations for you after you quit? How will you plan to handle them?  Who can help you through the tough times? Your family? Friends? A caregiver?  What pleasures do you get from smoking? What ways can you still get pleasure if you quit? Here are some questions to ask your caregiver:  How can you  help me to be successful at quitting?  What medicine do you think would be best for me and how should I take it?  What should I do if I need more help?  What is smoking withdrawal like? How can I get information on withdrawal? GET READY  Set a quit date.  Change your environment by getting rid of all cigarettes, ashtrays, matches, and lighters in your home, car, or work. Do not let people smoke in your home.  Review your past attempts to quit. Think about what worked and what did not. GET SUPPORT AND ENCOURAGEMENT You have a better chance of being successful if you have help. You can get support in many ways.  Tell your family, friends, and co-workers that you are going to quit and need their support. Ask them not to smoke around you.  Get individual, group, or telephone counseling and support. Programs are available at local hospitals and health centers. Call your local health department for information about programs in your area.  Spiritual beliefs and practices may help some smokers quit.  Download a "quit meter" on your computer   to keep track of quit statistics, such as how long you have gone without smoking, cigarettes not smoked, and money saved.  Get a self-help book about quitting smoking and staying off of tobacco. LEARN NEW SKILLS AND BEHAVIORS  Distract yourself from urges to smoke. Talk to someone, go for a walk, or occupy your time with a task.  Change your normal routine. Take a different route to work. Drink tea instead of coffee. Eat breakfast in a different place.  Reduce your stress. Take a hot bath, exercise, or read a book.  Plan something enjoyable to do every day. Reward yourself for not smoking.  Explore interactive web-based programs that specialize in helping you quit. GET MEDICINE AND USE IT CORRECTLY Medicines can help you stop smoking and decrease the urge to smoke. Combining medicine with the above behavioral methods and support can greatly increase  your chances of successfully quitting smoking.  Nicotine replacement therapy helps deliver nicotine to your body without the negative effects and risks of smoking. Nicotine replacement therapy includes nicotine gum, lozenges, inhalers, nasal sprays, and skin patches. Some may be available over-the-counter and others require a prescription.  Antidepressant medicine helps people abstain from smoking, but how this works is unknown. This medicine is available by prescription.  Nicotinic receptor partial agonist medicine simulates the effect of nicotine in your brain. This medicine is available by prescription. Ask your caregiver for advice about which medicines to use and how to use them based on your health history. Your caregiver will tell you what side effects to look out for if you choose to be on a medicine or therapy. Carefully read the information on the package. Do not use any other product containing nicotine while using a nicotine replacement product.  RELAPSE OR DIFFICULT SITUATIONS Most relapses occur within the first 3 months after quitting. Do not be discouraged if you start smoking again. Remember, most people try several times before finally quitting. You may have symptoms of withdrawal because your body is used to nicotine. You may crave cigarettes, be irritable, feel very hungry, cough often, get headaches, or have difficulty concentrating. The withdrawal symptoms are only temporary. They are strongest when you first quit, but they will go away within 10 14 days. To reduce the chances of relapse, try to:  Avoid drinking alcohol. Drinking lowers your chances of successfully quitting.  Reduce the amount of caffeine you consume. Once you quit smoking, the amount of caffeine in your body increases and can give you symptoms, such as a rapid heartbeat, sweating, and anxiety.  Avoid smokers because they can make you want to smoke.  Do not let weight gain distract you. Many smokers will gain  weight when they quit, usually less than 10 pounds. Eat a healthy diet and stay active. You can always lose the weight gained after you quit.  Find ways to improve your mood other than smoking. FOR MORE INFORMATION  www.smokefree.gov  Document Released: 03/25/2001 Document Revised: 09/30/2011 Document Reviewed: 07/10/2011 [employer] Patient Information 2014 [employer].  

## 2013-08-19 NOTE — Progress Notes (Signed)
Established Carotid Patient   History of Present Illness  Francis Brown is a 63 y.o. male patient of Dr. Imogene Burnhen who is s/p L CEA (Date: 01/18/13).  He had a stroke in 2005, TIA in 2012 and 2014 right before the left CEA, as manifested by right UE contracture an numbness, denies amaurosis fugax, denies unilateral facial drooping, denies aphasia. The 2005 stroke affected his left hand as numbness which has resolved. He had 3 MI's, 1996 with angioplasty, 1998 with 5 vessel CABG, 2010 with 2 stents.   Pt denies New Medical or Surgical History.  Pt Diabetic: Yes, his daily average blood sugar is 140 Pt smoker: smoker  (1/2 ppd, started smoking at age 63 yrs), started Wellbutrin to try to stop smoking  Pt meds include: Statin : Yes ASA: Yes Other anticoagulants/antiplatelets: Plavix   Past Medical History  Diagnosis Date  . Hypertension   . Coronary artery disease     a. 1996 s/p MI [place];  b. 1998 s/p MI in [place] with CABGx5 in [place];  b. 2010 s/p stenting to the OM in [place] (Xience 4.0x12 DES, Xience 4.0x718mm DES)  . Erectile dysfunction   . Stroke     a. 2005;  b. 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  . Carotid disease, bilateral     a. 01/2013 Carotid U/S: RICA 1-39%, LICA 80-99%. Antegrade vertebral flow.  . Tobacco abuse   . Hyperlipidemia   . Bradycardia, drug induced   . CHF (congestive heart failure)   . Diabetes mellitus without complication 2015    Social History History  Substance Use Topics  . Smoking status: Former Smoker -- 0.80 packs/day for 30 years    Types: Cigarettes    Quit date: 08/20/2003  . Smokeless tobacco: Never Used  . Alcohol Use: Yes     Comment: occ    Family History Family History  Problem Relation Age of Onset  . CAD Father     died @ 3865  . Heart disease Father     Before 329 years old  . Hyperlipidemia Father   . Heart attack Father   . Other Mother     died @ 271  . Heart disease  Mother   . Hyperlipidemia Mother   . Varicose Veins Mother     Surgical History Past Surgical History  Procedure Laterality Date  . Cardiac surgery    . Coronary stent placement    . Coronary artery bypass graft    . Coronary angioplasty    . Colonoscopy    . Endarterectomy Left 01/18/2013    Procedure: LEFT CAROTID ENDARTERECTOMY WITH PATCH ANGIOPLASTY;  Surgeon: Fransisco HertzBrian L Chen, MD;  Location: [facility] OR;  Service: Vascular;  Laterality: Left;  . Carotid endarterectomy Left 01-18-13    cea    No Known Allergies  Current Outpatient Prescriptions  Medication Sig Dispense Refill  . aspirin EC 81 MG tablet Take 81 mg by mouth daily.      [name]. atorvastatin (LIPITOR) 40 MG tablet TAKE 1 TABLET BY MOUTH EVERY DAY  30 tablet  5  . BAYER CONTOUR NEXT TEST test strip       . BAYER MICROLET LANCETS lancets       . buPROPion (WELLBUTRIN SR) 150 MG 12 hr tablet Take 150 mg by mouth 2 (two) times daily.       [name]. CIALIS 20 MG tablet       . clopidogrel (PLAVIX) 75  MG tablet Take 1 tablet (75 mg total) by mouth daily.  30 tablet  6  . FLUoxetine HCl (PROZAC PO) Take by mouth as needed.      [name]. lisinopril (PRINIVIL,ZESTRIL) 10 MG tablet TAKE 1 TABLET ONCE A DAY ORALLY  90 tablet  1  . Pyrithione Zinc (DERMAZINC SPRAY EX) Apply 1 application topically daily.      [name]. triamterene-hydrochlorothiazide (DYAZIDE) 37.5-25 MG per capsule TAKE 1 CAPSULE IN THE MORNING ONCE A DAY ORALLY  90 capsule  0  . VIAGRA 100 MG tablet       . diazepam (VALIUM) 2 MG tablet       . hydrOXYzine (ATARAX/VISTARIL) 25 MG tablet       . oxyCODONE (ROXICODONE) 5 MG immediate release tablet Take 1 tablet (5 mg total) by mouth every 6 (six) hours as needed for pain.  30 tablet  0   No current facility-administered medications for this visit.    Review of Systems : See HPI for pertinent positives and negatives.  Physical Examination   Filed Vitals:   08/19/13 1040 08/19/13 1043  BP: 147/92 154/95  Pulse: 74 73  Resp:  16   Height:  6' (1.829 m)  Weight:  235 lb (106.595 kg)  SpO2:  98%  Body mass index is 31.86 kg/(m^2).  General: WDWN male in NAD GAIT: normal Eyes: PERRLA Pulmonary:  Non-labored, CTAB, Negative  Rales, Negative rhonchi, & Negative wheezing.  Cardiac: regular Rhythm ,  Negative detected murmur.  VASCULAR EXAM Carotid Bruits Left Right   Negative Negative    Aorta is not palpable. Radial pulses are 2+ palpable and equal.                                                                                                                            LE Pulses LEFT RIGHT       POPLITEAL  not palpable   not palpable       POSTERIOR TIBIAL  not palpable   not palpable        DORSALIS PEDIS      ANTERIOR TIBIAL  palpable   palpable     Gastrointestinal: soft, nontender, BS WNL, no r/g,  negative masses.  Musculoskeletal: Negative muscle atrophy/wasting. M/S 5/5 throughout, Extremities without ischemic changes.  Neurologic: A&O X 3; Appropriate Affect ; SENSATION ;normal;  Speech is normal CN 2-12 intact, Pain and light touch intact in extremities, Motor exam as listed above.   Non-Invasive Vascular Imaging CAROTID DUPLEX 08/19/2013   CEREBROVASCULAR DUPLEX EVALUATION    INDICATION: Carotid endarterectomy     PREVIOUS INTERVENTION(S): Left carotid endarterectomy on 01/18/13    DUPLEX EXAM:     RIGHT  LEFT  Peak Systolic Velocities (cm/s) End Diastolic Velocities (cm/s) Plaque LOCATION Peak Systolic Velocities (cm/s) End Diastolic Velocities (cm/s) Plaque  100 23  CCA PROXIMAL 98 25   115 28  CCA MID 106 31 HT  85 23  CCA  DISTAL 113 26   101 21  ECA 110 21   74 19 HT ICA PROXIMAL 79 20   80 25  ICA MID 79 27   66 24  ICA DISTAL 61 23     0.87 ICA / CCA Ratio (PSV) Not Calculated  Antegrade Vertebral Flow Antegrade  170 Brachial Systolic Pressure (mmHg) 166  Multiphasic (subclavian artery) Brachial Artery Waveforms Multiphasic (subclavian artery)    Plaque Morphology:   HM = Homogeneous, HT = Heterogeneous, CP = Calcific Plaque, SP = Smooth Plaque, IP = Irregular Plaque     ADDITIONAL FINDINGS: No significant stenosis of the bilateral external and common carotid arteries.    IMPRESSION: 1. Patent left carotid endarterectomy site with no left internal carotid artery stenosis. 2. Doppler velocities suggest a less than 40% stenosis of the right proximal internal carotid artery.    Compared to the previous exam:  No previous duplex exam at this facility available for comparison.        Assessment: Francis Brown is a 63 y.o. male who is s/p L CEA (Date: 01/18/13) for >80% stenosis. He is asymptomatic, no TIA or stroke activity since right before his left CEA. 3. Patent left carotid endarterectomy site with no left internal carotid artery stenosis. 4. Doppler velocities suggest a less than 40% stenosis of the right proximal internal carotid artery. No significant stenosis of the bilateral external and common carotid arteries. No previous duplex exam at this facility available for comparison. He has what seems to be controlled DM, but unfortunately he smokes, but just started Wellbutrin in efforts to quit. He has had 3 MI's.  Plan: Follow-up in 1 year with Carotid Duplex scan.  He was counseled re smoking cessation.  I discussed in depth with the patient the nature of atherosclerosis, and emphasized the importance of maximal medical management including strict control of blood pressure, blood glucose, and lipid levels, obtaining regular exercise, and cessation of smoking.  The patient is aware that without maximal medical management the underlying atherosclerotic disease process will progress, limiting the benefit of any interventions. The patient was given information about stroke prevention and what symptoms should prompt the patient to seek immediate medical care. Thank you for allowing [place] to participate in this patient's care.  Charisse March, RN, MSN,  FNP-C [facility] Office: [phone]  Clinic Physician: [name]  08/19/2013 10:54 AM

## 2013-08-22 ENCOUNTER — Other Ambulatory Visit: Payer: Self-pay | Admitting: *Deleted

## 2013-08-22 MED ORDER — TRIAMTERENE-HCTZ 37.5-25 MG PO CAPS: ORAL_CAPSULE | ORAL | Status: DC

## 2013-09-26 ENCOUNTER — Emergency Department (HOSPITAL_BASED_OUTPATIENT_CLINIC_OR_DEPARTMENT_OTHER): Payer: BC Managed Care – PPO

## 2013-09-26 ENCOUNTER — Encounter (HOSPITAL_BASED_OUTPATIENT_CLINIC_OR_DEPARTMENT_OTHER): Payer: Self-pay | Admitting: Emergency Medicine

## 2013-09-26 ENCOUNTER — Inpatient Hospital Stay (HOSPITAL_BASED_OUTPATIENT_CLINIC_OR_DEPARTMENT_OTHER)
Admission: EM | Admit: 2013-09-26 | Discharge: 2013-09-30 | DRG: 247 | Disposition: A | Discharge status: Home / Self Care | Payer: BC Managed Care – PPO | Attending: Cardiology | Admitting: Cardiology

## 2013-09-26 ENCOUNTER — Telehealth: Payer: Self-pay | Admitting: Internal Medicine

## 2013-09-26 DIAGNOSIS — I25798 Atherosclerosis of other coronary artery bypass graft(s) with other forms of angina pectoris: Secondary | ICD-10-CM

## 2013-09-26 DIAGNOSIS — Z7902 Long term (current) use of antithrombotics/antiplatelets: Secondary | ICD-10-CM

## 2013-09-26 DIAGNOSIS — I25110 Atherosclerotic heart disease of native coronary artery with unstable angina pectoris: Secondary | ICD-10-CM

## 2013-09-26 DIAGNOSIS — Z8673 Personal history of transient ischemic attack (TIA), and cerebral infarction without residual deficits: Secondary | ICD-10-CM

## 2013-09-26 DIAGNOSIS — E860 Dehydration: Secondary | ICD-10-CM | POA: Diagnosis present

## 2013-09-26 DIAGNOSIS — Z8249 Family history of ischemic heart disease and other diseases of the circulatory system: Secondary | ICD-10-CM

## 2013-09-26 DIAGNOSIS — N179 Acute kidney failure, unspecified: Secondary | ICD-10-CM | POA: Diagnosis present

## 2013-09-26 DIAGNOSIS — I252 Old myocardial infarction: Secondary | ICD-10-CM

## 2013-09-26 DIAGNOSIS — Z955 Presence of coronary angioplasty implant and graft: Secondary | ICD-10-CM

## 2013-09-26 DIAGNOSIS — R031 Nonspecific low blood-pressure reading: Secondary | ICD-10-CM | POA: Diagnosis not present

## 2013-09-26 DIAGNOSIS — I10 Essential (primary) hypertension: Secondary | ICD-10-CM | POA: Diagnosis present

## 2013-09-26 DIAGNOSIS — N529 Male erectile dysfunction, unspecified: Secondary | ICD-10-CM | POA: Diagnosis present

## 2013-09-26 DIAGNOSIS — I214 Non-ST elevation (NSTEMI) myocardial infarction: Secondary | ICD-10-CM

## 2013-09-26 DIAGNOSIS — Z87891 Personal history of nicotine dependence: Secondary | ICD-10-CM

## 2013-09-26 DIAGNOSIS — Z9861 Coronary angioplasty status: Secondary | ICD-10-CM

## 2013-09-26 DIAGNOSIS — E785 Hyperlipidemia, unspecified: Secondary | ICD-10-CM | POA: Diagnosis present

## 2013-09-26 DIAGNOSIS — Z79899 Other long term (current) drug therapy: Secondary | ICD-10-CM

## 2013-09-26 DIAGNOSIS — Z7982 Long term (current) use of aspirin: Secondary | ICD-10-CM

## 2013-09-26 DIAGNOSIS — I2582 Chronic total occlusion of coronary artery: Secondary | ICD-10-CM | POA: Diagnosis present

## 2013-09-26 DIAGNOSIS — I25810 Atherosclerosis of coronary artery bypass graft(s) without angina pectoris: Secondary | ICD-10-CM | POA: Diagnosis present

## 2013-09-26 DIAGNOSIS — E119 Type 2 diabetes mellitus without complications: Secondary | ICD-10-CM | POA: Diagnosis present

## 2013-09-26 DIAGNOSIS — I2510 Atherosclerotic heart disease of native coronary artery without angina pectoris: Secondary | ICD-10-CM | POA: Diagnosis present

## 2013-09-26 LAB — CBC WITH DIFFERENTIAL/PLATELET
BASOS PCT: 0 % (ref 0–1)
Basophils Absolute: 0 10*3/uL (ref 0.0–0.1)
EOS ABS: 0.1 10*3/uL (ref 0.0–0.7)
EOS PCT: 1 % (ref 0–5)
HEMATOCRIT: 45.1 % (ref 39.0–52.0)
HEMOGLOBIN: 16 g/dL (ref 13.0–17.0)
Lymphocytes Relative: 16 % (ref 12–46)
Lymphs Abs: 1.8 10*3/uL (ref 0.7–4.0)
MCH: 34 pg (ref 26.0–34.0)
MCHC: 35.5 g/dL (ref 30.0–36.0)
MCV: 96 fL (ref 78.0–100.0)
MONO ABS: 1.1 10*3/uL — AB (ref 0.1–1.0)
Monocytes Relative: 10 % (ref 3–12)
NEUTROS ABS: 8 10*3/uL — AB (ref 1.7–7.7)
NEUTROS PCT: 73 % (ref 43–77)
Platelets: 191 10*3/uL (ref 150–400)
RBC: 4.7 MIL/uL (ref 4.22–5.81)
RDW: 13.9 % (ref 11.5–15.5)
WBC: 11 10*3/uL — ABNORMAL HIGH (ref 4.0–10.5)

## 2013-09-26 LAB — BASIC METABOLIC PANEL
BUN: 21 mg/dL (ref 6–23)
CHLORIDE: 97 meq/L (ref 96–112)
CO2: 26 mEq/L (ref 19–32)
Calcium: 9.9 mg/dL (ref 8.4–10.5)
Creatinine, Ser: 1.9 mg/dL — ABNORMAL HIGH (ref 0.50–1.35)
GFR calc non Af Amer: 36 mL/min — ABNORMAL LOW (ref >90)
GFR, EST AFRICAN AMERICAN: 42 mL/min — AB (ref >90)
GLUCOSE: 135 mg/dL — AB (ref 70–99)
POTASSIUM: 4.1 meq/L (ref 3.7–5.3)
Sodium: 137 mEq/L (ref 137–147)

## 2013-09-26 LAB — PROTIME-INR
INR: 1.05 (ref 0.00–1.49)
Prothrombin Time: 13.5 seconds (ref 11.6–15.2)

## 2013-09-26 LAB — APTT: APTT: 27 s (ref 24–37)

## 2013-09-26 LAB — TROPONIN I: Troponin I: 1.46 ng/mL (ref <0.30)

## 2013-09-26 MED ORDER — HEPARIN (PORCINE) IN NACL 100-0.45 UNIT/ML-% IJ SOLN
1500.0000 [IU]/h | INTRAMUSCULAR | Status: DC
  Administered 2013-09-26: 1500 [IU]/h via INTRAVENOUS
  Filled 2013-09-26 (×2): qty 250

## 2013-09-26 MED ORDER — HEPARIN (PORCINE) IN NACL 100-0.45 UNIT/ML-% IJ SOLN
INTRAMUSCULAR | Status: AC
  Filled 2013-09-26: qty 250

## 2013-09-26 MED ORDER — HEPARIN BOLUS VIA INFUSION
4000.0000 [IU] | Freq: Once | INTRAVENOUS | Status: AC
  Administered 2013-09-26: 4000 [IU] via INTRAVENOUS

## 2013-09-26 MED ORDER — MORPHINE SULFATE 4 MG/ML IJ SOLN
4.0000 mg | Freq: Once | INTRAMUSCULAR | Status: AC
  Administered 2013-09-26: 4 mg via INTRAVENOUS

## 2013-09-26 MED ORDER — NITROGLYCERIN 2 % TD OINT
1.0000 [in_us] | TOPICAL_OINTMENT | Freq: Once | TRANSDERMAL | Status: DC
  Filled 2013-09-26: qty 1

## 2013-09-26 MED ORDER — NITROGLYCERIN IN D5W 200-5 MCG/ML-% IV SOLN
2.0000 ug/min | Freq: Once | INTRAVENOUS | Status: AC
  Administered 2013-09-26: 5 ug/min via INTRAVENOUS
  Filled 2013-09-26: qty 250

## 2013-09-26 MED ORDER — CLOPIDOGREL BISULFATE 300 MG PO TABS
600.0000 mg | ORAL_TABLET | Freq: Once | ORAL | Status: AC
  Administered 2013-09-26: 600 mg via ORAL
  Filled 2013-09-26: qty 2

## 2013-09-26 MED ORDER — MORPHINE SULFATE 4 MG/ML IJ SOLN
INTRAMUSCULAR | Status: AC
  Filled 2013-09-26: qty 1

## 2013-09-26 MED ORDER — SODIUM CHLORIDE 0.9 % IV SOLN: Freq: Once | INTRAVENOUS | Status: DC

## 2013-09-26 MED ORDER — ASPIRIN 81 MG PO CHEW
324.0000 mg | CHEWABLE_TABLET | Freq: Once | ORAL | Status: AC
  Administered 2013-09-26: 81 mg via ORAL
  Filled 2013-09-26: qty 4

## 2013-09-26 NOTE — ED Notes (Signed)
EDP [facility] notified of critical Trop 1.46

## 2013-09-26 NOTE — ED Notes (Signed)
MD at bedside. 

## 2013-09-26 NOTE — Telephone Encounter (Signed)
PCP:   SwazilandJORDAN, [name], MD   Primary Cardiologist:  Armanda Magicraci Turner with [facility]   Chief Complaint: chest pain   HPI: Francis Brown is a 63 y.o. male , with a h/o MI, s/p CABGx5 and subsequent stenting, who presented to the Emergency Department at [place] complaining of constant L sided chest pain onset 3 hours prior to presentation.    Review of Systems:  12 systems were reviewed and were negative except mentioned in the HPI  Past Medical History: Past Medical History  Diagnosis Date  . Hypertension   . Coronary artery disease     a. 1996 s/p MI [place] [place];  b. 1998 [place]/p [place] CABGx5 in [place];  b. 2010 s/p stenting to the OM in [place] (Xience 4.0x12 DES, Xience 4.0x598mm DES)  . Erectile dysfunction   . Stroke     a. 2005;  b. 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  . Carotid disease, bilateral     a. 01/2013 Carotid U/S: RICA 1-39%, LICA 80-99%. Antegrade vertebral flow.  . Tobacco abuse   . Hyperlipidemia   . Bradycardia, drug induced   . CHF (congestive heart failure)   . Diabetes mellitus without complication 2015   Past Surgical History  Procedure Laterality Date  . Cardiac surgery    . Coronary stent placement    . Coronary artery bypass graft    . Coronary angioplasty    . Colonoscopy    . Endarterectomy Left 01/18/2013    Procedure: LEFT CAROTID ENDARTERECTOMY WITH PATCH ANGIOPLASTY;  Surgeon: Fransisco HertzBrian L Chen, MD;  Location: [facility] OR;  Service: Vascular;  Laterality: Left;  . Carotid endarterectomy Left 01-18-13    cea    Medications: Prior to Admission medications   Medication Sig Start Date End Date Taking? Authorizing Provider  aspirin EC 81 MG tablet Take 81 mg by mouth daily.    Historical Provider, MD  atorvastatin (LIPITOR) 40 MG tablet TAKE 1 TABLET BY MOUTH EVERY DAY 03/22/13   Quintella Reichertraci R Turner, MD  BAYER CONTOUR NEXT TEST test strip  07/13/13   Historical Provider, MD  BAYER MICROLET LANCETS  lancets  07/13/13   Historical Provider, MD  buPROPion (WELLBUTRIN SR) 150 MG 12 hr tablet Take 150 mg by mouth 2 (two) times daily.  08/15/13   Historical Provider, MD  CIALIS 20 MG tablet  12/30/12   Historical Provider, MD  clopidogrel (PLAVIX) 75 MG tablet Take 1 tablet (75 mg total) by mouth daily. 01/13/13   Ok Anishristopher R Berge, NP  diazepam (VALIUM) 2 MG tablet  01/14/13   Historical Provider, MD  FLUoxetine HCl (PROZAC PO) Take by mouth as needed.    Historical Provider, MD  hydrOXYzine (ATARAX/VISTARIL) 25 MG tablet  08/15/13   Historical Provider, MD  lisinopril (PRINIVIL,ZESTRIL) 10 MG tablet TAKE 1 TABLET ONCE A DAY ORALLY 04/15/13   Quintella Reichertraci R Turner, MD  oxyCODONE (ROXICODONE) 5 MG immediate release tablet Take 1 tablet (5 mg total) by mouth every 6 (six) hours as needed for pain. 01/18/13   Samantha J Rhyne, PA-C  Pyrithione Zinc (DERMAZINC SPRAY EX) Apply 1 application topically daily.    Historical Provider, MD  triamterene-hydrochlorothiazide (DYAZIDE) 37.5-25 MG per capsule TAKE 1 CAPSULE IN THE MORNING ONCE A DAY ORALLY 08/22/13   Quintella Reichertraci R Turner, MD  VIAGRA 100 MG tablet  07/08/13   Historical Provider, MD    Allergies:  No Known Allergies  Social  History:  reports that he quit smoking about 10 years ago. His smoking use included Cigarettes. He has a 24 pack-year smoking history. He has never used smokeless tobacco. He reports that he drinks alcohol. He reports that he does not use illicit drugs.  Family History: Family History  Problem Relation Age of Onset  . CAD Father     died @ 8665  . Heart disease Father     Before 61 years old  . Hyperlipidemia Father   . Heart attack Father   . Other Mother     died @ 6071  . Heart disease Mother   . Hyperlipidemia Mother   . Varicose Veins Mother     PHYSICAL EXAM:  @VITALSM @ ????? General:  Well appearing. No respiratory difficulty HEENT: normal Neck: supple. no JVD. Carotids 2+ bilat; no bruits. No lymphadenopathy or thryomegaly  appreciated. Cor: PMI nondisplaced. Regular rate & rhythm. No rubs, gallops or murmurs. Lungs: clear Abdomen: soft, nontender, nondistended. No hepatosplenomegaly. No bruits or masses. Good bowel sounds. Extremities: no cyanosis, clubbing, rash, edema Neuro: alert & oriented x 3, cranial nerves grossly intact. moves all 4 extremities w/o difficulty. Affect pleasant.  Labs on Admission:   Recent Labs  09/26/13 2127  NA 137  K 4.1  CL 97  CO2 26  GLUCOSE 135*  BUN 21  CREATININE 1.90*  CALCIUM 9.9   No results found for this basename: AST, ALT, ALKPHOS, BILITOT, PROT, ALBUMIN,  in the last 72 hours No results found for this basename: LIPASE, AMYLASE,  in the last 72 hours  Recent Labs  09/26/13 2127  WBC 11.0*  NEUTROABS 8.0*  HGB 16.0  HCT 45.1  MCV 96.0  PLT 191    Recent Labs  09/26/13 2127  TROPONINI 1.46*   No results found for this basename: TSH, T4TOTAL, FREET3, T3FREE, THYROIDAB,  in the last 72 hours No results found for this basename: VITAMINB12, FOLATE, FERRITIN, TIBC, IRON, RETICCTPCT,  in the last 72 hours  Radiological Exams on Admission (all images were personally reviewed and interpreted by me, radiology reports were reviewed as well): Dg Chest 2 View  09/26/2013   CLINICAL DATA:  Left-sided chest pain.  EXAM: CHEST  2 VIEW  COMPARISON:  01/18/2013  FINDINGS: Sternotomy wires are unchanged. Lungs are hypoinflated without definite consolidation or effusion. Cardiomediastinal silhouette and remainder of the exam is unchanged.  IMPRESSION: No active cardiopulmonary disease.   Electronically Signed   By: Elberta Fortisaniel  Boyle M.D.   On: 09/26/2013 21:51    Last ECHO 01/12/2013:  Left ventricle: The cavity size was normal. Systolic function was normal. The estimated ejection fraction was in the range of 55% to 60%. Wall motion was normal; there were no regional wall motion abnormalities.  Left atrium: The atrium was mildly dilated.  EKG personally reviewed and  interpreted by me  Assessment/Plan  NSTEMI Acute renal failure

## 2013-09-26 NOTE — ED Notes (Signed)
Family at bedside. 

## 2013-09-26 NOTE — ED Provider Notes (Addendum)
CSN: [id number]     Arrival date & time 09/26/13  2111 History  This chart was scribed for Shon Batonourtney F Javae Braaten, MD by Charline BillsEssence Howell, ED Scribe. The patient was seen in room MH10/MH10. Patient's care was started at 9:43 PM.   Chief Complaint  Patient presents with  . Chest Pain   The history is provided by the patient. No language interpreter was used.   HPI Comments: Francis Brown is a 63 y.o. male , with a h/o MI, CHF, who presents to the Emergency Department complaining of constant L sided chest pain onset 3 hours ago during dinner. Pt describes the quality of pain as an "achy tinge". He currently rates the pain 2/10. Denies exertional component. Pt reports abdominal fullness despite the light dinner he had. He denies SOB, nausea, vomiting, cough, fever. Pt states that this pain is not similar to previous MI pain. He attributes pain to stress at dinner. Pt has taken an ASA today. He also states that he took hydroxyzine when he returned home from dinner. Pt also states that he has a stress release pill that he takes p.r.n. Endarterectomy 01/18/13.   Review of patient's records showed patient had a CABG in 1996 as well as PCI in 2010 with 2 cardiac stents placed.  Cardiologist: Armanda Magicraci Turner with [facility]   Past Medical History  Diagnosis Date  . Hypertension   . Coronary artery disease     a. 1996 s/p MI [place] [place];  b. 1998 s/[place] MI in [place] CABGx5 in [place];  b. 2010 s/p stenting to the OM in [place] (Xience 4.0x12 DES, Xience 4.0x678mm DES)  . Erectile dysfunction   . Stroke     a. 2005;  b. 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  . Carotid disease, bilateral     a. 01/2013 Carotid U/S: RICA 1-39%, LICA 80-99%. Antegrade vertebral flow.  . Tobacco abuse   . Hyperlipidemia   . Bradycardia, drug induced   . CHF (congestive heart failure)   . Diabetes mellitus without complication 2015   Past Surgical History  Procedure Laterality Date   . Cardiac surgery    . Coronary stent placement    . Coronary artery bypass graft    . Coronary angioplasty    . Colonoscopy    . Endarterectomy Left 01/18/2013    Procedure: LEFT CAROTID ENDARTERECTOMY WITH PATCH ANGIOPLASTY;  Surgeon: Fransisco HertzBrian L Chen, MD;  [facility];  Service: Vascular;  Laterality: Left;  . Carotid endarterectomy Left 01-18-13    cea   Family History  Problem Relation Age of Onset  . CAD Father     died @ 5965  . Heart disease Father     Before 61 years old  . Hyperlipidemia Father   . Heart attack Father   . Other Mother     died @ 1171  . Heart disease Mother   . Hyperlipidemia Mother   . Varicose Veins Mother    History  Substance Use Topics  . Smoking status: Former Smoker -- 0.80 packs/day for 30 years    Types: Cigarettes    Quit date: 08/20/2003  . Smokeless tobacco: Never Used  . Alcohol Use: Yes     Comment: occ    Review of Systems  Constitutional: Negative.  Negative for fever.  Respiratory: Positive for chest tightness. Negative for cough and shortness of breath.   Cardiovascular: Positive for chest pain. Negative for leg swelling.  Gastrointestinal:  Negative.  Negative for nausea, vomiting and abdominal pain.  Genitourinary: Negative.  Negative for dysuria.  Neurological: Negative for headaches.  All other systems reviewed and are negative.   Allergies  Review of patient's allergies indicates no known allergies.  Home Medications   Prior to Admission medications   Medication Sig Start Date End Date Taking? Authorizing Provider  aspirin EC 81 MG tablet Take 81 mg by mouth daily.    Historical Provider, MD  atorvastatin (LIPITOR) 40 MG tablet TAKE 1 TABLET BY MOUTH EVERY DAY 03/22/13   Quintella Reichert, MD  BAYER CONTOUR NEXT TEST test strip  07/13/13   Historical Provider, MD  BAYER MICROLET LANCETS lancets  07/13/13   Historical Provider, MD  buPROPion (WELLBUTRIN SR) 150 MG 12 hr tablet Take 150 mg by mouth 2 (two) times daily.  08/15/13    Historical Provider, MD  CIALIS 20 MG tablet  12/30/12   Historical Provider, MD  clopidogrel (PLAVIX) 75 MG tablet Take 1 tablet (75 mg total) by mouth daily. 01/13/13   Ok Anis, NP  diazepam (VALIUM) 2 MG tablet  01/14/13   Historical Provider, MD  FLUoxetine HCl (PROZAC PO) Take by mouth as needed.    Historical Provider, MD  hydrOXYzine (ATARAX/VISTARIL) 25 MG tablet  08/15/13   Historical Provider, MD  lisinopril (PRINIVIL,ZESTRIL) 10 MG tablet TAKE 1 TABLET ONCE A DAY ORALLY 04/15/13   Quintella Reichert, MD  oxyCODONE (ROXICODONE) 5 MG immediate release tablet Take 1 tablet (5 mg total) by mouth every 6 (six) hours as needed for pain. 01/18/13   Samantha J Rhyne, PA-C  Pyrithione Zinc (DERMAZINC SPRAY EX) Apply 1 application topically daily.    Historical Provider, MD  triamterene-hydrochlorothiazide (DYAZIDE) 37.5-25 MG per capsule TAKE 1 CAPSULE IN THE MORNING ONCE A DAY ORALLY 08/22/13   Quintella Reichert, MD  VIAGRA 100 MG tablet  07/08/13   Historical Provider, MD   Triage Vitals: BP 146/95  Pulse 90  Temp(Src) 97.9 F (36.6 C) (Oral)  Resp 18  Ht 6' (1.829 m)  Wt 230 lb (104.327 kg)  BMI 31.19 kg/m2  SpO2 96% Physical Exam  Nursing note and vitals reviewed. Constitutional: He is oriented to person, place, and time. He appears well-developed and well-nourished. No distress.  HENT:  Head: Normocephalic and atraumatic.  Eyes: Pupils are equal, round, and reactive to light.  Neck: Neck supple.  Cardiovascular: Normal rate, regular rhythm and normal heart sounds.   No murmur heard. Pulmonary/Chest: Effort normal and breath sounds normal. No respiratory distress. He has no wheezes.  Midline sternotomy scar  Abdominal: Soft. Bowel sounds are normal. There is no tenderness. There is no rebound and no guarding.  Protuberant  Musculoskeletal: He exhibits no edema.  Lymphadenopathy:    He has no cervical adenopathy.  Neurological: He is alert and oriented to person, place, and  time.  Skin: Skin is warm and dry.  Psychiatric: He has a normal mood and affect.    ED Course  Procedures (including critical care time)  CRITICAL CARE Performed by: Ross Marcus, F   Total critical care time: 45 min  Critical care time was exclusive of separately billable procedures and treating other patients.  Critical care was necessary to treat or prevent imminent or life-threatening deterioration.  Critical care was time spent personally by me on the following activities: development of treatment plan with patient and/or surrogate as well as nursing, discussions with consultants, evaluation of patient's response to treatment, examination  of patient, obtaining history from patient or surrogate, ordering and performing treatments and interventions, ordering and review of laboratory studies, ordering and review of radiographic studies, pulse oximetry and re-evaluation of patient's condition.  DIAGNOSTIC STUDIES: Oxygen Saturation is 96% on RA, adequate by my interpretation.    COORDINATION OF CARE: 9:50 PM Discussed treatment plan with pt at bedside and pt agreed to plan.  Labs Review Labs Reviewed  CBC WITH DIFFERENTIAL - Abnormal; Notable for the following:    WBC 11.0 (*)    Neutro Abs 8.0 (*)    Monocytes Absolute 1.1 (*)    All other components within normal limits  BASIC METABOLIC PANEL - Abnormal; Notable for the following:    Glucose, Bld 135 (*)    Creatinine, Ser 1.90 (*)    GFR calc non Af Amer 36 (*)    GFR calc Af Amer 42 (*)    All other components within normal limits  TROPONIN I - Abnormal; Notable for the following:    Troponin I 1.46 (*)    All other components within normal limits  PROTIME-INR  APTT   Imaging Review Dg Chest 2 View  09/26/2013   CLINICAL DATA:  Left-sided chest pain.  EXAM: CHEST  2 VIEW  COMPARISON:  01/18/2013  FINDINGS: Sternotomy wires are unchanged. Lungs are hypoinflated without definite consolidation or effusion.  Cardiomediastinal silhouette and remainder of the exam is unchanged.  IMPRESSION: No active cardiopulmonary disease.   Electronically Signed   By: Elberta Fortisaniel  Boyle M.D.   On: 09/26/2013 21:51    EKG Interpretation   Date/Time:  Monday September 26 2013 22:38:30 EDT Ventricular Rate:  93 PR Interval:  170 QRS Duration: 88 QT Interval:  360 QTC Calculation: 447 R Axis:   -35 Text Interpretation:  Normal sinus rhythm Left axis deviation Possible  Lateral infarct , age undetermined Inferior infarct , age undetermined  Abnormal ECG Lateral changes new, No ST Elevation Confirmed by Kenyia Wambolt  MD,  Brittanni Cariker ([id number]) on 09/26/2013 10:41:08 PM    Of note, this was the second EKG because of difficulty uploading the first thru MUSE.  First EKG showed no ST elevation and old lateral infarct which was mostly unchanged from prior.   MDM   Final diagnoses:  NSTEMI (non-ST elevated myocardial infarction)    Patient presents with chest pain. Onset approximately 3 hours ago. She states that it is different than his prior MI symptoms.  However he has an extensive history of coronary artery disease and vascular disease. Her pain is 2/10. He is nontoxic on exam.  Vital signs notable for a blood pressure of 156/95. EKG shows no evidence of ST elevation.  Patient was given full dose aspirin.    10:01 PM Positive troponin. Patient placed on heparin drip. Cardiology consulted.  Patient reports no use of erectile dysfunction drugs including Viagra or Cialis in the last 5 days. Nitroglycerin drip started given ongoing pain.  Per cardiology, will also load patient with Plavix, 600 mg. Patient was accepted by the cardiology service. He will be admitted to the CCU.  Patient also noted to have an acute elevation in his creatinine.  Baseline around 1.1. Elevated to 1.9. Fluids initiated.  I personally performed the services described in this documentation, which was scribed in my presence. The recorded information has been  reviewed and is accurate.    Shon Batonourtney F Veona Bittman, MD 09/26/13 [id number]  Shon Batonourtney F Belkis Norbeck, MD 09/26/13 [id number]

## 2013-09-26 NOTE — ED Notes (Addendum)
Pt c/o left sided chest pain described as aching dull x 3 hrs denies sob n/v, HX MI x 3 , pt reports does not feel the same, pt also reports increased stress at work and divorce

## 2013-09-26 NOTE — Progress Notes (Signed)
ANTICOAGULATION CONSULT NOTE - Initial Consult  Pharmacy Consult for Heparin  Indication: chest pain/ACS  No Known Allergies  Patient Measurements: Height: 6' (182.9 cm) Weight: 230 lb (104.327 kg) IBW/kg (Calculated) : 77.6 Heparin Dosing Weight: 98 kg  Vital Signs: Temp: 97.9 F (36.6 C) (06/15 2118) Temp src: Oral (06/15 2118) BP: 156/95 mmHg (06/15 2206) Pulse Rate: 86 (06/15 2206)  Labs:  Recent Labs  09/26/13 2127  HGB 16.0  HCT 45.1  PLT 191  CREATININE 1.90*  TROPONINI 1.46*    Estimated Creatinine Clearance: 51 ml/min (by C-G formula based on Cr of 1.9).   Medical History: Past Medical History  Diagnosis Date  . Hypertension   . Coronary artery disease     a. 1996 s/p MI [place] Rx;  b. 1998 s/p MI in [place] CABGx5 in [place];  b. 2010 s/p stenting to the OM in [place], [place] (Xience 4.0x12 DES, Xience 4.0x678mm DES)  . Erectile dysfunction   . Stroke     a. 2005;  b. 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  . Carotid disease, bilateral     a. 01/2013 Carotid U/S: RICA 1-39%, LICA 80-99%. Antegrade vertebral flow.  . Tobacco abuse   . Hyperlipidemia   . Bradycardia, drug induced   . CHF (congestive heart failure)   . Diabetes mellitus without complication 2015    Medications:   (Not in a hospital admission)  Assessment: 63 yo M admitted 09/26/2013 with CP and a positive troponin.  Pharmacy consulted to dose heparin.  PMH: HTN, HLD, CAD  Coag: NSTEMI, +troponin, CBC wnl to start heparin  Goal of Therapy:  Heparin level 0.3-0.7 units/ml Monitor platelets by anticoagulation protocol: Yes   Plan:  Give 4000 units bolus x 1 Start heparin infusion at 1500 units/hr Check anti-Xa level in 6 hours and daily while on heparin Continue to monitor H&H and platelets  Thank you for allowing pharmacy to be a part of this patients care team.  Lovenia KimJulie Kaynan Klonowski Pharm.D., BCPS, AQ-Cardiology Clinical Pharmacist 09/26/2013  10:14 PM Pager: [phone] Phone: [phone]

## 2013-09-26 NOTE — ED Notes (Signed)
Morphine 4 mg given by  carelink  Pain remains 2-3

## 2013-09-26 NOTE — ED Notes (Signed)
Spoke with [facility]-to dose heparin

## 2013-09-26 NOTE — ED Notes (Signed)
EDP advised that EKG did not cross over to MUSE-EDP ordered another EKG to be done

## 2013-09-27 ENCOUNTER — Encounter (HOSPITAL_COMMUNITY)
Admission: EM | Disposition: A | Discharge status: Home / Self Care | Payer: Self-pay | Source: Home / Self Care | Attending: Cardiology

## 2013-09-27 DIAGNOSIS — I214 Non-ST elevation (NSTEMI) myocardial infarction: Secondary | ICD-10-CM

## 2013-09-27 DIAGNOSIS — N179 Acute kidney failure, unspecified: Secondary | ICD-10-CM

## 2013-09-27 DIAGNOSIS — I10 Essential (primary) hypertension: Secondary | ICD-10-CM

## 2013-09-27 DIAGNOSIS — R072 Precordial pain: Secondary | ICD-10-CM

## 2013-09-27 DIAGNOSIS — I2510 Atherosclerotic heart disease of native coronary artery without angina pectoris: Secondary | ICD-10-CM

## 2013-09-27 DIAGNOSIS — E785 Hyperlipidemia, unspecified: Secondary | ICD-10-CM

## 2013-09-27 HISTORY — PX: LEFT HEART CATH: SHX5946

## 2013-09-27 HISTORY — PX: LEFT HEART CATHETERIZATION WITH CORONARY ANGIOGRAM: SHX5451

## 2013-09-27 LAB — PROTIME-INR
INR: 1.1 (ref 0.00–1.49)
Prothrombin Time: 14 seconds (ref 11.6–15.2)

## 2013-09-27 LAB — HEMOGLOBIN A1C
HEMOGLOBIN A1C: 6.8 % — AB (ref <5.7)
MEAN PLASMA GLUCOSE: 148 mg/dL — AB (ref <117)

## 2013-09-27 LAB — CBC
HCT: 39.9 % (ref 39.0–52.0)
HEMOGLOBIN: 13.6 g/dL (ref 13.0–17.0)
MCH: 32.3 pg (ref 26.0–34.0)
MCHC: 34.1 g/dL (ref 30.0–36.0)
MCV: 94.8 fL (ref 78.0–100.0)
PLATELETS: 182 10*3/uL (ref 150–400)
RBC: 4.21 MIL/uL — ABNORMAL LOW (ref 4.22–5.81)
RDW: 13.9 % (ref 11.5–15.5)
WBC: 12.9 10*3/uL — AB (ref 4.0–10.5)

## 2013-09-27 LAB — LIPID PANEL
Cholesterol: 122 mg/dL (ref 0–200)
HDL: 33 mg/dL — AB (ref >39)
LDL Cholesterol: 65 mg/dL (ref 0–99)
TRIGLYCERIDES: 119 mg/dL (ref <150)
Total CHOL/HDL Ratio: 3.7 RATIO
VLDL: 24 mg/dL (ref 0–40)

## 2013-09-27 LAB — BASIC METABOLIC PANEL
BUN: 20 mg/dL (ref 6–23)
CHLORIDE: 99 meq/L (ref 96–112)
CO2: 26 meq/L (ref 19–32)
CREATININE: 1.78 mg/dL — AB (ref 0.50–1.35)
Calcium: 9 mg/dL (ref 8.4–10.5)
GFR calc Af Amer: 46 mL/min — ABNORMAL LOW (ref >90)
GFR calc non Af Amer: 39 mL/min — ABNORMAL LOW (ref >90)
Glucose, Bld: 164 mg/dL — ABNORMAL HIGH (ref 70–99)
Potassium: 4.3 mEq/L (ref 3.7–5.3)
Sodium: 138 mEq/L (ref 137–147)

## 2013-09-27 LAB — GLUCOSE, CAPILLARY
Glucose-Capillary: 157 mg/dL — ABNORMAL HIGH (ref 70–99)
Glucose-Capillary: 134 mg/dL — ABNORMAL HIGH (ref 70–99)
Glucose-Capillary: 113 mg/dL — ABNORMAL HIGH (ref 70–99)
Glucose-Capillary: 138 mg/dL — ABNORMAL HIGH (ref 70–99)

## 2013-09-27 LAB — TROPONIN I: Troponin I: 11.86 ng/mL (ref <0.30)

## 2013-09-27 LAB — HEPARIN LEVEL (UNFRACTIONATED): Heparin Unfractionated: 0.23 IU/mL — ABNORMAL LOW (ref 0.30–0.70)

## 2013-09-27 LAB — MRSA PCR SCREENING: MRSA by PCR: NEGATIVE

## 2013-09-27 LAB — TSH: TSH: 5.33 u[IU]/mL — ABNORMAL HIGH (ref 0.350–4.500)

## 2013-09-27 LAB — MAGNESIUM: Magnesium: 1.8 mg/dL (ref 1.5–2.5)

## 2013-09-27 SURGERY — LEFT HEART CATHETERIZATION WITH CORONARY ANGIOGRAM
Anesthesia: LOCAL

## 2013-09-27 MED ORDER — METOPROLOL TARTRATE 12.5 MG HALF TABLET
12.5000 mg | ORAL_TABLET | Freq: Two times a day (BID) | ORAL | Status: DC
Start: 2013-09-27 — End: 2013-09-27
  Filled 2013-09-27 (×3): qty 1

## 2013-09-27 MED ORDER — ASPIRIN EC 81 MG PO TBEC
81.0000 mg | DELAYED_RELEASE_TABLET | Freq: Every day | ORAL | Status: DC
  Administered 2013-09-27 – 2013-09-28 (×2): 81 mg via ORAL
  Filled 2013-09-27 (×2): qty 1

## 2013-09-27 MED ORDER — VERAPAMIL HCL 2.5 MG/ML IV SOLN
INTRAVENOUS | Status: AC
  Filled 2013-09-27: qty 2

## 2013-09-27 MED ORDER — SODIUM CHLORIDE 0.9 % IV SOLN: INTRAVENOUS | Status: DC

## 2013-09-27 MED ORDER — BUPROPION HCL ER (SR) 150 MG PO TB12
150.0000 mg | ORAL_TABLET | Freq: Two times a day (BID) | ORAL | Status: DC
  Administered 2013-09-27 – 2013-09-30 (×6): 150 mg via ORAL
  Filled 2013-09-27 (×11): qty 1

## 2013-09-27 MED ORDER — SODIUM CHLORIDE 0.9 % IJ SOLN: 3.0000 mL | Freq: Two times a day (BID) | INTRAMUSCULAR | Status: DC

## 2013-09-27 MED ORDER — INSULIN ASPART 100 UNIT/ML ~~LOC~~ SOLN: 0.0000 [IU] | Freq: Every day | SUBCUTANEOUS | Status: DC

## 2013-09-27 MED ORDER — HEPARIN (PORCINE) IN NACL 100-0.45 UNIT/ML-% IJ SOLN
1700.0000 [IU]/h | INTRAMUSCULAR | Status: DC
  Filled 2013-09-27: qty 250

## 2013-09-27 MED ORDER — INSULIN ASPART 100 UNIT/ML ~~LOC~~ SOLN
0.0000 [IU] | Freq: Three times a day (TID) | SUBCUTANEOUS | Status: DC
Start: 2013-09-27 — End: 2013-09-30
  Administered 2013-09-27: 1 [IU] via SUBCUTANEOUS
  Administered 2013-09-28 – 2013-09-29 (×3): 2 [IU] via SUBCUTANEOUS
  Administered 2013-09-30: 1 [IU] via SUBCUTANEOUS

## 2013-09-27 MED ORDER — NITROGLYCERIN 0.4 MG SL SUBL: 0.4000 mg | SUBLINGUAL_TABLET | SUBLINGUAL | Status: DC | PRN

## 2013-09-27 MED ORDER — METOPROLOL TARTRATE 25 MG PO TABS
25.0000 mg | ORAL_TABLET | ORAL | Status: AC
  Administered 2013-09-27: 25 mg via ORAL
  Filled 2013-09-27 (×2): qty 1

## 2013-09-27 MED ORDER — OXYCODONE HCL 5 MG PO TABS: 5.0000 mg | ORAL_TABLET | Freq: Four times a day (QID) | ORAL | Status: DC | PRN

## 2013-09-27 MED ORDER — LACTATED RINGERS IV SOLN
INTRAVENOUS | Status: AC
  Administered 2013-09-27 (×2): via INTRAVENOUS

## 2013-09-27 MED ORDER — DIAZEPAM 5 MG PO TABS: 2.5000 mg | ORAL_TABLET | Freq: Four times a day (QID) | ORAL | Status: DC | PRN

## 2013-09-27 MED ORDER — HEPARIN (PORCINE) IN NACL 2-0.9 UNIT/ML-% IJ SOLN
INTRAMUSCULAR | Status: AC
  Filled 2013-09-27: qty 1000

## 2013-09-27 MED ORDER — ATORVASTATIN CALCIUM 80 MG PO TABS
80.0000 mg | ORAL_TABLET | Freq: Every day | ORAL | Status: DC
  Administered 2013-09-27 – 2013-09-29 (×3): 80 mg via ORAL
  Filled 2013-09-27 (×5): qty 1

## 2013-09-27 MED ORDER — NITROGLYCERIN 0.2 MG/ML ON CALL CATH LAB
INTRAVENOUS | Status: AC
  Filled 2013-09-27: qty 1

## 2013-09-27 MED ORDER — SODIUM CHLORIDE 0.9 % IJ SOLN
3.0000 mL | INTRAMUSCULAR | Status: DC | PRN
Start: 2013-09-27 — End: 2013-09-27

## 2013-09-27 MED ORDER — ASPIRIN 81 MG PO CHEW: 81.0000 mg | CHEWABLE_TABLET | ORAL | Status: DC

## 2013-09-27 MED ORDER — METOPROLOL TARTRATE 25 MG PO TABS
25.0000 mg | ORAL_TABLET | Freq: Two times a day (BID) | ORAL | Status: DC
  Administered 2013-09-27 – 2013-09-30 (×5): 25 mg via ORAL
  Filled 2013-09-27 (×9): qty 1

## 2013-09-27 MED ORDER — FENTANYL CITRATE 0.05 MG/ML IJ SOLN
INTRAMUSCULAR | Status: AC
  Filled 2013-09-27: qty 2

## 2013-09-27 MED ORDER — CLOPIDOGREL BISULFATE 75 MG PO TABS
75.0000 mg | ORAL_TABLET | Freq: Every day | ORAL | Status: DC
  Administered 2013-09-27 – 2013-09-30 (×5): 75 mg via ORAL
  Filled 2013-09-27 (×6): qty 1

## 2013-09-27 MED ORDER — SODIUM CHLORIDE 0.9 % IV SOLN
250.0000 mL | INTRAVENOUS | Status: DC | PRN
Start: 2013-09-27 — End: 2013-09-27

## 2013-09-27 MED ORDER — LIDOCAINE HCL (PF) 1 % IJ SOLN
INTRAMUSCULAR | Status: AC
  Filled 2013-09-27: qty 30

## 2013-09-27 MED ORDER — SODIUM CHLORIDE 0.9 % IV SOLN
1.0000 mL/kg/h | INTRAVENOUS | Status: AC
  Administered 2013-09-27 (×2): 1 mL/kg/h via INTRAVENOUS

## 2013-09-27 MED ORDER — NITROGLYCERIN IN D5W 200-5 MCG/ML-% IV SOLN: 2.0000 ug/min | INTRAVENOUS | Status: DC

## 2013-09-27 MED ORDER — HEPARIN (PORCINE) IN NACL 100-0.45 UNIT/ML-% IJ SOLN
1850.0000 [IU]/h | INTRAMUSCULAR | Status: DC
  Administered 2013-09-27: 1700 [IU]/h via INTRAVENOUS
  Administered 2013-09-28 (×2): 1850 [IU]/h via INTRAVENOUS
  Filled 2013-09-27 (×6): qty 250

## 2013-09-27 MED ORDER — MIDAZOLAM HCL 2 MG/2ML IJ SOLN
INTRAMUSCULAR | Status: AC
  Filled 2013-09-27: qty 2

## 2013-09-27 MED ORDER — DIAZEPAM 2 MG PO TABS: 2.0000 mg | ORAL_TABLET | Freq: Four times a day (QID) | ORAL | Status: DC | PRN

## 2013-09-27 NOTE — Op Note (Signed)
    Cardiac Catheterization Procedure Note  Name: Francis BelliniJames R Sweeten MRN: [id number] DOB: 12/11/1950  Procedure: Left Heart Cath, Selective Coronary Angiography, SVG and LIMA angiography  Indication: 63 yo WM with history of remote CABG and prior stenting of the SVG to OM in 2010 presents with a NSTEMI. He is currently pain free.   Procedural Details: The left wrist was prepped, draped, and anesthetized with 1% lidocaine. Using the modified Seldinger technique, a 6 French slender sheath was introduced into the left radial artery. 3 mg of verapamil was administered through the sheath, weight-based unfractionated heparin was administered intravenously. Standard Judkins catheters were used for selective coronary angiography and graft angiography and left ventricular pressures. Catheter exchanges were performed over an exchange length guidewire. There were no immediate procedural complications. A TR band was used for radial hemostasis at the completion of the procedure.  The patient was transferred to the post catheterization recovery area for further monitoring.  Procedural Findings: Hemodynamics: AO 96/63 mean 78 mm Hg LV 103/24 mm Hg  Coronary angiography: Coronary dominance: right  Left mainstem: There is an eccentric 50-70% distal left main stenosis.   Left anterior descending (LAD): Occluded at the ostium.  Left circumflex (LCx): The LCx is a large vessel giving off a terminal PLOM without significant disease. The first OM is occluded at the origin.  Right coronary artery (RCA): 100% occlusion proximally.  The SVG to the RV marginal and PDA is patent. In the proximal graft there is mild disease up to 20%. After the RV marginal insertion there is a 95% focal stenosis in the SVG. This is followed by a 40-50% stenosis in the distal SVG.   The SVG to OM1 is occluded proximally within a previously stented segment.  The SVG to a large diagonal branch is patent.   The LIMA to the LAD is  patent. Following the graft insertion the native LAD has an eccentric 80-90% stenosis followed by a 70% stenosis distally.   Left ventriculography: Not done  Final Conclusions:   1. Severe 3 vessel obstructive CAD.  2. Patent LIMA to the LAD with severe disease in the native LAD distal to the graft insertion. 3. Patent SVG to the diagonal 4. Occluded SVG to the OM1. This appears to be the culprit lesion. 5. Patent SVG to RVM/PDA with severe graft disease 6. Elevated LVEDP.   Recommendations: Recommend hydration and following renal function closely. Check LV function by Echo. If renal function remains stable consider PCI of the SVG to PDA +/- native LAD distal to the IMA graft. I will review with interventional colleagues.   Peter SwazilandJordan, MDFACC  09/27/2013, 10:43 AM

## 2013-09-27 NOTE — H&P (Signed)
PCP:  [place]azilandJORDAN, Timoteo ExposeBETTY G, MD  Primary Cardiologist:  Armanda Magicraci Turner with [facility]  Chief Complaint: chest pain  HPI:  Francis BelliniJames R Brown is a 63 y.o. male , with a h/o MI, s/p CABGx5 and subsequent stenting, who presented to the Emergency Department at [place] complaining of constant L sided chest pain since 6 PM on June 6 15 2015. Patient reported that the chest pain started at the time when he received the fax message from his wife. He is in the process of divorce. And reported significant amount of stress related to this. Chest pain was 2/10 in intensity, felt as if somebody punched him in the chest and was dull. No associated diaphoresis, nausea, vomiting, shortness of breath Patient denied active symptoms of chest pain, shortness of breath, PND orthopnea, lower extremity edema, frequent or prolonged palpitations, lower extremity edema, nausea vomiting. Of note patient reported being outside on the boat a day prior to presentation and reported inadequate hydration.  [facility] he received 8 mg of IV morphine. At the time of presentation to the intensive care unit patient reported mild chest pain point 0.5/10 in intensity. Chest pain subsided completely after aggressive up titration of nitroglycerin. Unfortunately I also let to transient hypotension with blood pressure down to 60 mm mercury systolic. This lasted only for about 2 minutes and reversed after nitroglycerin infusion was put on hold. Patient is a 500 cc normal saline bolus, and nitroglycerin drip was restarted at 20 mics a minute after blood pressure was 100 mm mercury systolic.  Review of Systems:  12 systems reviewed and were negative except mentioned in history of present illness  Past Medical History: Past Medical History  Diagnosis Date  . Hypertension   . Coronary artery disease     a. 1996 s/p MI [place];  b. 1998 s/p MI in [place] CABGx5 in [place];  b. 2010 s/p stenting to the OM in [place] (Xience 4.0x12 DES, Xience 4.0x148mm DES)  . Erectile dysfunction   . Stroke     a. 2005;  b. 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  . Carotid disease, bilateral     a. 01/2013 Carotid U/S: RICA 1-39%, LICA 80-99%. Antegrade vertebral flow.  . Tobacco abuse   . Hyperlipidemia   . Bradycardia, drug induced   . CHF (congestive heart failure)   . Diabetes mellitus without complication 2015   Past Surgical History  Procedure Laterality Date  . Cardiac surgery    . Coronary stent placement    . Coronary artery bypass graft    . Coronary angioplasty    . Colonoscopy    . Endarterectomy Left 01/18/2013    Procedure: LEFT CAROTID ENDARTERECTOMY WITH PATCH ANGIOPLASTY;  Surgeon: Fransisco HertzBrian L Chen, MD;  [facility];  Service: Vascular;  Laterality: Left;  . Carotid endarterectomy Left 01-18-13    cea    Medications: Prior to Admission medications   Medication Sig Start Date End Date Taking? Authorizing Provider  aspirin EC 81 MG tablet Take 81 mg by mouth daily.    Historical Provider, MD  atorvastatin (LIPITOR) 40 MG tablet TAKE 1 TABLET BY MOUTH EVERY DAY 03/22/13   Quintella Reichertraci R Turner, MD  BAYER CONTOUR NEXT TEST test strip  07/13/13   Historical Provider, MD  BAYER MICROLET LANCETS lancets  07/13/13   Historical Provider, MD  buPROPion (WELLBUTRIN SR) 150 MG 12 hr tablet Take 150 mg by mouth  2 (two) times daily.  08/15/13   Historical Provider, MD  CIALIS 20 MG tablet  12/30/12   Historical Provider, MD  clopidogrel (PLAVIX) 75 MG tablet Take 1 tablet (75 mg total) by mouth daily. 01/13/13   Ok Anis, NP  diazepam (VALIUM) 2 MG tablet  01/14/13   Historical Provider, MD  FLUoxetine HCl (PROZAC PO) Take by mouth as needed.    Historical Provider, MD  hydrOXYzine (ATARAX/VISTARIL) 25 MG tablet  08/15/13   Historical Provider, MD  lisinopril (PRINIVIL,ZESTRIL) 10 MG tablet TAKE 1 TABLET ONCE A DAY ORALLY 04/15/13   Quintella Reichert, MD  oxyCODONE (ROXICODONE) 5  MG immediate release tablet Take 1 tablet (5 mg total) by mouth every 6 (six) hours as needed for pain. 01/18/13   Samantha J Rhyne, PA-C  Pyrithione Zinc (DERMAZINC SPRAY EX) Apply 1 application topically daily.    Historical Provider, MD  triamterene-hydrochlorothiazide (DYAZIDE) 37.5-25 MG per capsule TAKE 1 CAPSULE IN THE MORNING ONCE A DAY ORALLY 08/22/13   Quintella Reichert, MD  VIAGRA 100 MG tablet  07/08/13   Historical Provider, MD    Allergies:  No Known Allergies  Social History:  reports that he quit smoking about 10 years ago. His smoking use included Cigarettes. He has a 24 pack-year smoking history. He has never used smokeless tobacco. He reports that he drinks alcohol. He reports that he does not use illicit drugs.  Family History: Family History  Problem Relation Age of Onset  . CAD Father     died @ 54  . Heart disease Father     Before 52 years old  . Hyperlipidemia Father   . Heart attack Father   . Other Mother     died @ 33  . Heart disease Mother   . Hyperlipidemia Mother   . Varicose Veins Mother     PHYSICAL EXAM:  Filed Vitals:   09/26/13 2246 09/26/13 2300 09/26/13 2315 09/26/13 2330  BP:  145/94 138/94 143/88  Pulse: 92 91 94 97  Temp:      TempSrc:      Resp:      Height:      Weight:      SpO2: 99% 99% 99% 99%   General:  Well appearing. No respiratory difficulty HEENT: normal Neck: supple. no JVD. Carotids 2+ bilat; no bruits. Scar post left CEA. No lymphadenopathy or thryomegaly appreciated. Cor: PMI nondisplaced. Regular rate & rhythm. No rubs, gallops or murmurs. Lungs: Bibasilar crackles, no wheezings or rhonchi Abdomen: soft, nontender, nondistended. No hepatosplenomegaly. No bruits or masses. Good bowel sounds. Extremities: no cyanosis, clubbing, rash, edema. Decreased bilateral pedal pulses Neuro: alert & oriented x 3, cranial nerves grossly intact. moves all 4 extremities w/o difficulty. Affect pleasant.  skin: Dry axillas   Labs on  Admission:   Recent Labs  09/26/13 2127  NA 137  K 4.1  CL 97  CO2 26  GLUCOSE 135*  BUN 21  CREATININE 1.90*  CALCIUM 9.9   No results found for this basename: AST, ALT, ALKPHOS, BILITOT, PROT, ALBUMIN,  in the last 72 hours No results found for this basename: LIPASE, AMYLASE,  in the last 72 hours  Recent Labs  09/26/13 2127  WBC 11.0*  NEUTROABS 8.0*  HGB 16.0  HCT 45.1  MCV 96.0  PLT 191    Recent Labs  09/26/13 2127  TROPONINI 1.46*   No results found for this basename: TSH, T4TOTAL, FREET3, T3FREE, THYROIDAB,  in  the last 72 hours No results found for this basename: VITAMINB12, FOLATE, FERRITIN, TIBC, IRON, RETICCTPCT,  in the last 72 hours  Radiological Exams on Admission (all images were personally reviewed and interpreted by me, radiology reports were reviewed as well): Dg Chest 2 View  09/26/2013   CLINICAL DATA:  Left-sided chest pain.  EXAM: CHEST  2 VIEW  COMPARISON:  01/18/2013  FINDINGS: Sternotomy wires are unchanged. Lungs are hypoinflated without definite consolidation or effusion. Cardiomediastinal silhouette and remainder of the exam is unchanged.  IMPRESSION: No active cardiopulmonary disease.   Electronically Signed   By: Elberta Fortisaniel  Boyle M.D.   On: 09/26/2013 21:51   Last ECHO 01/12/2013:  Left ventricle: The cavity size was normal. Systolic function was normal. The estimated ejection fraction was in the range of 55% to 60%. Wall motion was normal; there were no regional wall motion abnormalities.  Left atrium: The atrium was mildly dilated.   EKG personally reviewed and interpreted by me:  sinus rhythm 75 beats per minute, left axis deviation, inferior Q waves, 1 mm ST segment depression in V2 and 0.5 mm ST segment depression in V3 - those are new compared to EKG from 2014  Assessment/Plan  NSTEMI -  present on admission  Currently chest pain-free on nitroglycerin drip  Continue heparin drip  Patient already on Plavix 75 mg daily, he received 600  mg Plavix by mouth at the ER per my request  Got full dose of aspirin in the ED outside. Continue 81 mg of aspirin daily N.p.o. for possible left heart cath in the morning if creatinine is better  Acute renal failure -  present on admission  Likely secondary to dehydration, patient reported poor hydration and being outside in the heat. Hold diuretics and lisinopril IV hydration with  lactated Ringer's 200 cc an hour for 2 L. Patient already received 500 cc normal saline bolus  Diabetes mellitus type 2  Sliding scale insulin for now  Check hemoglobin A1c  hypertension   hold lisinopril and triamteren because of to acute renal failure  Hyperlipidemia Check TSH, lipid profile in the morning Lipitor 80 mg daily Consider addition of ezetimibe  LISHMANOV, ANTON 09/27/2013, 1:26 AM

## 2013-09-27 NOTE — Progress Notes (Signed)
[facility]nd PAGE to [facility] call MD, still awaiting call back from MD

## 2013-09-27 NOTE — H&P (View-Only) (Signed)
Subjective: No CP or SOB  Objective: Vital signs in last 24 hours: Temp:  [97.7 F (36.5 C)-97.9 F (36.6 C)] 97.7 F (36.5 C) (06/16 0739) Pulse Rate:  [79-97] 79 (06/16 0600) Resp:  [12-18] 15 (06/16 0600) BP: (85-156)/(53-96) 113/70 mmHg (06/16 0600) SpO2:  [94 %-100 %] 98 % (06/16 0600) Weight:  [230 lb (104.327 kg)-230 lb 9.6 oz (104.6 kg)] 230 lb 9.6 oz (104.6 kg) (06/16 0100) Last BM Date: 09/26/13  Intake/Output from previous day: 06/15 0701 - 06/16 0700 In: 942.5 [I.V.:942.5] Out: 650 [Urine:650] Intake/Output this shift:    Medications Current Facility-Administered Medications  Medication Dose Route Frequency Provider Last Rate Last Dose  . aspirin EC tablet 81 mg  81 mg Oral Daily Nolon NationsAnton Lishmanov, MD      . atorvastatin (LIPITOR) tablet 80 mg  80 mg Oral q1800 Nolon NationsAnton Lishmanov, MD      . buPROPion [facility](WELLBUTRIN SR) 12 hr tablet 150 mg  150 mg Oral BID WC Nolon NationsAnton Lishmanov, MD      . clopidogrel (PLAVIX) tablet 75 mg  75 mg Oral Q breakfast Nolon NationsAnton Lishmanov, MD      . diazepam (VALIUM) tablet 2.5 mg  2.5 mg Oral Q6H PRN Lars MassonKatarina H Nelson, MD      . heparin ADULT infusion 100 units/mL (25000 units/250 mL)  1,500 Units/hr Intravenous Continuous Shon Batonourtney F Horton, MD 15 mL/hr at 09/27/13 0030 1,500 Units/hr at 09/27/13 0030  . insulin aspart (novoLOG) injection 0-5 Units  0-5 Units Subcutaneous QHS Nolon NationsAnton Lishmanov, MD      . insulin aspart (novoLOG) injection 0-9 Units  0-9 Units Subcutaneous TID WC Nolon NationsAnton Lishmanov, MD      . lactated ringers infusion   Intravenous Continuous Nolon NationsAnton Lishmanov, MD 200 mL/hr at 09/27/13 0630    . metoprolol tartrate (LOPRESSOR) tablet 12.5 mg  12.5 mg Oral BID Nolon NationsAnton Lishmanov, MD      . nitroGLYCERIN (NITROSTAT) SL tablet 0.4 mg  0.4 mg Sublingual Q5 Min x 3 PRN Nolon NationsAnton Lishmanov, MD      . nitroGLYCERIN 0.2 mg/mL in dextrose 5 % infusion  2-200 mcg/min Intravenous Titrated Nolon NationsAnton Lishmanov, MD 6 mL/hr at 09/27/13 0600 20 mcg/min at 09/27/13  0600  . oxyCODONE (Oxy IR/ROXICODONE) immediate release tablet 5 mg  5 mg Oral Q6H PRN Nolon NationsAnton Lishmanov, MD        PE: General appearance: alert, cooperative and no distress Lungs: clear to auscultation bilaterally Heart: regular rate and rhythm, S1, S2 normal, no murmur, click, rub or gallop Extremities: Trace LEE Pulses: 2+ and symmetric Skin: Warm and dry Neurologic: Grossly normal  Lab Results:   Recent Labs  09/26/13 2127 09/27/13 0518  WBC 11.0* 12.9*  HGB 16.0 13.6  HCT 45.1 39.9  PLT 191 182   BMET  Recent Labs  09/26/13 2127 09/27/13 0518  NA 137 138  K 4.1 4.3  CL 97 99  CO2 26 26  GLUCOSE 135* 164*  BUN 21 20  CREATININE 1.90* 1.78*  CALCIUM 9.9 9.0   PT/INR  Recent Labs  09/26/13 2127 09/27/13 0518  LABPROT 13.5 14.0  INR 1.05 1.10   Cholesterol  Recent Labs  09/27/13 0518  CHOL 122   Lipid Panel     Component Value Date/Time   CHOL 122 09/27/2013 0518   TRIG 119 09/27/2013 0518   HDL 33* 09/27/2013 0518   CHOLHDL 3.7 09/27/2013 0518   VLDL 24 09/27/2013 0518   LDLCALC 65 09/27/2013 0518  Cardiac Panel (last 3 results)  Recent Labs  09/26/13 2127 09/27/13 0518  TROPONINI 1.46* 11.86*    Assessment/Plan Francis Brown is a 63 y.o. male , with a h/o MI, s/p CABGx5 and subsequent stenting, who presented to the Emergency Department at [place] complaining of constant L sided chest pain since 6 PM on June 6 15 2015. Patient reported that the chest pain started at the time when he received the fax message from his wife. He is in the process of divorce. And reported significant amount of stress related to this. Chest pain was 2/10 in intensity, felt as if somebody punched him in the chest and was dull. No associated diaphoresis, nausea, vomiting, shortness of breath  Patient denied active symptoms of chest pain, shortness of breath, PND orthopnea, lower extremity edema, frequent or prolonged palpitations, lower extremity edema, nausea  vomiting.   Of note patient reported being outside on the boat a day prior to presentation and reported inadequate hydration.  In route to [place] [facility] he received 8 mg of IV morphine. At the time of presentation to the intensive care unit patient reported mild chest pain point 0.5/10 in intensity. Chest pain subsided completely after aggressive up titration of nitroglycerin. Unfortunately I also let to transient hypotension with blood pressure down to 60 mm mercury systolic. This lasted only for about 2 minutes and reversed after nitroglycerin infusion was put on hold. Patient is a 500 cc normal saline bolus, and nitroglycerin drip was restarted at 20 mics a minute after blood pressure was 100 mm mercury systolic.  Principal Problem:   NSTEMI (non-ST elevated myocardial infarction) Active Problems:   Coronary artery disease   Hypertension   Hyperlipidemia   CABGx5 1998  Plan:    Pain free.  Troponin 11.86 so far.  On IV heparin, IV NTG plavix, lopressor 12.5 mg BID.   WBCs mildly elevated.  No fever.  SCr 1.78.  Left heart cath today.   Increase lopressor to 25 bid.    LOS: 1 day    HAGER, BRYAN PA-C 09/27/2013 8:17 AM  Still having mild pressure this am  Under a lot of stress at work and with divorce.  Significant increase in troponin this am  No acute ST elevation  Have called cath lab to expedite cath next.  Give lopressor now and continue heparin  Risks discussed willing to proceed orders written  Given grafts will likely be from femoral approach  Primary cardiologist is Dr Turner  [name]   

## 2013-09-27 NOTE — Progress Notes (Signed)
Subjective: No CP or SOB  Objective: Vital signs in last 24 hours: Temp:  [97.7 F (36.5 C)-97.9 F (36.6 C)] 97.7 F (36.5 C) (06/16 0739) Pulse Rate:  [79-97] 79 (06/16 0600) Resp:  [12-18] 15 (06/16 0600) BP: (85-156)/(53-96) 113/70 mmHg (06/16 0600) SpO2:  [94 %-100 %] 98 % (06/16 0600) Weight:  [230 lb (104.327 kg)-230 lb 9.6 oz (104.6 kg)] 230 lb 9.6 oz (104.6 kg) (06/16 0100) Last BM Date: 09/26/13  Intake/Output from previous day: 06/15 0701 - 06/16 0700 In: 942.5 [I.V.:942.5] Out: 650 [Urine:650] Intake/Output this shift:    Medications Current Facility-Administered Medications  Medication Dose Route Frequency Patrick Salemi Last Rate Last Dose  . aspirin EC tablet 81 mg  81 mg Oral Daily Nolon NationsAnton Lishmanov, MD      . atorvastatin (LIPITOR) tablet 80 mg  80 mg Oral q1800 Nolon NationsAnton Lishmanov, MD      . buPROPion [facility](WELLBUTRIN SR) 12 hr tablet 150 mg  150 mg Oral BID WC Nolon NationsAnton Lishmanov, MD      . clopidogrel (PLAVIX) tablet 75 mg  75 mg Oral Q breakfast Nolon NationsAnton Lishmanov, MD      . diazepam (VALIUM) tablet 2.5 mg  2.5 mg Oral Q6H PRN Lars MassonKatarina H Nelson, MD      . heparin ADULT infusion 100 units/mL (25000 units/250 mL)  1,500 Units/hr Intravenous Continuous Shon Batonourtney F Horton, MD 15 mL/hr at 09/27/13 0030 1,500 Units/hr at 09/27/13 0030  . insulin aspart (novoLOG) injection 0-5 Units  0-5 Units Subcutaneous QHS Nolon NationsAnton Lishmanov, MD      . insulin aspart (novoLOG) injection 0-9 Units  0-9 Units Subcutaneous TID WC Nolon NationsAnton Lishmanov, MD      . lactated ringers infusion   Intravenous Continuous Nolon NationsAnton Lishmanov, MD 200 mL/hr at 09/27/13 0630    . metoprolol tartrate (LOPRESSOR) tablet 12.5 mg  12.5 mg Oral BID Nolon NationsAnton Lishmanov, MD      . nitroGLYCERIN (NITROSTAT) SL tablet 0.4 mg  0.4 mg Sublingual Q5 Min x 3 PRN Nolon NationsAnton Lishmanov, MD      . nitroGLYCERIN 0.2 mg/mL in dextrose 5 % infusion  2-200 mcg/min Intravenous Titrated Nolon NationsAnton Lishmanov, MD 6 mL/hr at 09/27/13 0600 20 mcg/min at 09/27/13  0600  . oxyCODONE (Oxy IR/ROXICODONE) immediate release tablet 5 mg  5 mg Oral Q6H PRN Nolon NationsAnton Lishmanov, MD        PE: General appearance: alert, cooperative and no distress Lungs: clear to auscultation bilaterally Heart: regular rate and rhythm, S1, S2 normal, no murmur, click, rub or gallop Extremities: Trace LEE Pulses: 2+ and symmetric Skin: Warm and dry Neurologic: Grossly normal  Lab Results:   Recent Labs  09/26/13 2127 09/27/13 0518  WBC 11.0* 12.9*  HGB 16.0 13.6  HCT 45.1 39.9  PLT 191 182   BMET  Recent Labs  09/26/13 2127 09/27/13 0518  NA 137 138  K 4.1 4.3  CL 97 99  CO2 26 26  GLUCOSE 135* 164*  BUN 21 20  CREATININE 1.90* 1.78*  CALCIUM 9.9 9.0   PT/INR  Recent Labs  09/26/13 2127 09/27/13 0518  LABPROT 13.5 14.0  INR 1.05 1.10   Cholesterol  Recent Labs  09/27/13 0518  CHOL 122   Lipid Panel     Component Value Date/Time   CHOL 122 09/27/2013 0518   TRIG 119 09/27/2013 0518   HDL 33* 09/27/2013 0518   CHOLHDL 3.7 09/27/2013 0518   VLDL 24 09/27/2013 0518   LDLCALC 65 09/27/2013 0518  Cardiac Panel (last 3 results)  Recent Labs  09/26/13 2127 09/27/13 0518  TROPONINI 1.46* 11.86*    Assessment/Plan Francis Brown is a 63 y.o. male , with a h/o MI, s/p CABGx5 and subsequent stenting, who presented to the Emergency Department at [place] complaining of constant L sided chest pain since 6 PM on June 6 15 2015. Patient reported that the chest pain started at the time when he received the fax message from his wife. He is in the process of divorce. And reported significant amount of stress related to this. Chest pain was 2/10 in intensity, felt as if somebody punched him in the chest and was dull. No associated diaphoresis, nausea, vomiting, shortness of breath  Patient denied active symptoms of chest pain, shortness of breath, PND orthopnea, lower extremity edema, frequent or prolonged palpitations, lower extremity edema, nausea  vomiting.   Of note patient reported being outside on the boat a day prior to presentation and reported inadequate hydration.  [facility] he received 8 mg of IV morphine. At the time of presentation to the intensive care unit patient reported mild chest pain point 0.5/10 in intensity. Chest pain subsided completely after aggressive up titration of nitroglycerin. Unfortunately I also let to transient hypotension with blood pressure down to 60 mm mercury systolic. This lasted only for about 2 minutes and reversed after nitroglycerin infusion was put on hold. Patient is a 500 cc normal saline bolus, and nitroglycerin drip was restarted at 20 mics a minute after blood pressure was 100 mm mercury systolic.  Principal Problem:   NSTEMI (non-ST elevated myocardial infarction) Active Problems:   Coronary artery disease   Hypertension   Hyperlipidemia   CABGx5 1998  Plan:    Pain free.  Troponin 11.86 so far.  On IV heparin, IV NTG plavix, lopressor 12.5 mg BID.   WBCs mildly elevated.  No fever.  SCr 1.78.  Left heart cath today.   Increase lopressor to 25 bid.    LOS: 1 day    HAGER, BRYAN PA-C 09/27/2013 8:17 AM  Still having mild pressure this am  Under a lot of stress at work and with divorce.  Significant increase in troponin this am  No acute ST elevation  Have called cath lab to expedite cath next.  Give lopressor now and continue heparin  Risks discussed willing to proceed orders written  Given grafts will likely be from femoral approach  Primary cardiologist is Dr Elvia Collumurner  Peter Nishan

## 2013-09-27 NOTE — Progress Notes (Signed)
Utilization Review Completed.  

## 2013-09-27 NOTE — Progress Notes (Signed)
CRITICAL VALUE ALERT  Critical value received: Troponin 11.86  Date of notification: 09/27/13  Time of notification: 0600  Critical value read back: yes  Nurse who received alert: Verl BlalockHans Johnson, RN informed pt's nurse Alfonso EllisJessie Yoneko Talerico, RN  MD notified (1st page): [facility]   Time of first page: [phone]  MD notified (2nd page): [facility]  Time of second page: [phone]  Responding MD: Still waiting for a response

## 2013-09-27 NOTE — Progress Notes (Signed)
ANTICOAGULATION CONSULT NOTE - Follow-Up Consult  Pharmacy Consult for Heparin  Indication: chest pain/ACS  No Known Allergies  Patient Measurements: Height: 6' (182.9 cm) Weight: 230 lb 9.6 oz (104.6 kg) IBW/kg (Calculated) : 77.6 Heparin Dosing Weight: 98 kg  Vital Signs: Temp: 97.7 F (36.5 C) (06/16 0739) Temp src: Oral (06/16 0739) BP: 113/70 mmHg (06/16 0600) Pulse Rate: 79 (06/16 0600)  Labs:  Recent Labs  09/26/13 2127 09/27/13 0518  HGB 16.0 13.6  HCT 45.1 39.9  PLT 191 182  APTT 27  --   LABPROT 13.5 14.0  INR 1.05 1.10  HEPARINUNFRC  --  0.23*  CREATININE 1.90* 1.78*  TROPONINI 1.46* 11.86*    Estimated Creatinine Clearance: 54.5 ml/min (by C-G formula based on Cr of 1.78).   Medical History: Past Medical History  Diagnosis Date  . Hypertension   . Coronary artery disease     a. 1996 s/p MI [place];  b. [place] CABGx[place] in [place];  b. 2010 s/p stenting to the OM in [place] (Xience 4.0x12 DES, Xience 4.0x838mm DES)  . Erectile dysfunction   . Stroke     a. 2005;  b. 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  . Carotid disease, bilateral     a. 01/2013 Carotid U/S: RICA 1-39%, LICA 80-99%. Antegrade vertebral flow.  . Tobacco abuse   . Hyperlipidemia   . Bradycardia, drug induced   . CHF (congestive heart failure)   . Diabetes mellitus without complication 2015    Medications:  Prescriptions prior to admission  Medication Sig Dispense Refill  . aspirin EC 81 MG tablet Take 81 mg by mouth daily.      [name]. atorvastatin (LIPITOR) 40 MG tablet TAKE 1 TABLET BY MOUTH EVERY DAY  30 tablet  5  . BAYER CONTOUR NEXT TEST test strip       . BAYER MICROLET LANCETS lancets       . buPROPion (WELLBUTRIN SR) 150 MG 12 hr tablet Take 150 mg by mouth 2 (two) times daily.       [name]. CIALIS 20 MG tablet       . clopidogrel (PLAVIX) 75 MG tablet Take 1 tablet (75 mg total) by mouth daily.  30 tablet  6  .  diazepam (VALIUM) 2 MG tablet       . FLUoxetine HCl (PROZAC PO) Take by mouth as needed.      . hydrOXYzine (ATARAX/VISTARIL) 25 MG tablet       . lisinopril (PRINIVIL,ZESTRIL) 10 MG tablet TAKE 1 TABLET ONCE A DAY ORALLY  90 tablet  1  . oxyCODONE (ROXICODONE) 5 MG immediate release tablet Take 1 tablet (5 mg total) by mouth every 6 (six) hours as needed for pain.  30 tablet  0  . Pyrithione Zinc (DERMAZINC SPRAY EX) Apply 1 application topically daily.      [name]. triamterene-hydrochlorothiazide (DYAZIDE) 37.5-25 MG per capsule TAKE 1 CAPSULE IN THE MORNING ONCE A DAY ORALLY  90 capsule  0  . VIAGRA 100 MG tablet         Assessment: 63 yo M admitted 09/26/2013 with CP and a positive troponin.  Pharmacy consulted to dose heparin.  PMH: HTN, HLD, CAD  Heparin level this morning just below goal at 0.23.  No bleeding or complications noted, CBC and platelet count stable.  Awaiting LHC today.  Goal of Therapy:  Heparin level 0.3-0.7 units/ml Monitor platelets by anticoagulation protocol:  Yes   Plan:  1. Increase IV heparin to 1700 units/hr. 2. Recheck heparin level in 6 hrs. 3. F/u plans for anticoagulation after cath lab today. 4. Continue daily heparin level and CBC.  Tad MooreJessica Carney, Pharm D, BCPS  Clinical Pharmacist Pager [phone]  09/27/2013 8:56 AM

## 2013-09-27 NOTE — Interval H&P Note (Signed)
History and Physical Interval Note:  09/27/2013 9:59 AM  Francis Brown  has presented today for surgery, with the diagnosis of urgent non stemi  The various methods of treatment have been discussed with the patient and family. After consideration of risks, benefits and other options for treatment, the patient has consented to  Procedure(s): LEFT HEART CATHETERIZATION WITH CORONARY ANGIOGRAM (N/A) as a surgical intervention .  The patient's history has been reviewed, patient examined, no change in status, stable for surgery.  I have reviewed the patient's chart and labs.  Questions were answered to the patient's satisfaction.   Cath Lab Visit (complete for each Cath Lab visit)  Clinical Evaluation Leading to the Procedure:   ACS: yes  Non-ACS:    Anginal Classification: CCS IV  Anti-ischemic medical therapy: Minimal Therapy (1 class of medications)  Non-Invasive Test Results: No non-invasive testing performed  Prior CABG: Previous CABG        Theron AristaPeter Marlboro Park HospitalJordanMD,FACC 09/27/2013 10:00 AM

## 2013-09-27 NOTE — Progress Notes (Signed)
Paged AM Cardiologist on about critical troponin of 11.86, night RN paged MD twice.  Patient is also getting LR 200cc/hr and his Cr is elevated, need orders to clarify fluids aswell.  Paging service did call back get information about patient, awaitng MD to return page

## 2013-09-27 NOTE — Progress Notes (Signed)
ANTICOAGULATION CONSULT NOTE - Follow-Up Consult  Pharmacy Consult for Heparin - asked to resume post-cath - 8 hrs after sheath removed Indication: chest pain/ACS  No Known Allergies  Patient Measurements: Height: 6' (182.9 cm) Weight: 230 lb 9.6 oz (104.6 kg) IBW/kg (Calculated) : 77.6 Heparin Dosing Weight: 98 kg  Vital Signs: Temp: 97.7 F (36.5 C) (06/16 0739) Temp src: Oral (06/16 0739) BP: 113/70 mmHg (06/16 0600) Pulse Rate: 63 (06/16 1100)  Labs:  Recent Labs  09/26/13 2127 09/27/13 0518  HGB 16.0 13.6  HCT 45.1 39.9  PLT 191 182  APTT 27  --   LABPROT 13.5 14.0  INR 1.05 1.10  HEPARINUNFRC  --  0.23*  CREATININE 1.90* 1.78*  TROPONINI 1.46* 11.86*    Estimated Creatinine Clearance: 54.5 ml/min (by C-G formula based on Cr of 1.78).   Medical History: Past Medical History  Diagnosis Date  . Hypertension   . Coronary artery disease     a. 1996 s/p MI [place];  b. 1998 s/p MI in [place] with CABGx5 in [place];  b. 2010 s/p stenting to the OM in [place], [place] (Xience 4.0x12 DES, Xience 4.0x258mm DES)  . Erectile dysfunction   . Stroke     a. 2005;  b. 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  . Carotid disease, bilateral     a. 01/2013 Carotid U/S: RICA 1-39%, LICA 80-99%. Antegrade vertebral flow.  . Tobacco abuse   . Hyperlipidemia   . Bradycardia, drug induced   . CHF (congestive heart failure)   . Diabetes mellitus without complication 2015    Medications:  Prescriptions prior to admission  Medication Sig Dispense Refill  . aspirin EC 81 MG tablet Take 81 mg by mouth daily.      [name]. atorvastatin (LIPITOR) 40 MG tablet TAKE 1 TABLET BY MOUTH EVERY DAY  30 tablet  5  . BAYER CONTOUR NEXT TEST test strip       . BAYER MICROLET LANCETS lancets       . buPROPion (WELLBUTRIN SR) 150 MG 12 hr tablet Take 150 mg by mouth 2 (two) times daily.       [name]. CIALIS 20 MG tablet       . clopidogrel (PLAVIX) 75 MG tablet Take 1  tablet (75 mg total) by mouth daily.  30 tablet  6  . diazepam (VALIUM) 2 MG tablet       . FLUoxetine HCl (PROZAC PO) Take by mouth as needed.      . hydrOXYzine (ATARAX/VISTARIL) 25 MG tablet       . lisinopril (PRINIVIL,ZESTRIL) 10 MG tablet TAKE 1 TABLET ONCE A DAY ORALLY  90 tablet  1  . oxyCODONE (ROXICODONE) 5 MG immediate release tablet Take 1 tablet (5 mg total) by mouth every 6 (six) hours as needed for pain.  30 tablet  0  . Pyrithione Zinc (DERMAZINC SPRAY EX) Apply 1 application topically daily.      [name]. triamterene-hydrochlorothiazide (DYAZIDE) 37.5-25 MG per capsule TAKE 1 CAPSULE IN THE MORNING ONCE A DAY ORALLY  90 capsule  0  . VIAGRA 100 MG tablet         Assessment: 63 yo M admitted 09/26/2013 with CP and a positive troponin.  Pharmacy consulted to dose heparin.  PMH: HTN, HLD, CAD  Heparin level this morning just below goal at 0.23.  No bleeding or complications noted, CBC and platelet count stable.  Heparin rate was increased to 1700 units/hr,  but then heparin was turned off for cath lab before a level was due.  Pharmacy asked to resume IV heparin 8 hrs after sheath out until decision is made about further PCI.  Sheath was removed at 1038 AM.  Goal of Therapy:  Heparin level 0.3-0.7 units/ml Monitor platelets by anticoagulation protocol: Yes   Plan:  1. Resume IV heparin at 1700 units/hr at 1845 PM. 2. Check heparin level 6 hrs after gtt restarts. 3. F/u plans for return to cath lab. 4. Continue daily heparin level and CBC.  Tad MooreJessica Carney, Pharm D, BCPS  Clinical Pharmacist Pager [phone]  09/27/2013 11:07 AM

## 2013-09-27 NOTE — Progress Notes (Signed)
2D Echocardiogram Complete.  09/27/2013   Bethany McMahill, RDCS 

## 2013-09-28 LAB — CBC
HEMATOCRIT: 40.9 % (ref 39.0–52.0)
HEMOGLOBIN: 13.9 g/dL (ref 13.0–17.0)
MCH: 32.3 pg (ref 26.0–34.0)
MCHC: 34 g/dL (ref 30.0–36.0)
MCV: 94.9 fL (ref 78.0–100.0)
Platelets: 163 10*3/uL (ref 150–400)
RBC: 4.31 MIL/uL (ref 4.22–5.81)
RDW: 13.8 % (ref 11.5–15.5)
WBC: 15.4 10*3/uL — ABNORMAL HIGH (ref 4.0–10.5)

## 2013-09-28 LAB — BASIC METABOLIC PANEL
BUN: 13 mg/dL (ref 6–23)
CALCIUM: 8.6 mg/dL (ref 8.4–10.5)
CHLORIDE: 98 meq/L (ref 96–112)
CO2: 23 mEq/L (ref 19–32)
Creatinine, Ser: 1.2 mg/dL (ref 0.50–1.35)
GFR calc non Af Amer: 64 mL/min — ABNORMAL LOW (ref >90)
GFR, EST AFRICAN AMERICAN: 74 mL/min — AB (ref >90)
Glucose, Bld: 152 mg/dL — ABNORMAL HIGH (ref 70–99)
Potassium: 3.9 mEq/L (ref 3.7–5.3)
SODIUM: 135 meq/L — AB (ref 137–147)

## 2013-09-28 LAB — GLUCOSE, CAPILLARY
Glucose-Capillary: 162 mg/dL — ABNORMAL HIGH (ref 70–99)
Glucose-Capillary: 151 mg/dL — ABNORMAL HIGH (ref 70–99)
Glucose-Capillary: 116 mg/dL — ABNORMAL HIGH (ref 70–99)

## 2013-09-28 LAB — HEPARIN LEVEL (UNFRACTIONATED)
Heparin Unfractionated: 0.29 IU/mL — ABNORMAL LOW (ref 0.30–0.70)
Heparin Unfractionated: 0.53 IU/mL (ref 0.30–0.70)

## 2013-09-28 MED ORDER — SODIUM CHLORIDE 0.9 % IJ SOLN: 3.0000 mL | Freq: Two times a day (BID) | INTRAMUSCULAR | Status: DC

## 2013-09-28 MED ORDER — SODIUM CHLORIDE 0.9 % IV SOLN: INTRAVENOUS | Status: DC

## 2013-09-28 MED ORDER — SODIUM CHLORIDE 0.9 % IV SOLN: 250.0000 mL | INTRAVENOUS | Status: DC | PRN

## 2013-09-28 MED ORDER — SODIUM CHLORIDE 0.9 % IJ SOLN: 3.0000 mL | INTRAMUSCULAR | Status: DC | PRN

## 2013-09-28 MED ORDER — ASPIRIN EC 81 MG PO TBEC
81.0000 mg | DELAYED_RELEASE_TABLET | Freq: Every day | ORAL | Status: DC
  Administered 2013-09-30: 81 mg via ORAL
  Filled 2013-09-28: qty 1

## 2013-09-28 MED ORDER — ASPIRIN 81 MG PO CHEW
81.0000 mg | CHEWABLE_TABLET | ORAL | Status: AC
  Administered 2013-09-29: 81 mg via ORAL
  Filled 2013-09-28: qty 1

## 2013-09-28 NOTE — Progress Notes (Signed)
ANTICOAGULATION CONSULT NOTE - Follow Up Consult  Pharmacy Consult for Heparin Indication: chest pain/ACS  No Known Allergies  Patient Measurements: Height: 6' (182.9 cm) Weight: 233 lb 4.8 oz (105.824 kg) IBW/kg (Calculated) : 77.6 Heparin Dosing Weight: 98kg  Vital Signs: Temp: 98.6 F (37 C) (06/17 1157) Temp src: Oral (06/17 1157) BP: 100/65 mmHg (06/17 1200) Pulse Rate: 67 (06/17 1200)  Labs:  Recent Labs  09/26/13 2127 09/27/13 0518 09/27/13 1420 09/28/13 0246 09/28/13 1315  HGB 16.0 13.6  --  13.9  --   HCT 45.1 39.9  --  40.9  --   PLT 191 182  --  163  --   APTT 27  --   --   --   --   LABPROT 13.5 14.0  --   --   --   INR 1.05 1.10  --   --   --   HEPARINUNFRC  --  0.23*  --  0.29* 0.53  CREATININE 1.90* 1.78*  --  1.20  --   TROPONINI 1.46* 11.86* >20.00*  --   --     Estimated Creatinine Clearance: 81.3 ml/min (by C-G formula based on Cr of 1.2).   Medications:  Heparin 1850 units/hr  Assessment: 63yom continues on heparin for ACS s/p cath. Noted plans for PCI tomorrow AM. Heparin level (0.53) is now therapeutic after rate increase - will continue current heparin rate and follow-up AM heparin level or post cath orders. - H/H and Plts wnl - No significant bleeding reported  Goal of Therapy:  Heparin level 0.3-0.7 units/ml Monitor platelets by anticoagulation protocol: Yes   Plan:  1. Continue heparin 1850 ut/hr (18.5 ml/hr) 2. Follow-up AM heparin level or post cath orders   Cleon DewDulaney, [place] Robert [phone] 09/28/2013,2:00 PM

## 2013-09-28 NOTE — Progress Notes (Signed)
Patient ID: Francis Brown, male   DOB: 11/06/1950, 63 y.o.   MRN: [id number]    Subjective: No CP or SOB  Objective: Vital signs in last 24 hours: Temp:  [97.3 F (36.3 C)-99 F (37.2 C)] 99 F (37.2 C) (06/17 0741) Pulse Rate:  [62-89] 73 (06/17 0700) Resp:  [11-25] 16 (06/17 0700) BP: (86-137)/(35-91) 101/65 mmHg (06/17 0600) SpO2:  [91 %-99 %] 96 % (06/17 0700) Weight:  [233 lb 4.8 oz (105.824 kg)] 233 lb 4.8 oz (105.824 kg) (06/17 0500) Last BM Date: 09/26/13  Intake/Output from previous day: 06/16 0701 - 06/17 0700 In: 1667.1 [I.V.:1667.1] Out: 4325 [Urine:4325] Intake/Output this shift:    Medications Current Facility-Administered Medications  Medication Dose Route Frequency Provider Last Rate Last Dose  . aspirin EC tablet 81 mg  81 mg Oral Daily Nolon NationsAnton Lishmanov, MD   81 mg at 09/27/13 1233  . atorvastatin (LIPITOR) tablet 80 mg  80 mg Oral q1800 Nolon NationsAnton Lishmanov, MD   80 mg at 09/27/13 1811  . buPROPion [facility](WELLBUTRIN SR) 12 hr tablet 150 mg  150 mg Oral BID WC Nolon NationsAnton Lishmanov, MD   150 mg at 09/27/13 1811  . clopidogrel (PLAVIX) tablet 75 mg  75 mg Oral Q breakfast Nolon NationsAnton Lishmanov, MD   75 mg at 09/27/13 1233  . diazepam (VALIUM) tablet 2.5 mg  2.5 mg Oral Q6H PRN Lars MassonKatarina H Nelson, MD      . heparin ADULT infusion 100 units/mL (25000 units/250 mL)  1,850 Units/hr Intravenous Continuous Abran DukeJames Ledford, RPH 18.5 mL/hr at 09/28/13 0700 1,850 Units/hr at 09/28/13 0700  . insulin aspart (novoLOG) injection 0-5 Units  0-5 Units Subcutaneous QHS Nolon NationsAnton Lishmanov, MD      . insulin aspart (novoLOG) injection 0-9 Units  0-9 Units Subcutaneous TID WC Nolon NationsAnton Lishmanov, MD   1 Units at 09/27/13 1234  . metoprolol tartrate (LOPRESSOR) tablet 25 mg  25 mg Oral BID Wilburt FinlayBryan Hager, PA-C   25 mg at 09/27/13 2217  . nitroGLYCERIN (NITROSTAT) SL tablet 0.4 mg  0.4 mg Sublingual Q5 Min x 3 PRN Nolon NationsAnton Lishmanov, MD      . oxyCODONE (Oxy IR/ROXICODONE) immediate release tablet 5 mg  5 mg Oral Q6H PRN  Nolon NationsAnton Lishmanov, MD        PE: General appearance: alert, cooperative and no distress Lungs: clear to auscultation bilaterally Heart: regular rate and rhythm, S1, S2 normal, no murmur, click, rub or gallop Extremities: Trace LEE Pulses: 2+ and symmetric Skin: Warm and dry Neurologic: Grossly normal Left radial cath site A with good pulse   Lab Results:   Recent Labs  09/26/13 2127 09/27/13 0518 09/28/13 0246  WBC 11.0* 12.9* 15.4*  HGB 16.0 13.6 13.9  HCT 45.1 39.9 40.9  PLT 191 182 163   BMET  Recent Labs  09/26/13 2127 09/27/13 0518 09/28/13 0246  NA 137 138 135*  K 4.1 4.3 3.9  CL 97 99 98  CO2 26 26 23   GLUCOSE 135* 164* 152*  BUN 21 20 13   CREATININE 1.90* 1.78* 1.20  CALCIUM 9.9 9.0 8.6   PT/INR  Recent Labs  09/26/13 2127 09/27/13 0518  LABPROT 13.5 14.0  INR 1.05 1.10   Cholesterol  Recent Labs  09/27/13 0518  CHOL 122   Lipid Panel     Component Value Date/Time   CHOL 122 09/27/2013 0518   TRIG 119 09/27/2013 0518   HDL 33* 09/27/2013 0518   CHOLHDL 3.7 09/27/2013 0518   VLDL 24 09/27/2013 0518  LDLCALC 65 09/27/2013 0518     Cardiac Panel (last 3 results)  Recent Labs  09/26/13 2127 09/27/13 0518 09/27/13 1420  TROPONINI 1.46* 11.86* >20.00*    Assessment/Plan Francis Brown is a 63 y.o. male , with a h/o MI, s/p CABGx5 and subsequent stenting, who presented to the Emergency Department at [place] complaining of constant L sided chest pain since 6 PM on June 6 15 2015. Patient reported that the chest pain started at the time when he received the fax message from his wife. He is in the process of divorce. And reported significant amount of stress related to this. Chest pain was 2/10 in intensity, felt as if somebody punched him in the chest and was dull. No associated diaphoresis, nausea, vomiting, shortness of breath  Patient denied active symptoms of chest pain, shortness of breath, PND orthopnea, lower extremity edema, frequent  or prolonged palpitations, lower extremity edema, nausea vomiting.   Of note patient reported being outside on the boat a day prior to presentation and reported inadequate hydration.  In route to [facility] he received 8 mg of IV morphine. At the time of presentation to the intensive care unit patient reported mild chest pain point 0.5/10 in intensity. Chest pain subsided completely after aggressive up titration of nitroglycerin. Unfortunately I also let to transient hypotension with blood pressure down to 60 mm mercury systolic. This lasted only for about 2 minutes and reversed after nitroglycerin infusion was put on hold. Patient is a 500 cc normal saline bolus, and nitroglycerin drip was restarted at 20 mics a minute after blood pressure was 100 mm mercury systolic.  Principal Problem:   NSTEMI (non-ST elevated myocardial infarction) Active Problems:   Coronary artery disease   Hypertension   Hyperlipidemia   CABGx5 1998  Plan:  CAD:  Culprit infarct was SVG to OM  EF 45-50% by echo with no mechanical complication / MR  Continue ASA/Plavix and heparin  Beta blocker No more fluid as echo and cath with high filling pressures and Cr improved  For PCI SVG RCA with Dr Jordan in am HTN:  Consider low dose ACE post PCI if needed  

## 2013-09-28 NOTE — Progress Notes (Signed)
ANTICOAGULATION CONSULT NOTE - Follow Up Consult  Pharmacy Consult for Heparin  Indication: chest pain/ACS, s/p cath  No Known Allergies  Patient Measurements: Height: 6' (182.9 cm) Weight: 230 lb 9.6 oz (104.6 kg) IBW/kg (Calculated) : 77.6  Vital Signs: Temp: 98.7 F (37.1 C) (06/16 1959) Temp src: Oral (06/16 1959) BP: 110/71 mmHg (06/17 0300) Pulse Rate: 73 (06/17 0300)  Labs:  Recent Labs  09/26/13 2127 09/27/13 0518 09/27/13 1420 09/28/13 0246  HGB 16.0 13.6  --  13.9  HCT 45.1 39.9  --  40.9  PLT 191 182  --  163  APTT 27  --   --   --   LABPROT 13.5 14.0  --   --   INR 1.05 1.10  --   --   HEPARINUNFRC  --  0.23*  --  0.29*  CREATININE 1.90* 1.78*  --   --   TROPONINI 1.46* 11.86* >20.00*  --     Estimated Creatinine Clearance: 54.5 ml/min (by C-G formula based on Cr of 1.78).   Medications:  Heparin 1700 units/hr  Assessment: 63 y/o M on heparin s/p cath, HL after re-start is 0.29, other labs as above   Goal of Therapy:  Heparin level 0.3-0.7 units/ml Monitor platelets by anticoagulation protocol: Yes   Plan:  -Increase heparin to 1850 units/hr -1100  -Daily CBC/HL -Monitor for bleeding  -F/U cardiology plans  Abran DukeLedford, [place] 09/28/2013,3:40 AM

## 2013-09-29 ENCOUNTER — Encounter (HOSPITAL_COMMUNITY)
Admission: EM | Disposition: A | Discharge status: Home / Self Care | Payer: BC Managed Care – PPO | Source: Home / Self Care | Attending: Cardiology

## 2013-09-29 DIAGNOSIS — I2510 Atherosclerotic heart disease of native coronary artery without angina pectoris: Secondary | ICD-10-CM

## 2013-09-29 DIAGNOSIS — I25810 Atherosclerosis of coronary artery bypass graft(s) without angina pectoris: Secondary | ICD-10-CM

## 2013-09-29 HISTORY — PX: PERCUTANEOUS CORONARY STENT INTERVENTION (PCI-S): SHX6016

## 2013-09-29 HISTORY — PX: PERCUTANEOUS CORONARY STENT INTERVENTION (PCI-S): SHX5485

## 2013-09-29 LAB — BASIC METABOLIC PANEL
BUN: 13 mg/dL (ref 6–23)
CALCIUM: 8.8 mg/dL (ref 8.4–10.5)
CO2: 24 mEq/L (ref 19–32)
Chloride: 97 mEq/L (ref 96–112)
Creatinine, Ser: 1.31 mg/dL (ref 0.50–1.35)
GFR calc Af Amer: 66 mL/min — ABNORMAL LOW (ref >90)
GFR, EST NON AFRICAN AMERICAN: 57 mL/min — AB (ref >90)
GLUCOSE: 118 mg/dL — AB (ref 70–99)
POTASSIUM: 3.6 meq/L — AB (ref 3.7–5.3)
SODIUM: 136 meq/L — AB (ref 137–147)

## 2013-09-29 LAB — GLUCOSE, CAPILLARY
Glucose-Capillary: 177 mg/dL — ABNORMAL HIGH (ref 70–99)
Glucose-Capillary: 141 mg/dL — ABNORMAL HIGH (ref 70–99)
Glucose-Capillary: 190 mg/dL — ABNORMAL HIGH (ref 70–99)
Glucose-Capillary: 118 mg/dL — ABNORMAL HIGH (ref 70–99)

## 2013-09-29 LAB — CBC
HCT: 39.8 % (ref 39.0–52.0)
HEMOGLOBIN: 13.7 g/dL (ref 13.0–17.0)
MCH: 32.7 pg (ref 26.0–34.0)
MCHC: 34.4 g/dL (ref 30.0–36.0)
MCV: 95 fL (ref 78.0–100.0)
PLATELETS: 158 10*3/uL (ref 150–400)
RBC: 4.19 MIL/uL — ABNORMAL LOW (ref 4.22–5.81)
RDW: 13.9 % (ref 11.5–15.5)
WBC: 14.3 10*3/uL — ABNORMAL HIGH (ref 4.0–10.5)

## 2013-09-29 LAB — HEPARIN LEVEL (UNFRACTIONATED): Heparin Unfractionated: 0.25 IU/mL — ABNORMAL LOW (ref 0.30–0.70)

## 2013-09-29 LAB — POCT ACTIVATED CLOTTING TIME: Activated Clotting Time: 415 seconds

## 2013-09-29 SURGERY — PERCUTANEOUS CORONARY STENT INTERVENTION (PCI-S)
Anesthesia: LOCAL

## 2013-09-29 MED ORDER — SODIUM CHLORIDE 0.9 % IV SOLN
1.0000 mL/kg/h | INTRAVENOUS | Status: AC
  Administered 2013-09-29: 1 mL/kg/h via INTRAVENOUS

## 2013-09-29 MED ORDER — SODIUM CHLORIDE 0.9 % IV SOLN
0.2500 mg/kg/h | INTRAVENOUS | Status: DC
  Filled 2013-09-29: qty 250

## 2013-09-29 MED ORDER — MORPHINE SULFATE 2 MG/ML IJ SOLN
INTRAMUSCULAR | Status: AC
  Filled 2013-09-29: qty 1

## 2013-09-29 MED ORDER — MIDAZOLAM HCL 2 MG/2ML IJ SOLN
INTRAMUSCULAR | Status: AC
  Filled 2013-09-29: qty 2

## 2013-09-29 MED ORDER — FENTANYL CITRATE 0.05 MG/ML IJ SOLN
INTRAMUSCULAR | Status: AC
  Filled 2013-09-29: qty 2

## 2013-09-29 MED ORDER — LIDOCAINE HCL (PF) 1 % IJ SOLN
INTRAMUSCULAR | Status: AC
  Filled 2013-09-29: qty 30

## 2013-09-29 MED ORDER — NITROGLYCERIN 0.2 MG/ML ON CALL CATH LAB
INTRAVENOUS | Status: AC
  Filled 2013-09-29: qty 1

## 2013-09-29 MED ORDER — HEPARIN SODIUM (PORCINE) 1000 UNIT/ML IJ SOLN
INTRAMUSCULAR | Status: AC
  Filled 2013-09-29: qty 1

## 2013-09-29 MED ORDER — HEPARIN (PORCINE) IN NACL 2-0.9 UNIT/ML-% IJ SOLN
INTRAMUSCULAR | Status: AC
  Filled 2013-09-29: qty 1000

## 2013-09-29 MED ORDER — BIVALIRUDIN 250 MG IV SOLR
INTRAVENOUS | Status: AC
  Filled 2013-09-29: qty 250

## 2013-09-29 MED ORDER — VERAPAMIL HCL 2.5 MG/ML IV SOLN
INTRAVENOUS | Status: AC
  Filled 2013-09-29: qty 2

## 2013-09-29 MED ORDER — BIVALIRUDIN BOLUS VIA INFUSION
0.1000 mg/kg | Freq: Once | INTRAVENOUS | Status: DC
  Filled 2013-09-29: qty 11

## 2013-09-29 NOTE — Progress Notes (Signed)
TR BAND REMOVAL  LOCATION:    right radial  DEFLATED PER PROTOCOL:    yes  TIME BAND OFF / DRESSING APPLIED:    1230   SITE UPON ARRIVAL:    Level 0  SITE AFTER BAND REMOVAL:    Level 0  REVERSE ALLEN'S TEST:     positive  CIRCULATION SENSATION AND MOVEMENT:    Within Normal Limits   yes  COMMENTS:   Rechecked site at 1300, assessment without change, dressing dry and intact

## 2013-09-29 NOTE — H&P (View-Only) (Signed)
Patient ID: Francis Brown, male   DOB: 11/06/1950, 63 y.o.   MRN: [id number]    Subjective: No CP or SOB  Objective: Vital signs in last 24 hours: Temp:  [97.3 F (36.3 C)-99 F (37.2 C)] 99 F (37.2 C) (06/17 0741) Pulse Rate:  [62-89] 73 (06/17 0700) Resp:  [11-25] 16 (06/17 0700) BP: (86-137)/(35-91) 101/65 mmHg (06/17 0600) SpO2:  [91 %-99 %] 96 % (06/17 0700) Weight:  [233 lb 4.8 oz (105.824 kg)] 233 lb 4.8 oz (105.824 kg) (06/17 0500) Last BM Date: 09/26/13  Intake/Output from previous day: 06/16 0701 - 06/17 0700 In: 1667.1 [I.V.:1667.1] Out: 4325 [Urine:4325] Intake/Output this shift:    Medications Current Facility-Administered Medications  Medication Dose Route Frequency Provider Last Rate Last Dose  . aspirin EC tablet 81 mg  81 mg Oral Daily Nolon NationsAnton Lishmanov, MD   81 mg at 09/27/13 1233  . atorvastatin (LIPITOR) tablet 80 mg  80 mg Oral q1800 Nolon NationsAnton Lishmanov, MD   80 mg at 09/27/13 1811  . buPROPion [facility](WELLBUTRIN SR) 12 hr tablet 150 mg  150 mg Oral BID WC Nolon NationsAnton Lishmanov, MD   150 mg at 09/27/13 1811  . clopidogrel (PLAVIX) tablet 75 mg  75 mg Oral Q breakfast Nolon NationsAnton Lishmanov, MD   75 mg at 09/27/13 1233  . diazepam (VALIUM) tablet 2.5 mg  2.5 mg Oral Q6H PRN Lars MassonKatarina H Nelson, MD      . heparin ADULT infusion 100 units/mL (25000 units/250 mL)  1,850 Units/hr Intravenous Continuous Abran DukeJames Ledford, RPH 18.5 mL/hr at 09/28/13 0700 1,850 Units/hr at 09/28/13 0700  . insulin aspart (novoLOG) injection 0-5 Units  0-5 Units Subcutaneous QHS Nolon NationsAnton Lishmanov, MD      . insulin aspart (novoLOG) injection 0-9 Units  0-9 Units Subcutaneous TID WC Nolon NationsAnton Lishmanov, MD   1 Units at 09/27/13 1234  . metoprolol tartrate (LOPRESSOR) tablet 25 mg  25 mg Oral BID Wilburt FinlayBryan Hager, PA-C   25 mg at 09/27/13 2217  . nitroGLYCERIN (NITROSTAT) SL tablet 0.4 mg  0.4 mg Sublingual Q5 Min x 3 PRN Nolon NationsAnton Lishmanov, MD      . oxyCODONE (Oxy IR/ROXICODONE) immediate release tablet 5 mg  5 mg Oral Q6H PRN  Nolon NationsAnton Lishmanov, MD        PE: General appearance: alert, cooperative and no distress Lungs: clear to auscultation bilaterally Heart: regular rate and rhythm, S1, S2 normal, no murmur, click, rub or gallop Extremities: Trace LEE Pulses: 2+ and symmetric Skin: Warm and dry Neurologic: Grossly normal Left radial cath site A with good pulse   Lab Results:   Recent Labs  09/26/13 2127 09/27/13 0518 09/28/13 0246  WBC 11.0* 12.9* 15.4*  HGB 16.0 13.6 13.9  HCT 45.1 39.9 40.9  PLT 191 182 163   BMET  Recent Labs  09/26/13 2127 09/27/13 0518 09/28/13 0246  NA 137 138 135*  K 4.1 4.3 3.9  CL 97 99 98  CO2 26 26 23   GLUCOSE 135* 164* 152*  BUN 21 20 13   CREATININE 1.90* 1.78* 1.20  CALCIUM 9.9 9.0 8.6   PT/INR  Recent Labs  09/26/13 2127 09/27/13 0518  LABPROT 13.5 14.0  INR 1.05 1.10   Cholesterol  Recent Labs  09/27/13 0518  CHOL 122   Lipid Panel     Component Value Date/Time   CHOL 122 09/27/2013 0518   TRIG 119 09/27/2013 0518   HDL 33* 09/27/2013 0518   CHOLHDL 3.7 09/27/2013 0518   VLDL 24 09/27/2013 0518  LDLCALC 65 09/27/2013 0518     Cardiac Panel (last 3 results)  Recent Labs  09/26/13 2127 09/27/13 0518 09/27/13 1420  TROPONINI 1.46* 11.86* >20.00*    Assessment/Plan Francis Brown is a 63 y.o. male , with a h/o MI, s/p CABGx5 and subsequent stenting, who presented to the Emergency Department at [place] complaining of constant L sided chest pain since 6 PM on June 6 15 2015. Patient reported that the chest pain started at the time when he received the fax message from his wife. He is in the process of divorce. And reported significant amount of stress related to this. Chest pain was 2/10 in intensity, felt as if somebody punched him in the chest and was dull. No associated diaphoresis, nausea, vomiting, shortness of breath  Patient denied active symptoms of chest pain, shortness of breath, PND orthopnea, lower extremity edema, frequent  or prolonged palpitations, lower extremity edema, nausea vomiting.   Of note patient reported being outside on the boat a day prior to presentation and reported inadequate hydration.  [facility] he received 8 mg of IV morphine. At the time of presentation to the intensive care unit patient reported mild chest pain point 0.5/10 in intensity. Chest pain subsided completely after aggressive up titration of nitroglycerin. Unfortunately I also let to transient hypotension with blood pressure down to 60 mm mercury systolic. This lasted only for about 2 minutes and reversed after nitroglycerin infusion was put on hold. Patient is a 500 cc normal saline bolus, and nitroglycerin drip was restarted at 20 mics a minute after blood pressure was 100 mm mercury systolic.  Principal Problem:   NSTEMI (non-ST elevated myocardial infarction) Active Problems:   Coronary artery disease   Hypertension   Hyperlipidemia   CABGx5 1998  Plan:  CAD:  Culprit infarct was SVG to OM  EF 45-50% by echo with no mechanical complication / MR  Continue ASA/Plavix and heparin  Beta blocker No more fluid as echo and cath with high filling pressures and Cr improved  For PCI SVG RCA with Dr [place] in am HTN:  Consider low dose ACE post PCI if needed

## 2013-09-29 NOTE — CV Procedure (Signed)
    CARDIAC CATH NOTE  Name: Francis Brown MRN: [id number] DOB: 07-27-1950  Procedure: PTCA and stenting of the SVG to PDA, PTCA and stenting of the native mid LAD via the IMA graft.  Indication: 63 yo WM with recent NSTEMI due to occlusion of the SVG to OM. He was noted to have severe non-culprit disease in the SVG to the PDA with sequential 90% and 70% stenoses in the mid and distal SVG. There was also 90% mid LAD stenosis following the IMA insertion with an 80% distal LAD lesion.  Procedural Details: The left wrist was prepped, draped, and anesthetized with 1% lidocaine. Using the modified Seldinger technique, a 6 Fr slender sheath was introduced into the radial artery. 3 mg verapamil was administered through the radial sheath. Weight-based bivalirudin was given for anticoagulation. Once a therapeutic ACT was achieved, a 6 [place] RCB guide catheter was inserted into the SVG to PDA.  A prowater coronary guidewire was used to cross the lesions. The lesions were too distal to use a distal protection device. The lesions were predilated with a 2.5 balloon.  The distal lesion was then stented with a 3.0 x 12 mm Xience Alpine stent deployed at 12 atm.  The mid graft lesion was then stented with a 3.0 x 12 mm Xience Alpine stent. This stent was postdilated with a 3.25 mm noncompliant balloon.  Following PCI, there was 0% residual stenosis and TIMI-3 flow. Final angiography confirmed an excellent result.  Next we treated the disease in the native LAD. An IMA guide was used to engage the LIMA. The prowater wire was used to cross the lesion. Both lesions were dilated with a 2.0 mm balloon. This yielded a 30% residual stenosis in the distal lesion. I did not stent this lesion since the vessel was quite small in this location. The mid LAD stenosis was then stented with a 2.5 x 12 mm Xience Alpine stent. The stent was post dilated with a 2.75 mm noncompliant balloon. Following PCI, there was 0% residual stenosis and  TIMI 3 flow. Final angiography confirmed an excellent result. The patient tolerated the procedure well. There were no immediate procedural complications. A TR band was used for radial hemostasis. The patient was transferred to the post catheterization recovery area for further monitoring.  Lesion Data: Vessel: SVG to PDA Percent stenosis (pre): 90%/70% sequential TIMI-flow (pre):  3 Stent:  3.0 x 12 mm Xience Alpine stents x 2.  Percent stenosis (post): 0% TIMI-flow (post): 3  Vessel #2: Mid LAD Percent stenosis: 90% TIMI flow (pre): 3 Stent 2.5 x 12 mm Xience Alpine stent. Percent stenosis (post) 0% TIMI flow (post) 3  Distal LAD: 80% stenosis reduced to less than 30% with POBA.  Conclusions:  1. Successful stenting of the SVG to PDA x 2 with DES 2. Successful stenting of the mid LAD with DES. Successful POBA of the distal LAD  Recommendations: Continue DAPT indefinitely. Anticipate DC in am.   Peter Martinique, [place] 09/29/2013, 8:44 AM

## 2013-09-29 NOTE — Interval H&P Note (Signed)
History and Physical Interval Note:  09/29/2013 7:40 AM  Francis Brown  has presented today for surgery, with the diagnosis of nstemi  The various methods of treatment have been discussed with the patient and family. After consideration of risks, benefits and other options for treatment, the patient has consented to  Procedure(s): PERCUTANEOUS CORONARY STENT INTERVENTION (PCI-S) (N/A) as a surgical intervention .  The patient's history has been reviewed, patient examined, no change in status, stable for surgery.  I have reviewed the patient's chart and labs.  Questions were answered to the patient's satisfaction.    Cath Lab Visit (complete for each Cath Lab visit)  Clinical Evaluation Leading to the Procedure:   ACS: yes  Non-ACS:    Anginal Classification: CCS IV  Anti-ischemic medical therapy: Maximal Therapy (2 or more classes of medications)  Non-Invasive Test Results: No non-invasive testing performed  Prior CABG: Previous CABG       Theron Aristaeter [facility] CenterJordanMD,FACC 09/29/2013 7:40 AM

## 2013-09-30 DIAGNOSIS — I209 Angina pectoris, unspecified: Secondary | ICD-10-CM

## 2013-09-30 DIAGNOSIS — I25810 Atherosclerosis of coronary artery bypass graft(s) without angina pectoris: Secondary | ICD-10-CM

## 2013-09-30 LAB — BASIC METABOLIC PANEL
BUN: 13 mg/dL (ref 6–23)
CO2: 23 mEq/L (ref 19–32)
Calcium: 8.7 mg/dL (ref 8.4–10.5)
Chloride: 98 mEq/L (ref 96–112)
Creatinine, Ser: 1.2 mg/dL (ref 0.50–1.35)
GFR calc Af Amer: 74 mL/min — ABNORMAL LOW (ref >90)
GFR, EST NON AFRICAN AMERICAN: 64 mL/min — AB (ref >90)
Glucose, Bld: 134 mg/dL — ABNORMAL HIGH (ref 70–99)
POTASSIUM: 3.7 meq/L (ref 3.7–5.3)
SODIUM: 135 meq/L — AB (ref 137–147)

## 2013-09-30 LAB — CBC
HCT: 37.2 % — ABNORMAL LOW (ref 39.0–52.0)
HEMOGLOBIN: 12.9 g/dL — AB (ref 13.0–17.0)
MCH: 32.8 pg (ref 26.0–34.0)
MCHC: 34.7 g/dL (ref 30.0–36.0)
MCV: 94.7 fL (ref 78.0–100.0)
Platelets: 158 10*3/uL (ref 150–400)
RBC: 3.93 MIL/uL — ABNORMAL LOW (ref 4.22–5.81)
RDW: 13.7 % (ref 11.5–15.5)
WBC: 12 10*3/uL — ABNORMAL HIGH (ref 4.0–10.5)

## 2013-09-30 LAB — GLUCOSE, CAPILLARY: Glucose-Capillary: 144 mg/dL — ABNORMAL HIGH (ref 70–99)

## 2013-09-30 MED ORDER — NITROGLYCERIN 0.4 MG SL SUBL: 0.4000 mg | SUBLINGUAL_TABLET | SUBLINGUAL | Status: DC | PRN

## 2013-09-30 MED ORDER — METOPROLOL TARTRATE 25 MG PO TABS: 25.0000 mg | ORAL_TABLET | Freq: Two times a day (BID) | ORAL | Status: DC

## 2013-09-30 MED ORDER — ATORVASTATIN CALCIUM 80 MG PO TABS: 80.0000 mg | ORAL_TABLET | Freq: Every day | ORAL | Status: DC

## 2013-09-30 MED FILL — Sodium Chloride IV Soln 0.9%: INTRAVENOUS | Qty: 50 | Status: AC

## 2013-09-30 NOTE — Discharge Summary (Signed)
Data reviewed. Patient examined. Agree with plans as outlined.

## 2013-09-30 NOTE — Progress Notes (Signed)
UR completed Pierre Cumpton K. Paislee Szatkowski, RN, BSN, MSHL, CCM  09/30/2013 3:27 PM

## 2013-09-30 NOTE — Progress Notes (Signed)
Patient Profile: 63 year old male with history of type 2 diabetes, hypertension, hyperlipidemia and CAD with history of MI, status post CABG x5 and subsequent stenting, who [facility] with complaints of left-sided chest pain. He ruled in for non-ST elevation MI.  Subjective: No complaints. No chest pain.   Objective: Vital signs in last 24 hours: Temp:  [98.4 F (36.9 C)-99 F (37.2 C)] 98.7 F (37.1 C) (06/19 0425) Pulse Rate:  [71-90] 72 (06/19 0425) Resp:  [18-20] 18 (06/19 0425) BP: (84-117)/(50-73) 93/51 mmHg (06/19 0425) SpO2:  [92 %-96 %] 93 % (06/19 0425) Weight:  [231 lb 0.7 oz (104.8 kg)] 231 lb 0.7 oz (104.8 kg) (06/19 0601) Last BM Date: 09/29/13  Intake/Output from previous day: 06/18 0701 - 06/19 0700 In: 200 [P.O.:200] Out: 1400 [Urine:1400] Intake/Output this shift:    Medications Current Facility-Administered Medications  Medication Dose Route Frequency Provider Last Rate Last Dose  . 0.9 %  sodium chloride infusion   Intravenous Continuous Wendall StadePeter C Nishan, MD 10 mL/hr at 09/28/13 0800    . aspirin EC tablet 81 mg  81 mg Oral Daily Lars MassonKatarina H Nelson, MD      . atorvastatin (LIPITOR) tablet 80 mg  80 mg Oral q1800 Nolon NationsAnton Lishmanov, MD   80 mg at 09/29/13 1906  . buPROPion [facility](WELLBUTRIN SR) 12 hr tablet 150 mg  150 mg Oral BID WC Nolon NationsAnton Lishmanov, MD   150 mg at 09/29/13 1905  . clopidogrel (PLAVIX) tablet 75 mg  75 mg Oral Q breakfast Nolon NationsAnton Lishmanov, MD   75 mg at 09/29/13 1905  . diazepam (VALIUM) tablet 2.5 mg  2.5 mg Oral Q6H PRN Lars MassonKatarina H Nelson, MD      . insulin aspart (novoLOG) injection 0-5 Units  0-5 Units Subcutaneous QHS Nolon NationsAnton Lishmanov, MD      . insulin aspart (novoLOG) injection 0-9 Units  0-9 Units Subcutaneous TID WC Nolon NationsAnton Lishmanov, MD   2 Units at 09/29/13 1907  . metoprolol tartrate (LOPRESSOR) tablet 25 mg  25 mg Oral BID Wilburt FinlayBryan Hager, PA-C   25 mg at 09/29/13 1906  . nitroGLYCERIN (NITROSTAT) SL tablet 0.4 mg  0.4 mg  Sublingual Q5 Min x 3 PRN Nolon NationsAnton Lishmanov, MD      . oxyCODONE (Oxy IR/ROXICODONE) immediate release tablet 5 mg  5 mg Oral Q6H PRN Nolon NationsAnton Lishmanov, MD        PE: General appearance: alert, cooperative and no distress Lungs: clear to auscultation bilaterally Heart: regular rate and rhythm Extremities: no LEE Pulses: 2+ and symmetric Skin: warm and dry Neurologic: Grossly normal  Lab Results:   Recent Labs  09/28/13 0246 09/29/13 0419 09/30/13 0547  WBC 15.4* 14.3* 12.0*  HGB 13.9 13.7 12.9*  HCT 40.9 39.8 37.2*  PLT 163 158 158   BMET  Recent Labs  09/28/13 0246 09/29/13 0419 09/30/13 0547  NA 135* 136* 135*  K 3.9 3.6* 3.7  CL 98 97 98  CO2 23 24 23   GLUCOSE 152* 118* 134*  BUN 13 13 13   CREATININE 1.20 1.31 1.20  CALCIUM 8.6 8.8 8.7   Lipid Panel     Component Value Date/Time   CHOL 122 09/27/2013 0518   TRIG 119 09/27/2013 0518   HDL 33* 09/27/2013 0518   CHOLHDL 3.7 09/27/2013 0518   VLDL 24 09/27/2013 0518   LDLCALC 65 09/27/2013 0518    Cardiac Panel (last 3 results)  Recent Labs  09/27/13 1420  TROPONINI >20.00*    Studies/Results:  Diagnostic left heart catheterization 09/27/2013 Final Conclusions:  1. Severe 3 vessel obstructive CAD.  2. Patent LIMA to the LAD with severe disease in the native LAD distal to the graft insertion.  3. Patent SVG to the diagonal  4. Occluded SVG to the OM1. This appears to be the culprit lesion.  5. Patent SVG to RVM/PDA with severe graft disease  6. Elevated LVEDP.   Assessment/Plan  Principal Problem:   NSTEMI (non-ST elevated myocardial infarction) Active Problems:   Coronary artery disease   Hypertension   Hyperlipidemia  1. Non-ST elevation MI:  Status post successful stenting of the SVG to PDA x2 with the DES and stenting of the mid LAD with DES. Successful POBA of the distal LAD.  2. CAD: History of prior CABG and recent stent placements. Continue dual antiplatelet therapy indefinitely with  aspirin and Plavix. Continue beta blocker and statin. Left ventricular ejection fraction is 45-50%.  3. Hypertension: Blood pressure is a bit soft and 93/ 51. He denies any symptoms resting. We will see how he does with ambulation today.   4. Hyperlipidemia: Controlled on statin therapy. LDL is at goal at 65 mg/dL.  5. Type 2 diabetes: Continue management per PCP as an outpatient.    LOS: 4 days    [name], PA-C 09/30/2013 7:14 AM

## 2013-09-30 NOTE — Progress Notes (Signed)
CARDIAC REHAB PHASE I   PRE:  Rate/Rhythm: 71 SR  BP:  Supine:   Sitting: 109/75  Standing:    SaO2:   MODE:  Ambulation: 800 ft   POST:  Rate/Rhythm: 93 SR  BP:  Supine:   Sitting: 112/68  Standing:    SaO2:  0805-0857 Pt walked 800 ft independently with steady gait. Tolerated well. No CP. Education completed with understanding voiced. Discussed CRP 2 and pt agreed to referral to [place]. He attended program in late 6290's. Encouraged ex to help with HDL. Pt stated he has not been exercising consistently. Discussed carb counting and gave diabetic and heart healthy diets. Pt carries NTG but did not take with this admission. Reviewed NTG use.   Luetta Nuttingharlene Dunlap, RN BSN  09/30/2013 8:53 AM

## 2013-09-30 NOTE — Care Management Note (Addendum)
  Page 1 of 1   09/30/2013     3:24:53 PM CARE MANAGEMENT NOTE 09/30/2013  Patient:  Francis BelliniGRACE,Francis Brown   Account Number:  [id number]  Date Initiated:  09/27/2013  Documentation initiated by:  MAYO,HENRIETTA  Subjective/Objective Assessment:   dx NSTEMI    PCP  Betty SwazilandJordan     Action/Plan:   CM to follow for dispositon needs   Anticipated DC Date:  09/30/2013   Anticipated DC Plan:  HOME/SELF CARE      DC Planning Services  CM consult      Choice offered to / List presented to:             Status of service:  Completed, signed off Medicare Important Message given?  YES (If response is "NO", the following Medicare IM given date fields will be blank) Date Medicare IM given:  09/30/2013 Date Additional Medicare IM given:    Discharge Disposition:  HOME/SELF CARE  Per UR Regulation:  Reviewed for med. necessity/level of care/duration of stay  If discussed at Long Length of Stay Meetings, dates discussed:    Comments:  ---09/29/2013 0921 by Trudee KusterSHARYN YOUNG--- Transfer 2s/ cath/ intervention/ Inpt documented   Contact:  Libby,Chrystal Significant other [phone]

## 2013-09-30 NOTE — Discharge Instructions (Signed)
Nitroglycerin sublingual tablets What is this medicine? NITROGLYCERIN (nye troe GLI ser in) is a type of vasodilator. It relaxes blood vessels, increasing the blood and oxygen supply to your heart. This medicine is used to relieve chest pain caused by angina. It is also used to prevent chest pain before activities like climbing stairs, going outdoors in cold weather, or sexual activity. This medicine may be used for other purposes; ask your health care provider or pharmacist if you have questions. COMMON BRAND NAME(S): Nitroquick, Nitrostat, Nitrotab What should I tell my health care provider before I take this medicine? They need to know if you have any of these conditions: -anemia -head injury, recent stroke, or bleeding in the brain -liver disease -previous heart attack -an unusual or allergic reaction to nitroglycerin, other medicines, foods, dyes, or preservatives -pregnant or trying to get pregnant -breast-feeding How should I use this medicine? Take this medicine by mouth as needed. At the first sign of an angina attack (chest pain or tightness) place one tablet under your tongue. You can also take this medicine 5 to 10 minutes before an event likely to produce chest pain. Follow the directions on the prescription label. Let the tablet dissolve under the tongue. Do not swallow whole. Replace the dose if you accidentally swallow it. It will help if your mouth is not dry. Saliva around the tablet will help it to dissolve more quickly. Do not eat or drink, smoke or chew tobacco while a tablet is dissolving. If you are not better within 5 minutes after taking ONE dose of nitroglycerin, call 9-1-1 immediately to seek emergency medical care. Do not take more than 3 nitroglycerin tablets over 15 minutes. If you take this medicine often to relieve symptoms of angina, your doctor or health care professional may provide you with different instructions to manage your symptoms. If symptoms do not go away  after following these instructions, it is important to call 9-1-1 immediately. Do not take more than 3 nitroglycerin tablets over 15 minutes. Talk to your pediatrician regarding the use of this medicine in children. Special care may be needed. Overdosage: If you think you have taken too much of this medicine contact a poison control center or emergency room at once. NOTE: This medicine is only for you. Do not share this medicine with others. What if I miss a dose? This does not apply. This medicine is only used as needed. What may interact with this medicine? Do not take this medicine with any of the following medications: -certain migraine medicines like ergotamine and dihydroergotamine (DHE) -medicines used to treat erectile dysfunction like sildenafil, tadalafil, and vardenafil -riociguat This medicine may also interact with the following medications: -alteplase -aspirin -heparin -medicines for high blood pressure -medicines for mental depression -other medicines used to treat angina -phenothiazines like chlorpromazine, mesoridazine, prochlorperazine, thioridazine This list may not describe all possible interactions. Give your health care provider a list of all the medicines, herbs, non-prescription drugs, or dietary supplements you use. Also tell them if you smoke, drink alcohol, or use illegal drugs. Some items may interact with your medicine. What should I watch for while using this medicine? Tell your doctor or health care professional if you feel your medicine is no longer working. Keep this medicine with you at all times. Sit or lie down when you take your medicine to prevent falling if you feel dizzy or faint after using it. Try to remain calm. This will help you to feel better faster. If you feel  dizzy, take several deep breaths and lie down with your feet propped up, or bend forward with your head resting between your knees. You may get drowsy or dizzy. Do not drive, use machinery,  or do anything that needs mental alertness until you know how this drug affects you. Do not stand or sit up quickly, especially if you are an older patient. This reduces the risk of dizzy or fainting spells. Alcohol can make you more drowsy and dizzy. Avoid alcoholic drinks. Do not treat yourself for coughs, colds, or pain while you are taking this medicine without asking your doctor or health care professional for advice. Some ingredients may increase your blood pressure. What side effects may I notice from receiving this medicine? Side effects that you should report to your doctor or health care professional as soon as possible: -blurred vision -dry mouth -skin rash -sweating -the feeling of extreme pressure in the head -unusually weak or tired Side effects that usually do not require medical attention (report to your doctor or health care professional if they continue or are bothersome): -flushing of the face or neck -headache -irregular heartbeat, palpitations -nausea, vomiting This list may not describe all possible side effects. Call your doctor for medical advice about side effects. You may report side effects to FDA at 1-800-FDA-1088. Where should I keep my medicine? Keep out of the reach of children. Store at room temperature between 20 and 25 degrees C (68 and 77 degrees F). Store in Retail buyeroriginal container. Protect from light and moisture. Keep tightly closed. Throw away any unused medicine after the expiration date. NOTE: This sheet is a summary. It may not cover all possible information. If you have questions about this medicine, talk to your doctor, pharmacist, or health care provider.  2015, Elsevier/Gold Standard. (2013-01-20 10:27:26)

## 2013-09-30 NOTE — Progress Notes (Signed)
He is ready for discharge. Cath site stable.

## 2013-09-30 NOTE — Discharge Summary (Signed)
Physician Discharge Summary  Patient ID: Francis BelliniJames R Quintanar MRN: [id number] DOB/AGE: 61/09/1950 61 y.o.  Admit date: 09/26/2013 Discharge date: 09/30/2013  Primary Cardiologist: Dr. Mayford Knifeurner  Admission Diagnoses: Non-ST elevation myocardial infarction  Discharge Diagnoses:  Principal Problem:   NSTEMI (non-ST elevated myocardial infarction) Active Problems:   Coronary artery disease   Hypertension   Hyperlipidemia  Discharged Condition: stable  Hospital Course: The patient is a 63 year old white male, followed by Dr. Mayford Knifeurner, with a history of remote CABG and prior stenting of the saphenous vein graft to OM in 2010, who [facility] 09[facility]26/2013 with acomplaint of chest pain. He ruled in for non-ST elevation myocardial infarction. Initial troponin was 1.46.  This increased to 11.86 and greater than 20. At time of admission he was also noted to be in acute renal failure. His serum creatinine on arrival was 1.9. This was felt to be likely secondary to dehydration as the patient reported poor hydration and being outside in the heat. His diuretic and lisinopril were both held and he was hydrated with IV fluids. A 2-D echocardiogram was obtained which demonstrated mildly reduced systolic function with an estimated ejection fraction of 45-50%. There was akinesis of the mid apical inferoseptal myocardium. When compared to prior echo, his wall motion abnormality was new and EF was reduced. He ultimately underwent a diagnostic left heart catheterization by Dr. SwazilandJordan. He was found to have severe three-vessel CAD. His LIMA to the LAD was patent as well as the SVG to diagonal. His SVG to RPM/PDA was patent but there was severe graft disease. He had an occluded SVG to the OM1. Given his renal insufficiency, it was decided to do a staged intervention. He left the Cath Lab in stable condition and continued to be hydrated with IV fluids. Once his kidney function returned to baseline, he return back  to the cath lab for planned intervention. This was once again performed by Dr. SwazilandJordan. He underwent successful stenting of the SVG to PDA x2 with DES as well as successful stenting of the mid LAD with DES and successful POBA of the distal LAD. He tolerated the procedure well. He was continued on dual antiplatelet therapy with aspirin plus Plavix. He had no post-cath complications. His renal function remained stable. Serum creatinine the day of discharge was 1.2. He had no further chest pain. His cath site remained stable. He was last seen and examined by Dr. Katrinka BlazingSmith who determined that he was stable for discharge home. He is scheduled for posthospital followup with Tereso NewcomerScott Weaver, PA-C. He will continue to be followed long-term by Dr. Mayford Knifeurner.   Consults: None  Significant Diagnostic Studies:   Left heart catheterization 09/27/2013 Procedural Findings:  Hemodynamics:  AO 96/63 mean 78 mm Hg  LV 103/24 mm Hg  Coronary angiography:  Coronary dominance: right  Left mainstem: There is an eccentric 50-70% distal left main stenosis.  Left anterior descending (LAD): Occluded at the ostium.  Left circumflex (LCx): The LCx is a large vessel giving off a terminal PLOM without significant disease. The first OM is occluded at the origin.  Right coronary artery (RCA): 100% occlusion proximally.  The SVG to the RV marginal and PDA is patent. In the proximal graft there is mild disease up to 20%. After the RV marginal insertion there is a 95% focal stenosis in the SVG. This is followed by a 40-50% stenosis in the distal SVG.  The SVG to OM1 is occluded proximally within a previously stented segment.  The SVG to a large diagonal branch is patent.  The LIMA to the LAD is patent. Following the graft insertion the native LAD has an eccentric 80-90% stenosis followed by a 70% stenosis distally.     2-D echocardiogram 09/27/2013  Study Conclusions  - Left ventricle: The cavity size was normal. Systolic function  was mildly reduced. The estimated ejection fraction was in the range of 45% to 50%. There is akinesis of the mid-apicalinferoseptal myocardium. Features are consistent with a pseudonormal left ventricular filling pattern, with concomitant abnormal relaxation (grade 2 diastolic dysfunction).  Impressions:  - When compared to prior, wall motion abnormality is new and EF is reduced.    Treatments: See hospital course  Discharge Exam: Blood pressure 109/75, pulse 74, temperature 98.5 F (36.9 C), temperature source Oral, resp. rate 18, height 6' (1.829 m), weight 231 lb 0.7 oz (104.8 kg), SpO2 92.00%.   Disposition: 01-Home or Self Care  Discharge Instructions   Amb Referral to Cardiac Rehabilitation    Complete by:  As directed      Diet - low sodium heart healthy    Complete by:  As directed      Increase activity slowly    Complete by:  As directed             Medication List         aspirin EC 81 MG tablet  Take 81 mg by mouth daily.     atorvastatin 80 MG tablet  Commonly known as:  LIPITOR  Take 1 tablet (80 mg total) by mouth daily.     buPROPion 150 MG 12 hr tablet  Commonly known as:  WELLBUTRIN SR  Take 150 mg by mouth 2 (two) times daily.     CIALIS 20 MG tablet  Generic drug:  tadalafil  Take 10 mg by mouth daily as needed for erectile dysfunction.     clopidogrel 75 MG tablet  Commonly known as:  PLAVIX  Take 1 tablet (75 mg total) by mouth daily.     DERMAZINC SPRAY EX  Apply 1 application topically daily as needed (psoriasis).     hydrOXYzine 25 MG tablet  Commonly known as:  ATARAX/VISTARIL  Take 25 mg by mouth 2 (two) times daily.     lisinopril 10 MG tablet  Commonly known as:  PRINIVIL,ZESTRIL  Take 10 mg by mouth daily.     metoprolol tartrate 25 MG tablet  Commonly known as:  LOPRESSOR  Take 1 tablet (25 mg total) by mouth 2 (two) times daily.     nitroGLYCERIN 0.4 MG SL tablet  Commonly known as:  NITROSTAT  Place 1 tablet  (0.4 mg total) under the tongue every 5 (five) minutes x 3 doses as needed for chest pain.     triamterene-hydrochlorothiazide 37.5-25 MG per capsule  Commonly known as:  DYAZIDE  Take 1 capsule by mouth daily.     VIAGRA 100 MG tablet  Generic drug:  sildenafil  Take 100 mg by mouth as needed for erectile dysfunction.           Follow-up Information   Follow up with Tereso NewcomerScott Weaver, PA-C On 10/26/2013. (11:50 am (cardiology follow-up))    Specialty:  Physician Assistant   Contact information:   [address] [place] [id number] [phone]      TIME SPENT ON DISCHARGE, INCLUDING PHYSICIAN TIME: >30 MINUTES  Signed: Robbie LisSIMMONS, BRITTAINY 09/30/2013, 9:48 AM

## 2013-10-26 ENCOUNTER — Ambulatory Visit (INDEPENDENT_AMBULATORY_CARE_PROVIDER_SITE_OTHER): Payer: BC Managed Care – PPO | Admitting: Physician Assistant

## 2013-10-26 ENCOUNTER — Encounter: Payer: Self-pay | Admitting: Physician Assistant

## 2013-10-26 VITALS — BP 100/78 | HR 57 | Ht 72.0 in | Wt 235.0 lb

## 2013-10-26 DIAGNOSIS — I63239 Cerebral infarction due to unspecified occlusion or stenosis of unspecified carotid arteries: Secondary | ICD-10-CM

## 2013-10-26 DIAGNOSIS — I10 Essential (primary) hypertension: Secondary | ICD-10-CM

## 2013-10-26 DIAGNOSIS — I25810 Atherosclerosis of coronary artery bypass graft(s) without angina pectoris: Secondary | ICD-10-CM

## 2013-10-26 DIAGNOSIS — E785 Hyperlipidemia, unspecified: Secondary | ICD-10-CM

## 2013-10-26 LAB — BASIC METABOLIC PANEL
BUN: 12 mg/dL (ref 6–23)
CO2: 27 meq/L (ref 19–32)
Calcium: 9 mg/dL (ref 8.4–10.5)
Chloride: 101 mEq/L (ref 96–112)
Creatinine, Ser: 1.3 mg/dL (ref 0.4–1.5)
GFR: 60.62 mL/min (ref >60.00)
GLUCOSE: 133 mg/dL — AB (ref 70–99)
Potassium: 4.3 mEq/L (ref 3.5–5.1)
Sodium: 136 mEq/L (ref 135–145)

## 2013-10-26 NOTE — Progress Notes (Signed)
Cardiology Office Note    Date:  10/26/2013   ID:  Francis Brown, DOB 1951-04-03, MRN [id number]  PCP:  [name], [name], MD  Cardiologist:  Dr. Armanda Magic      History of Present Illness: Francis Brown is a 63 y.o. male with a hx of CAD s/p CABG (MI in [place] >>> CABG in [place], [place]) and prior DES to S-OM in 2010 [facility], [place]), prior CVA, carotid stenosis s/p L CEA, HTN, HL.    He was admitted 6/15-6/19 with a non-STEMI. Patient was in acute renal failure upon presentation secondary to dehydration. Diuretic and ACE inhibitor were held and he was hydrated with IV fluids. Echocardiogram demonstrated reduced EF and new wall motion abnormality. Creatinine improved and he underwent cardiac catheterization which demonstrated an occluded SVG-OM1, severe graft disease in the SVG-RV marginal/PDA and severe disease in the native LAD distal to the graft insertion. He was hydrated again and was brought back to the Cath Lab for staged intervention. This included a DES to the SVG-PDA, DES to the mid LAD and balloon angioplasty to the distal LAD. He returns for followup.  He is doing well.  The patient denies chest pain, shortness of breath, syncope, orthopnea, PND or significant pedal edema.  He starts cardiac rehab tomorrow.    Studies:   - Echo (09/27/13): - Left ventricle: The cavity size was normal. Systolic function was mildly reduced. The estimated ejection fraction was in the range of 45% to 50%. There is akinesis of the mid-apicalinferoseptal myocardium. Features are consistent with a pseudonormal left ventricular filling pattern, with concomitant abnormal relaxation (grade 2 diastolic dysfunction).    - LHC (09/27/13): Coronary angiography:  Coronary dominance: right  Left mainstem:  50-70% distal left main   Left anterior descending (LAD): Occluded at the ostium.  Left circumflex (LCx):  The first OM is occluded at the origin.  Right coronary artery (RCA): 100% occlusion proximally.  The  SVG to the RV marginal and PDA is patent. In the proximal graft there is mild disease up to 20%. After the RV marginal insertion there is a 95% focal stenosis in the SVG. This is followed by a 40-50% stenosis in the distal SVG.  The SVG to OM1 is occluded proximally within a previously stented segment.  The SVG to a large diagonal branch is patent.  The LIMA to the LAD is patent. Following the graft insertion the native LAD has an eccentric 80-90% stenosis followed by a 70% stenosis distally.    - PCI (09/29/13): 3.0 x 12 mm Xience Alpine stents x 2 to SVG to PDA   2.5 x 12 mm Xience Alpine stent to Mid LAD Distal LAD: 80% stenosis reduced to less than 30% with POBA   Recent Labs: 01/11/2013: ALT 36  09/27/2013: HDL Cholesterol by NMR 33*; LDL (calc) 65; TSH 5.330*  09/30/2013: Creatinine 1.20; Hemoglobin 12.9*; Potassium 3.7   Wt Readings from Last 3 Encounters:  10/26/13 235 lb (106.595 kg)  09/30/13 231 lb 0.7 oz (104.8 kg)  09/30/13 231 lb 0.7 oz (104.8 kg)     Past Medical History  Diagnosis Date  . Hypertension   . Coronary artery disease     a. 1996 s/p MI [place] [facility];  b. 1998 s/p MI in [place] with CABGx5 in [place];  b. 2010 s/p stenting to the OM in [place], [place] (Xience 4.0x12 DES, Xience 4.0x78mm DES)  . Erectile dysfunction   . Stroke     a.  2005;  b. 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  . Carotid disease, bilateral     a. 01/2013 Carotid U/S: RICA 1-39%, LICA 80-99%. Antegrade vertebral flow.  . Tobacco abuse   . Hyperlipidemia   . Bradycardia, drug induced   . CHF (congestive heart failure)   . Diabetes mellitus without complication 2015    Current Outpatient Prescriptions  Medication Sig Dispense Refill  . aspirin EC 81 MG tablet Take 81 mg by mouth daily.      [name]. atorvastatin (LIPITOR) 80 MG tablet Take 1 tablet (80 mg total) by mouth daily.  30 tablet  5  . buPROPion (WELLBUTRIN SR) 150 MG 12 hr tablet Take 150 mg by mouth  2 (two) times daily.       [name]. CIALIS 20 MG tablet Take 10 mg by mouth daily as needed for erectile dysfunction.       . clopidogrel (PLAVIX) 75 MG tablet Take 1 tablet (75 mg total) by mouth daily.  30 tablet  6  . hydrOXYzine (ATARAX/VISTARIL) 25 MG tablet Take 25 mg by mouth 2 (two) times daily.       [name]. lisinopril (PRINIVIL,ZESTRIL) 10 MG tablet Take 10 mg by mouth daily.      . metoprolol tartrate (LOPRESSOR) 25 MG tablet Take 1 tablet (25 mg total) by mouth 2 (two) times daily.  60 tablet  5  . nitroGLYCERIN (NITROSTAT) 0.4 MG SL tablet Place 1 tablet (0.4 mg total) under the tongue every 5 (five) minutes x 3 doses as needed for chest pain.  25 tablet  2  . Pyrithione Zinc (DERMAZINC SPRAY EX) Apply 1 application topically daily as needed (psoriasis).       . triamterene-hydrochlorothiazide (DYAZIDE) 37.5-25 MG per capsule Take 1 capsule by mouth daily.      [name]. VIAGRA 100 MG tablet Take 100 mg by mouth as needed for erectile dysfunction.        No current facility-administered medications for this visit.    Allergies:   Review of patient's allergies indicates no known allergies.   Social History:  The patient  reports that he quit smoking about 10 years ago. His smoking use included Cigarettes. He has a 24 pack-year smoking history. He has never used smokeless tobacco. He reports that he drinks alcohol. He reports that he does not use illicit drugs.   Family History:  The patient's family history includes CAD in his father; Heart attack in his father; Heart disease in his father and mother; Hyperlipidemia in his father and mother; Other in his mother; Varicose Veins in his mother.   ROS:  Please see the history of present illness.  He was recently started on Hydroxyzine for anxiety and is having a lot of dry mouth.   All other systems reviewed and negative.   PHYSICAL EXAM: VS:  BP 100/78  Pulse 57  Ht 6' (1.829 m)  Wt 235 lb (106.595 kg)  BMI 31.86 kg/m2 Well nourished, well developed,  in no acute distress HEENT: normal Neck: no JVD Cardiac:  normal S1, S2; RRR; no murmur Lungs:  clear to auscultation bilaterally, no wheezing, rhonchi or rales Abd: soft, nontender, no hepatomegaly Ext: no edemaL wrist without hematoma or mass Skin: warm and dry Neuro:  CNs 2-12 intact, no focal abnormalities noted  EKG:  Sinus brady, HR 57, inf Q waves, NSSTTW changes     ASSESSMENT AND PLAN:  1. Coronary artery disease involving autologous vein coronary bypass graft  without angina pectoris:  He is doing well after recent NSTEMI tx with DES to S-PDA, DES to LAD and POBA to dist LAD.  He remains on DAP therapy.  He starts cardiac rehab tomorrow.  He denies angina. Continue ASA, Plavix, beta blocker and statin. 2. Essential hypertension: Ccontrolled.  Repeat BMET today to recheck renal function and K+.   3. Hyperlipemia:  Lipitor increased to 80 QD during hospital stay.  Arrange follow up Lipids and LFTs. 4. Occlusion and stenosis of carotid artery with cerebral infarction-Left:  Follow up with [facility]. 5. Disposition:  F/u with Dr. Armanda Magic in 2 mos.    Signed, Brynda Rim, MHS 10/26/2013 12:29 PM    [facility] [address] [place], [place], [place]  [address] Phone: [phone]; Fax: [phone]

## 2013-10-26 NOTE — Patient Instructions (Signed)
Your physician recommends that you continue on your current medications as directed. Please refer to the Current Medication list given to you today.  Your physician recommends that you go to the lab today for BMET  Your physician recommends that you return for a FASTING lipid profile and ALT in 6-8 weeks  Your physician recommends that you schedule a follow-up appointment in: 2 months with Dr Mayford Knifeurner

## 2013-10-27 ENCOUNTER — Telehealth: Payer: Self-pay | Admitting: *Deleted

## 2013-10-27 ENCOUNTER — Ambulatory Visit (HOSPITAL_COMMUNITY): Payer: BC Managed Care – PPO

## 2013-10-27 NOTE — Telephone Encounter (Signed)
pt notified about lab results with verbal understanding  

## 2013-10-31 ENCOUNTER — Encounter (HOSPITAL_COMMUNITY): Payer: BC Managed Care – PPO

## 2013-11-02 ENCOUNTER — Encounter (HOSPITAL_COMMUNITY): Payer: BC Managed Care – PPO

## 2013-11-03 ENCOUNTER — Encounter (HOSPITAL_COMMUNITY)
Admission: RE | Admit: 2013-11-03 | Discharge: 2013-11-03 | Disposition: A | Discharge status: Home / Self Care | Payer: BC Managed Care – PPO | Source: Ambulatory Visit | Attending: Cardiology | Admitting: Cardiology

## 2013-11-03 DIAGNOSIS — I252 Old myocardial infarction: Secondary | ICD-10-CM | POA: Insufficient documentation

## 2013-11-03 DIAGNOSIS — I63239 Cerebral infarction due to unspecified occlusion or stenosis of unspecified carotid arteries: Secondary | ICD-10-CM | POA: Insufficient documentation

## 2013-11-03 DIAGNOSIS — I2510 Atherosclerotic heart disease of native coronary artery without angina pectoris: Secondary | ICD-10-CM | POA: Insufficient documentation

## 2013-11-03 DIAGNOSIS — I498 Other specified cardiac arrhythmias: Secondary | ICD-10-CM | POA: Insufficient documentation

## 2013-11-03 DIAGNOSIS — Z5189 Encounter for other specified aftercare: Secondary | ICD-10-CM | POA: Insufficient documentation

## 2013-11-03 DIAGNOSIS — I214 Non-ST elevation (NSTEMI) myocardial infarction: Secondary | ICD-10-CM | POA: Insufficient documentation

## 2013-11-03 NOTE — Progress Notes (Signed)
Cardiac Rehab Medication Review by a Pharmacist  Does the patient  feel that his/her medications are working for him/her?  yes  Has the patient been experiencing any side effects to the medications prescribed?  yes  Does the patient measure his/her own blood pressure or blood glucose at home?  yes   Does the patient have any problems obtaining medications due to transportation or finances?   no  Understanding of regimen: good Understanding of indications: good Potential of compliance: good    Pharmacist comments:  Pleasant 63 yo M presenting with a good understanding of his medication regimen. He states that since he started the new dosage of his Wellbutrin he has noticed that his face has become more dry. We talked about using moisturizers and to speak to his PCP in more detail about this if it does not subside in the next few weeks. No other medication related issues were noted during our visit.   Margie BilletErika K. von Vajna, PharmD Clinical Pharmacist - Resident Pager: [phone] Pharmacy: [phone] 11/03/2013 8:21 AM

## 2013-11-04 ENCOUNTER — Encounter (HOSPITAL_COMMUNITY): Payer: BC Managed Care – PPO

## 2013-11-07 ENCOUNTER — Encounter (HOSPITAL_COMMUNITY)
Admission: RE | Admit: 2013-11-07 | Discharge: 2013-11-07 | Disposition: A | Discharge status: Home / Self Care | Payer: BC Managed Care – PPO | Source: Ambulatory Visit | Attending: Cardiology | Admitting: Cardiology

## 2013-11-07 DIAGNOSIS — I214 Non-ST elevation (NSTEMI) myocardial infarction: Secondary | ICD-10-CM | POA: Diagnosis not present

## 2013-11-07 DIAGNOSIS — I498 Other specified cardiac arrhythmias: Secondary | ICD-10-CM | POA: Diagnosis not present

## 2013-11-07 DIAGNOSIS — Z5189 Encounter for other specified aftercare: Secondary | ICD-10-CM | POA: Diagnosis not present

## 2013-11-07 DIAGNOSIS — I2510 Atherosclerotic heart disease of native coronary artery without angina pectoris: Secondary | ICD-10-CM | POA: Diagnosis present

## 2013-11-07 DIAGNOSIS — I252 Old myocardial infarction: Secondary | ICD-10-CM | POA: Diagnosis not present

## 2013-11-07 DIAGNOSIS — I63239 Cerebral infarction due to unspecified occlusion or stenosis of unspecified carotid arteries: Secondary | ICD-10-CM | POA: Diagnosis not present

## 2013-11-07 LAB — GLUCOSE, CAPILLARY
Glucose-Capillary: 149 mg/dL — ABNORMAL HIGH (ref 70–99)
Glucose-Capillary: 129 mg/dL — ABNORMAL HIGH (ref 70–99)

## 2013-11-07 NOTE — Progress Notes (Signed)
Pt started cardiac rehab today.  Pt tolerated light exercise without difficulty. Telemetry rhythm Sinus. Vital sign stable. Will continue to monitor the patient throughout  the program. Mr Francis Brown is a diet controlled diabetic. Francis Brown's goals are to loose weight and to get back into an exercise routine.

## 2013-11-09 ENCOUNTER — Encounter (HOSPITAL_COMMUNITY)
Admission: RE | Admit: 2013-11-09 | Discharge: 2013-11-09 | Disposition: A | Discharge status: Home / Self Care | Payer: BC Managed Care – PPO | Source: Ambulatory Visit | Attending: Cardiology | Admitting: Cardiology

## 2013-11-09 DIAGNOSIS — Z5189 Encounter for other specified aftercare: Secondary | ICD-10-CM | POA: Diagnosis not present

## 2013-11-09 LAB — GLUCOSE, CAPILLARY
GLUCOSE-CAPILLARY: 113 mg/dL — AB (ref 70–99)
Glucose-Capillary: 107 mg/dL — ABNORMAL HIGH (ref 70–99)

## 2013-11-11 ENCOUNTER — Encounter (HOSPITAL_COMMUNITY)
Admission: RE | Admit: 2013-11-11 | Discharge: 2013-11-11 | Disposition: A | Discharge status: Home / Self Care | Payer: BC Managed Care – PPO | Source: Ambulatory Visit | Attending: Cardiology | Admitting: Cardiology

## 2013-11-11 DIAGNOSIS — Z5189 Encounter for other specified aftercare: Secondary | ICD-10-CM | POA: Diagnosis not present

## 2013-11-14 ENCOUNTER — Encounter (HOSPITAL_COMMUNITY)
Admission: RE | Admit: 2013-11-14 | Discharge: 2013-11-14 | Disposition: A | Discharge status: Home / Self Care | Payer: BC Managed Care – PPO | Source: Ambulatory Visit | Attending: Cardiology | Admitting: Cardiology

## 2013-11-14 DIAGNOSIS — Z9861 Coronary angioplasty status: Secondary | ICD-10-CM | POA: Diagnosis present

## 2013-11-14 DIAGNOSIS — I214 Non-ST elevation (NSTEMI) myocardial infarction: Secondary | ICD-10-CM | POA: Diagnosis present

## 2013-11-16 ENCOUNTER — Encounter (HOSPITAL_COMMUNITY)
Admission: RE | Admit: 2013-11-16 | Discharge: 2013-11-16 | Disposition: A | Discharge status: Home / Self Care | Payer: BC Managed Care – PPO | Source: Ambulatory Visit | Attending: Cardiology | Admitting: Cardiology

## 2013-11-16 DIAGNOSIS — I214 Non-ST elevation (NSTEMI) myocardial infarction: Secondary | ICD-10-CM | POA: Diagnosis not present

## 2013-11-18 ENCOUNTER — Telehealth (HOSPITAL_COMMUNITY): Payer: Self-pay | Admitting: *Deleted

## 2013-11-18 ENCOUNTER — Encounter (HOSPITAL_COMMUNITY): Admission: RE | Admit: 2013-11-18 | Payer: BC Managed Care – PPO | Source: Ambulatory Visit

## 2013-11-21 ENCOUNTER — Encounter (HOSPITAL_COMMUNITY)
Admission: RE | Admit: 2013-11-21 | Discharge: 2013-11-21 | Disposition: A | Discharge status: Home / Self Care | Payer: BC Managed Care – PPO | Source: Ambulatory Visit | Attending: Cardiology | Admitting: Cardiology

## 2013-11-21 DIAGNOSIS — I214 Non-ST elevation (NSTEMI) myocardial infarction: Secondary | ICD-10-CM | POA: Diagnosis not present

## 2013-11-23 ENCOUNTER — Encounter (HOSPITAL_COMMUNITY): Payer: BC Managed Care – PPO

## 2013-11-25 ENCOUNTER — Encounter (HOSPITAL_COMMUNITY): Payer: Self-pay | Admitting: *Deleted

## 2013-11-25 ENCOUNTER — Encounter (HOSPITAL_COMMUNITY): Payer: BC Managed Care – PPO

## 2013-11-28 ENCOUNTER — Encounter (HOSPITAL_COMMUNITY): Payer: BC Managed Care – PPO

## 2013-11-30 ENCOUNTER — Telehealth: Payer: Self-pay | Admitting: Cardiology

## 2013-11-30 ENCOUNTER — Encounter (HOSPITAL_COMMUNITY): Payer: BC Managed Care – PPO

## 2013-11-30 NOTE — Telephone Encounter (Signed)
New message     Pt is getting a divorce.  Want letter from Dr Mayford Knifeurner stating that he needs to decrease stress in his life possibly changing his job, etc.  Pt has had heart attacks, strokes, cabg and stents in the past.

## 2013-11-30 NOTE — Telephone Encounter (Signed)
Please let patient know that he has been stable from a cardiac standpoint so I cannot state that he needs to change jobs for medical reasons

## 2013-11-30 NOTE — Telephone Encounter (Signed)
The pt is advised that Dr Mayford Knifeurner is not in the office today and that I am forwarding this note to her and her nurse Duwayne HeckDanielle to address when Dr Mayford Knifeurner returns to the office. He verbalized understanding.

## 2013-11-30 NOTE — Telephone Encounter (Signed)
Spoke with pt and gave him information from Dr. Mayford Knifeurner

## 2013-12-01 ENCOUNTER — Encounter: Payer: Self-pay | Admitting: Cardiology

## 2013-12-01 NOTE — Telephone Encounter (Signed)
Dr Mayford Knifeurner requested I send back to her for review

## 2013-12-01 NOTE — Telephone Encounter (Signed)
Letter forwarded to you.

## 2013-12-01 NOTE — Telephone Encounter (Signed)
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 Each heart attack have been in different countries. Pt wants to stop travelling. Pt stated he's just to physically exhausted. Pt is getting divorced and he cant work like he has been. His lawyer needs him to have a note stating what he should be limited to with his job. Such as spending 30 hours in a airplane often for his job. He noticed all his event always happen when he's out of town/country. He states he needs to be able to have a letter stating he should not be stressed as much as he was and travelling. He wants to be able to have a stable job in town where he does not need to travel and his stress level can be controlled. Pt said limited travel is ok but nothing like what it has been. He travels internationally 10-14 times a year and with his divorce if he isnt able to do this, they will make it so he has to work like that constantly still and he fears for his heart.   TO Dr Mayford Knifeurner to advise.

## 2013-12-01 NOTE — Telephone Encounter (Signed)
What type of work dose he do

## 2013-12-02 ENCOUNTER — Encounter (HOSPITAL_COMMUNITY)
Admission: RE | Admit: 2013-12-02 | Discharge: 2013-12-02 | Disposition: A | Discharge status: Home / Self Care | Payer: BC Managed Care – PPO | Source: Ambulatory Visit | Attending: Cardiology | Admitting: Cardiology

## 2013-12-02 DIAGNOSIS — I214 Non-ST elevation (NSTEMI) myocardial infarction: Secondary | ICD-10-CM | POA: Diagnosis not present

## 2013-12-02 NOTE — Telephone Encounter (Signed)
Printed out for pt and put your stamp on it. Will call pt once we open to let him know the letter will be up front for him to pick up.

## 2013-12-02 NOTE — Telephone Encounter (Signed)
Letter up front for pt. Pt is aware to pick up. He will stop by today.

## 2013-12-05 ENCOUNTER — Encounter (HOSPITAL_COMMUNITY): Payer: BC Managed Care – PPO

## 2013-12-07 ENCOUNTER — Other Ambulatory Visit: Payer: BC Managed Care – PPO

## 2013-12-07 ENCOUNTER — Encounter (HOSPITAL_COMMUNITY)
Admission: RE | Admit: 2013-12-07 | Discharge: 2013-12-07 | Disposition: A | Discharge status: Home / Self Care | Payer: BC Managed Care – PPO | Source: Ambulatory Visit | Attending: Cardiology | Admitting: Cardiology

## 2013-12-07 DIAGNOSIS — I214 Non-ST elevation (NSTEMI) myocardial infarction: Secondary | ICD-10-CM | POA: Diagnosis not present

## 2013-12-09 ENCOUNTER — Encounter (HOSPITAL_COMMUNITY)
Admission: RE | Admit: 2013-12-09 | Discharge: 2013-12-09 | Disposition: A | Discharge status: Home / Self Care | Payer: BC Managed Care – PPO | Source: Ambulatory Visit | Attending: Cardiology | Admitting: Cardiology

## 2013-12-09 DIAGNOSIS — I214 Non-ST elevation (NSTEMI) myocardial infarction: Secondary | ICD-10-CM | POA: Diagnosis not present

## 2013-12-12 ENCOUNTER — Encounter (HOSPITAL_COMMUNITY)
Admission: RE | Admit: 2013-12-12 | Discharge: 2013-12-12 | Disposition: A | Discharge status: Home / Self Care | Payer: BC Managed Care – PPO | Source: Ambulatory Visit | Attending: Cardiology | Admitting: Cardiology

## 2013-12-12 DIAGNOSIS — I214 Non-ST elevation (NSTEMI) myocardial infarction: Secondary | ICD-10-CM | POA: Diagnosis not present

## 2013-12-13 ENCOUNTER — Other Ambulatory Visit: Payer: Self-pay

## 2013-12-13 ENCOUNTER — Encounter: Payer: Self-pay | Admitting: Family

## 2013-12-13 ENCOUNTER — Ambulatory Visit (INDEPENDENT_AMBULATORY_CARE_PROVIDER_SITE_OTHER): Payer: BC Managed Care – PPO | Admitting: Family

## 2013-12-13 VITALS — BP 123/83 | HR 59 | Resp 16 | Ht 71.0 in | Wt 243.0 lb

## 2013-12-13 DIAGNOSIS — I63239 Cerebral infarction due to unspecified occlusion or stenosis of unspecified carotid arteries: Secondary | ICD-10-CM

## 2013-12-13 DIAGNOSIS — Z48812 Encounter for surgical aftercare following surgery on the circulatory system: Secondary | ICD-10-CM

## 2013-12-13 MED ORDER — TRIAMTERENE-HCTZ 37.5-25 MG PO CAPS: 1.0000 | ORAL_CAPSULE | Freq: Every day | ORAL | Status: DC

## 2013-12-13 NOTE — Progress Notes (Addendum)
Established Carotid Patient   History of Present Illness  Francis Brown is a 63 y.o. male patient of Dr. [name] who is s/p L CEA (Date: 01/18/13).  He had a stroke in 2005, TIA in 2012 and 2014 right before the left CEA, as manifested by right UE contracture an numbness, denies amaurosis fugax, denies unilateral facial drooping, denies aphasia. The 2005 stroke affected his left hand as numbness which has resolved.  He saw Dr. [name], neurology, in the past,  He had 3 MI's, 1996 with angioplasty, 1998 with 5 vessel CABG, 2010 with 2 stents.  Pt denies New Medical or Surgical History.  He was last seen in our office in May, 2015 with a carotid Duplex.  He returns today for discussion of inability to continue his current job. He is undergoing a divorce.  He has a letter from his cardiologist, Dr. Mayford Knife, on file re this and his cardiac status.  Past Medical History  Diagnosis Date  . Hypertension   . Coronary artery disease     a. 1996 s/p MI [place] [facility] Rx;  b. 1998 s/p MI in [place] with CABGx5 in [place];  b. 2010 s/p stenting to the OM in [place], [place] (Xience 4.0x12 DES, Xience 4.0x49mm DES)  . Erectile dysfunction   . Stroke     a. 2005;  b. 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  . Carotid disease, bilateral     a. 01/2013 Carotid U/S: RICA 1-39%, LICA 80-99%. Antegrade vertebral flow.  . Tobacco abuse   . Hyperlipidemia   . Bradycardia, drug induced   . CHF (congestive heart failure)   . Diabetes mellitus without complication 2015  . Myocardial infarction October 17, 2013    Heart Attack    Social History History  Substance Use Topics  . Smoking status: Former Smoker -- 0.80 packs/day for 30 years    Types: Cigarettes    Quit date: 08/20/2003  . Smokeless tobacco: Never Used  . Alcohol Use: Yes     Comment: occ    Family History Family History  Problem Relation Age of Onset  . CAD Father     died @ 59  . Heart disease Father     Before 65  years old  . Hyperlipidemia Father   . Heart attack Father   . Other Mother     died @ 75  . Heart disease Mother   . Hyperlipidemia Mother   . Varicose Veins Mother     Surgical History Past Surgical History  Procedure Laterality Date  . Cardiac surgery  07/29/1996  . Coronary stent placement    . Coronary artery bypass graft    . Coronary angioplasty    . Colonoscopy    . Endarterectomy Left 01/18/2013    Procedure: LEFT CAROTID ENDARTERECTOMY WITH PATCH ANGIOPLASTY;  Surgeon: [name], MD;  Location: [facility] OR;  Service: Vascular;  Laterality: Left;  . Carotid endarterectomy Left 01-18-13    cea  . Left heart cath Left September 27, 2013  . Percutaneous coronary stent intervention (pci-s)  September 29, 2013    No Known Allergies  Current Outpatient Prescriptions  Medication Sig Dispense Refill  . aspirin EC 81 MG tablet Take 81 mg by mouth daily.      [name] atorvastatin (LIPITOR) 80 MG tablet Take 1 tablet (80 mg total) by mouth daily.  30 tablet  5  . buPROPion (WELLBUTRIN XL) 300 MG 24 hr tablet Take 300 mg  by mouth daily.      . clopidogrel (PLAVIX) 75 MG tablet Take 1 tablet (75 mg total) by mouth daily.  30 tablet  6  . hydrOXYzine (ATARAX/VISTARIL) 25 MG tablet Take 25 mg by mouth 2 (two) times daily.       [name] lisinopril (PRINIVIL,ZESTRIL) 10 MG tablet Take 10 mg by mouth daily.      . metoprolol tartrate (LOPRESSOR) 25 MG tablet Take 1 tablet (25 mg total) by mouth 2 (two) times daily.  60 tablet  5  . nitroGLYCERIN (NITROSTAT) 0.4 MG SL tablet Place 1 tablet (0.4 mg total) under the tongue every 5 (five) minutes x 3 doses as needed for chest pain.  25 tablet  2  . Pyrithione Zinc (DERMAZINC SPRAY EX) Apply 1 application topically daily as needed (psoriasis).       . triamterene-hydrochlorothiazide (DYAZIDE) 37.5-25 MG per capsule Take 1 each (1 capsule total) by mouth daily.  30 capsule  1  . VIAGRA 100 MG tablet Take 100 mg by mouth as needed for erectile dysfunction.        No  current facility-administered medications for this visit.    Review of Systems : See HPI for pertinent positives and negatives.  Physical Examination  Filed Vitals:   12/13/13 1616 12/13/13 1619  BP: 124/76 123/83  Pulse: 58 59  Resp:  16  Height:   (1.803 m)  Weight:  243 lb (110.224 kg)  SpO2:  99%   Body mass index is 33.91 kg/(m^2).  General: WDWN male in NAD  GAIT: normal  Eyes: Pupils equal  Pulmonary: Non-labored Neurologic: A&O X 3; Appropriate Affect ; Speech is normal   Assessment: Francis Brown is a 63 y.o. male who who is s/p L CEA (Date: 01/18/13).  He had a stroke in 2005, TIA in 2012 and 2014 right before the left CEA, as manifested by right UE contracture an numbness, denies amaurosis fugax, denies unilateral facial drooping, denies aphasia. The 2005 stroke affected his left hand as numbness which has resolved.  He saw Dr. [name], neurology, in the past,  He had 3 MI's, 1996 with angioplasty, 1998 with 5 vessel CABG, 2010 with 2 stents.  Pt denies New Medical or Surgical History.  He had no neurological sequela nor complications from the left CEA on 01/18/13. He seeks another letter from another medical provider that he is unable to continue doing his current job. I discussed his case with Dr. Arbie Cookey.  Since he had no complications from his left CEA, and if he seeks documentation from another medical provider re his activity restrictions, he was advised to check with his neurologist as a possiblility re disability stratification.    Plan: Follow-up in as scheduled in May, 2016 for carotid Duplex and see me.   Charisse March, RN, MSN, FNP-C [facility] Office: [phone]  Clinic Physician: Early  12/13/2013 4:35 PM

## 2013-12-14 ENCOUNTER — Encounter (HOSPITAL_COMMUNITY)
Admission: RE | Admit: 2013-12-14 | Discharge: 2013-12-14 | Disposition: A | Discharge status: Home / Self Care | Payer: BC Managed Care – PPO | Source: Ambulatory Visit | Attending: Cardiology | Admitting: Cardiology

## 2013-12-14 DIAGNOSIS — Z9861 Coronary angioplasty status: Secondary | ICD-10-CM | POA: Insufficient documentation

## 2013-12-14 DIAGNOSIS — I214 Non-ST elevation (NSTEMI) myocardial infarction: Secondary | ICD-10-CM | POA: Diagnosis not present

## 2013-12-14 NOTE — Progress Notes (Signed)
Francis Brown's weight is up 2.6kg today from Monday. Fredrik Cove said he ran out of his dyazide over the weekend and did not restart taking the medication until today. Upon assessment lung fields clear. Trace lower leg edema noted. Francis Brown denies shortness of breath. Oxygen saturation 97-98% on room air. Patient knows to avoid salt intake. Will continue to monitor the patient throughout  the program.

## 2013-12-14 NOTE — Progress Notes (Signed)
Nutrition Note Spoke with pt. Pt just returned his MEDFICTS survey. Will discuss results with pt in the near future. Continue client-centered nutrition education by RD as part of interdisciplinary care.  Monitor and evaluate progress toward nutrition goal with team.  Mickle Plumb, M.Ed, RD, LDN, CDE 12/14/2013 3:57 PM

## 2013-12-16 ENCOUNTER — Encounter (HOSPITAL_COMMUNITY)
Admission: RE | Admit: 2013-12-16 | Discharge: 2013-12-16 | Disposition: A | Discharge status: Home / Self Care | Payer: BC Managed Care – PPO | Source: Ambulatory Visit | Attending: Cardiology | Admitting: Cardiology

## 2013-12-16 DIAGNOSIS — I214 Non-ST elevation (NSTEMI) myocardial infarction: Secondary | ICD-10-CM | POA: Diagnosis not present

## 2013-12-16 NOTE — Progress Notes (Signed)
Reviewed patient's quality of life questionnaire. Francis Brown denies being depressed. Will forward questionnaire to Dr Norris Cross office for review. [name] had low scores in all areas except family. Will forward questionnaire to Dr Norris Cross office for review.

## 2013-12-21 ENCOUNTER — Encounter (HOSPITAL_COMMUNITY): Payer: BC Managed Care – PPO

## 2013-12-23 ENCOUNTER — Encounter (HOSPITAL_COMMUNITY)
Admission: RE | Admit: 2013-12-23 | Discharge: 2013-12-23 | Disposition: A | Discharge status: Home / Self Care | Payer: BC Managed Care – PPO | Source: Ambulatory Visit | Attending: Cardiology | Admitting: Cardiology

## 2013-12-23 DIAGNOSIS — I214 Non-ST elevation (NSTEMI) myocardial infarction: Secondary | ICD-10-CM | POA: Diagnosis not present

## 2013-12-26 ENCOUNTER — Encounter (HOSPITAL_COMMUNITY): Payer: BC Managed Care – PPO

## 2013-12-27 ENCOUNTER — Telehealth: Payer: Self-pay | Admitting: Cardiology

## 2013-12-27 NOTE — Telephone Encounter (Signed)
New problem   Pt need to speak to you concerning another letter he needs. Please call pt.

## 2013-12-27 NOTE — Telephone Encounter (Signed)
Spoke with pt he needed Dr [name] to write a letter for him. I explained Dr [name] was at the [facility] office and he would have to call and speak with his nurse in regards to the letter. Pt agreed with Plan. I gave number to the pt for the office.

## 2013-12-28 ENCOUNTER — Ambulatory Visit (INDEPENDENT_AMBULATORY_CARE_PROVIDER_SITE_OTHER): Payer: BC Managed Care – PPO | Admitting: Cardiology

## 2013-12-28 ENCOUNTER — Encounter: Payer: Self-pay | Admitting: Cardiology

## 2013-12-28 ENCOUNTER — Encounter (HOSPITAL_COMMUNITY)
Admission: RE | Admit: 2013-12-28 | Discharge: 2013-12-28 | Disposition: A | Discharge status: Home / Self Care | Payer: BC Managed Care – PPO | Source: Ambulatory Visit | Attending: Cardiology | Admitting: Cardiology

## 2013-12-28 VITALS — BP 132/86 | HR 57 | Ht 72.0 in | Wt 241.8 lb

## 2013-12-28 DIAGNOSIS — I2510 Atherosclerotic heart disease of native coronary artery without angina pectoris: Secondary | ICD-10-CM

## 2013-12-28 DIAGNOSIS — I63239 Cerebral infarction due to unspecified occlusion or stenosis of unspecified carotid arteries: Secondary | ICD-10-CM

## 2013-12-28 DIAGNOSIS — I214 Non-ST elevation (NSTEMI) myocardial infarction: Secondary | ICD-10-CM | POA: Diagnosis not present

## 2013-12-28 DIAGNOSIS — I252 Old myocardial infarction: Secondary | ICD-10-CM

## 2013-12-28 DIAGNOSIS — I10 Essential (primary) hypertension: Secondary | ICD-10-CM

## 2013-12-28 DIAGNOSIS — E785 Hyperlipidemia, unspecified: Secondary | ICD-10-CM

## 2013-12-28 NOTE — Patient Instructions (Signed)
Your physician recommends that you return for a FASTING lipid and alt today.   Your physician recommends that you continue on your current medications as directed. Please refer to the Current Medication list given to you today.  Your physician wants you to follow-up in: 6 months with Dr. Mayford Knife. You will receive a reminder letter in the mail two months in advance. If you don't receive a letter, please call our office to schedule the follow-up appointment.

## 2013-12-28 NOTE — Addendum Note (Signed)
Addended by: Celine Ahr on: 12/28/2013 09:13 AM   Modules accepted: Orders

## 2013-12-28 NOTE — Progress Notes (Signed)
[address] [place], [place]  [address] Phone: [phone] Fax:  [phone]  Date:  12/28/2013   ID:  Francis Brown, DOB Mar 04, 1951, MRN [id number]  PCP:  [name], [name], MD  Cardiologist:  Armanda Magic, MD     History of Present Illness: Francis Brown is a 63 y.o. male with a hx of CAD s/p CABG (MI in [place] >>> CABG in [place], [place]) and prior DES to S-OM in 2010 [facility], [place]), prior CVA, carotid stenosis s/p L CEA, HTN, HL.  He was admitted 6/15-6/19 with a non-STEMI. Patient was in acute renal failure upon presentation secondary to dehydration. Diuretic and ACE inhibitor were held and he was hydrated with IV fluids. Echocardiogram demonstrated reduced EF and new wall motion abnormality. Creatinine improved and he underwent cardiac catheterization which demonstrated an occluded SVG-OM1, severe graft disease in the SVG-RV marginal/PDA and severe disease in the native LAD distal to the graft insertion. He was hydrated again and was brought back to the Cath Lab for staged intervention. This included a DES to the SVG-PDA, DES to the mid LAD and balloon angioplasty to the distal LAD. He returns for followup. He is doing well. The patient denies chest pain, shortness of breath, syncope, orthopnea, PND or significant pedal edema.    Wt Readings from Last 3 Encounters:  12/28/13 241 lb 12.8 oz (109.68 kg)  12/13/13 243 lb (110.224 kg)  11/03/13 238 lb 5.1 oz (108.1 kg)     Past Medical History  Diagnosis Date  . Hypertension   . Coronary artery disease     a. 1996 s/p MI [place] [facility];  b. 1998 s/p MI in [place] with CABGx5 in [place];  b. 2010 s/p stenting to the OM in [place], [place] (Xience 4.0x12 DES, Xience 4.0x10mm DES)  . Erectile dysfunction   . Stroke     a. 2005;  b. 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  . Carotid disease, bilateral     a. 01/2013 Carotid U/S: RICA 1-39%, LICA 80-99%. Antegrade vertebral flow.  . Tobacco abuse   .  Hyperlipidemia   . Bradycardia, drug induced   . CHF (congestive heart failure)   . Diabetes mellitus without complication 2015  . Myocardial infarction October 17, 2013    Heart Attack    Current Outpatient Prescriptions  Medication Sig Dispense Refill  . aspirin EC 81 MG tablet Take 81 mg by mouth daily.      [name] atorvastatin (LIPITOR) 80 MG tablet Take 1 tablet (80 mg total) by mouth daily.  30 tablet  5  . buPROPion (WELLBUTRIN XL) 300 MG 24 hr tablet Take 300 mg by mouth daily.      . clopidogrel (PLAVIX) 75 MG tablet Take 1 tablet (75 mg total) by mouth daily.  30 tablet  6  . hydrOXYzine (ATARAX/VISTARIL) 25 MG tablet Take 25 mg by mouth 2 (two) times daily.       [name] lisinopril (PRINIVIL,ZESTRIL) 10 MG tablet Take 10 mg by mouth daily.      . metoprolol tartrate (LOPRESSOR) 25 MG tablet Take 1 tablet (25 mg total) by mouth 2 (two) times daily.  60 tablet  5  . nitroGLYCERIN (NITROSTAT) 0.4 MG SL tablet Place 1 tablet (0.4 mg total) under the tongue every 5 (five) minutes x 3 doses as needed for chest pain.  25 tablet  2  . Pyrithione Zinc (DERMAZINC SPRAY EX) Apply 1 application topically daily as  needed (psoriasis).       . triamterene-hydrochlorothiazide (DYAZIDE) 37.5-25 MG per capsule Take 1 each (1 capsule total) by mouth daily.  30 capsule  1  . VIAGRA 100 MG tablet Take 100 mg by mouth as needed for erectile dysfunction.        No current facility-administered medications for this visit.    Allergies:   No Known Allergies  Social History:  The patient  reports that he quit smoking about 10 years ago. His smoking use included Cigarettes. He has a 24 pack-year smoking history. He has never used smokeless tobacco. He reports that he drinks alcohol. He reports that he does not use illicit drugs.   Family History:  The patient's family history includes CAD in his father; Heart attack in his father; Heart disease in his father and mother; Hyperlipidemia in his father and mother; Other  in his mother; Varicose Veins in his mother.   ROS:  Please see the history of present illness.      All other systems reviewed and negative.   PHYSICAL EXAM: VS:  BP 132/86  Pulse 57  Ht 6' (1.829 m)  Wt 241 lb 12.8 oz (109.68 kg)  BMI 32.79 kg/m2 Well nourished, well developed, in no acute distress HEENT: normal Neck: no JVD Cardiac:  normal S1, S2; RRR; no murmur Lungs:  clear to auscultation bilaterally, no wheezing, rhonchi or rales Abd: soft, nontender, no hepatomegaly Ext: no edema Skin: warm and dry Neuro:  CNs 2-12 intact, no focal abnormalities noted  ASSESSMENT AND PLAN:  1. Coronary artery disease involving autologous vein coronary bypass graft without angina pectoris: He is doing well after recent NSTEMI tx with DES to S-PDA, DES to LAD and POBA to dist LAD. He remains on DAP therapy. He denies angina. Continue ASA, Plavix, beta blocker and statin.  He continues in cardiac rehab. 2. Essential hypertension: Controlled. Continue ACE I/Dyazide and BB 3. Hyperlipemia: Lipitor increased to 80 QD during hospital stay. Will get Lipids and LFTs today since he is fasting 4. Occlusion and stenosis of carotid artery with cerebral infarction-Left: Follow up with [facility].       5.   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   Followup with me in 6 months   Signed, Armanda Magic, MD 12/28/2013 8:51 AM

## 2013-12-29 ENCOUNTER — Other Ambulatory Visit: Payer: Self-pay | Admitting: General Surgery

## 2013-12-29 DIAGNOSIS — E785 Hyperlipidemia, unspecified: Secondary | ICD-10-CM

## 2013-12-29 LAB — HEPATIC FUNCTION PANEL
ALBUMIN: 4 g/dL (ref 3.5–5.2)
ALK PHOS: 96 U/L (ref 39–117)
ALT: 38 U/L (ref 0–53)
AST: 26 U/L (ref 0–37)
Bilirubin, Direct: 0 mg/dL (ref 0.0–0.3)
TOTAL PROTEIN: 7.8 g/dL (ref 6.0–8.3)
Total Bilirubin: 0.6 mg/dL (ref 0.2–1.2)

## 2013-12-29 LAB — LIPID PANEL
Cholesterol: 131 mg/dL (ref 0–200)
HDL: 34 mg/dL — AB (ref >39.00)
LDL CALC: 64 mg/dL (ref 0–99)
NonHDL: 97
TRIGLYCERIDES: 167 mg/dL — AB (ref 0.0–149.0)
Total CHOL/HDL Ratio: 4
VLDL: 33.4 mg/dL (ref 0.0–40.0)

## 2013-12-30 ENCOUNTER — Encounter (HOSPITAL_COMMUNITY)
Admission: RE | Admit: 2013-12-30 | Discharge: 2013-12-30 | Disposition: A | Discharge status: Home / Self Care | Payer: BC Managed Care – PPO | Source: Ambulatory Visit | Attending: Cardiology | Admitting: Cardiology

## 2013-12-30 DIAGNOSIS — I214 Non-ST elevation (NSTEMI) myocardial infarction: Secondary | ICD-10-CM | POA: Diagnosis not present

## 2014-01-02 ENCOUNTER — Encounter (HOSPITAL_COMMUNITY): Payer: BC Managed Care – PPO

## 2014-01-04 ENCOUNTER — Encounter (HOSPITAL_COMMUNITY): Admission: RE | Admit: 2014-01-04 | Payer: BC Managed Care – PPO | Source: Ambulatory Visit

## 2014-01-04 ENCOUNTER — Telehealth (HOSPITAL_COMMUNITY): Payer: Self-pay | Admitting: *Deleted

## 2014-01-06 ENCOUNTER — Encounter (HOSPITAL_COMMUNITY)
Admission: RE | Admit: 2014-01-06 | Discharge: 2014-01-06 | Disposition: A | Discharge status: Home / Self Care | Payer: BC Managed Care – PPO | Source: Ambulatory Visit | Attending: Cardiology | Admitting: Cardiology

## 2014-01-06 DIAGNOSIS — I214 Non-ST elevation (NSTEMI) myocardial infarction: Secondary | ICD-10-CM | POA: Diagnosis not present

## 2014-01-06 NOTE — Progress Notes (Signed)
Francis Brown has been out this week due to family issues.  Mr Wittler is going through a divorce.  Emotional support provided. Francis Brown says he has been talking with a therapist and plans to talk with them this afternoon. Will continue to monitor the patient throughout  the program.

## 2014-01-09 ENCOUNTER — Encounter (HOSPITAL_COMMUNITY)
Admission: RE | Admit: 2014-01-09 | Discharge: 2014-01-09 | Disposition: A | Discharge status: Home / Self Care | Payer: BC Managed Care – PPO | Source: Ambulatory Visit | Attending: Cardiology | Admitting: Cardiology

## 2014-01-09 DIAGNOSIS — I214 Non-ST elevation (NSTEMI) myocardial infarction: Secondary | ICD-10-CM | POA: Diagnosis not present

## 2014-01-09 NOTE — Progress Notes (Signed)
Francis Brown 63 y.o. male Nutrition Note Spoke with pt.  Nutrition Plan and Nutrition Survey goals reviewed with pt. Pt is following Step 2 of the Therapeutic Lifestyle Changes diet. Pt wants to lose wt. Pt has not been actively trying to lose wt. Wt loss tips reviewed. Pt is diabetic. No recent A1c noted. Pt reports his last A1c was 6.4. Pt checks fasting CBG's daily. CBG's reportedly in "the 120's." Pt did not recall recommended fasting CBG range; recommended CBG ranges reviewed with pt. Pt controls his DM through diet. This Clinical research associate went over Diabetes Education test results. Pt expressed understanding of the information reviewed. Pt aware of nutrition education classes offered.  Nutrition Diagnosis   Food-and nutrition-related knowledge deficit related to lack of exposure to information as related to diagnosis of: ? CVD ? DM   Obesity related to excessive energy intake as evidenced by a BMI of 33.5  Nutrition RX/ Estimated Daily Nutrition Needs for: wt loss  1700-2200 Kcal, 45-60 gm fat, 11-14 gm sat fat, 1.6-2.2 gm trans-fat, <1500 mg sodium, 250 gm CHO   Nutrition Intervention   Pt's individual nutrition plan reviewed with pt.   Benefits of adopting Therapeutic Lifestyle Changes discussed when Medficts reviewed.   Pt to attend the Portion Distortion class   Pt to attend the  ? Nutrition I class                     ? Nutrition II class        ? Diabetes Blitz class       ? Diabetes Q & A class   Continue client-centered nutrition education by RD, as part of interdisciplinary care. Goal(s)   Pt to identify food quantities necessary to achieve: ? wt loss to a goal wt of 214-232 lb (97.2-105.4 kg) at graduation from cardiac rehab.    CBG concentrations in the normal range or as close to normal as is safely possible. Monitor and Evaluate progress toward nutrition goal with team. Nutrition Risk: Change to Moderate Mickle Plumb, M.Ed, RD, LDN, CDE 01/09/2014 3:31 PM

## 2014-01-11 ENCOUNTER — Encounter (HOSPITAL_COMMUNITY): Payer: BC Managed Care – PPO

## 2014-01-13 ENCOUNTER — Encounter (HOSPITAL_COMMUNITY)
Admission: RE | Admit: 2014-01-13 | Discharge: 2014-01-13 | Disposition: A | Discharge status: Home / Self Care | Payer: BC Managed Care – PPO | Source: Ambulatory Visit | Attending: Cardiology | Admitting: Cardiology

## 2014-01-13 DIAGNOSIS — I214 Non-ST elevation (NSTEMI) myocardial infarction: Secondary | ICD-10-CM | POA: Insufficient documentation

## 2014-01-13 DIAGNOSIS — Z955 Presence of coronary angioplasty implant and graft: Secondary | ICD-10-CM | POA: Insufficient documentation

## 2014-01-16 ENCOUNTER — Encounter (HOSPITAL_COMMUNITY)
Admission: RE | Admit: 2014-01-16 | Discharge: 2014-01-16 | Disposition: A | Discharge status: Home / Self Care | Payer: BC Managed Care – PPO | Source: Ambulatory Visit | Attending: Cardiology | Admitting: Cardiology

## 2014-01-16 DIAGNOSIS — I214 Non-ST elevation (NSTEMI) myocardial infarction: Secondary | ICD-10-CM | POA: Diagnosis not present

## 2014-01-18 ENCOUNTER — Encounter (HOSPITAL_COMMUNITY): Payer: BC Managed Care – PPO

## 2014-01-20 ENCOUNTER — Encounter (HOSPITAL_COMMUNITY): Payer: BC Managed Care – PPO

## 2014-01-23 ENCOUNTER — Encounter (HOSPITAL_COMMUNITY)
Admission: RE | Admit: 2014-01-23 | Discharge: 2014-01-23 | Disposition: A | Discharge status: Home / Self Care | Payer: BC Managed Care – PPO | Source: Ambulatory Visit | Attending: Cardiology | Admitting: Cardiology

## 2014-01-23 DIAGNOSIS — I214 Non-ST elevation (NSTEMI) myocardial infarction: Secondary | ICD-10-CM | POA: Diagnosis not present

## 2014-01-25 ENCOUNTER — Encounter (HOSPITAL_COMMUNITY)
Admission: RE | Admit: 2014-01-25 | Discharge: 2014-01-25 | Disposition: A | Discharge status: Home / Self Care | Payer: BC Managed Care – PPO | Source: Ambulatory Visit | Attending: Cardiology | Admitting: Cardiology

## 2014-01-25 DIAGNOSIS — I214 Non-ST elevation (NSTEMI) myocardial infarction: Secondary | ICD-10-CM | POA: Diagnosis not present

## 2014-01-27 ENCOUNTER — Encounter (HOSPITAL_COMMUNITY)
Admission: RE | Admit: 2014-01-27 | Discharge: 2014-01-27 | Disposition: A | Discharge status: Home / Self Care | Payer: BC Managed Care – PPO | Source: Ambulatory Visit | Attending: Cardiology | Admitting: Cardiology

## 2014-01-27 DIAGNOSIS — I214 Non-ST elevation (NSTEMI) myocardial infarction: Secondary | ICD-10-CM | POA: Diagnosis not present

## 2014-01-30 ENCOUNTER — Encounter (HOSPITAL_COMMUNITY): Payer: BC Managed Care – PPO

## 2014-01-31 ENCOUNTER — Telehealth: Payer: Self-pay | Admitting: Cardiology

## 2014-01-31 NOTE — Telephone Encounter (Signed)
New message      Pt request to talk to Dr Mayford Knifeurner.  He stated it was a Personnel officerpersonal matter.

## 2014-02-01 ENCOUNTER — Encounter (HOSPITAL_COMMUNITY)
Admission: RE | Admit: 2014-02-01 | Discharge: 2014-02-01 | Disposition: A | Discharge status: Home / Self Care | Payer: BC Managed Care – PPO | Source: Ambulatory Visit | Attending: Cardiology | Admitting: Cardiology

## 2014-02-01 DIAGNOSIS — I214 Non-ST elevation (NSTEMI) myocardial infarction: Secondary | ICD-10-CM | POA: Diagnosis not present

## 2014-02-01 NOTE — Telephone Encounter (Signed)
Follow up     Pt is requesting to talk to Dr Mayford Knifeurner.  He would not tell me what he wanted

## 2014-02-01 NOTE — Telephone Encounter (Signed)
I spoke with the pt and made him aware that Dr Mayford Knifeurner will not be back in our office until next week.  The pt said that Dr Mayford Knifeurner wrote him a letter (12/01/13) and he had given this to his lawyer, his lawyer recommended that Dr Mayford Knifeurner make a change in the documentation.  The pt plans to bring this letter into the office with the lawyer's recommendation so that Dr Mayford Knifeurner can determine if this is a change that she can make in the documentation. I will [facility] this [facility] so that she will be aware that the pt will be bringing information into the office for Dr Norris Crossurner's review.

## 2014-02-03 ENCOUNTER — Encounter (HOSPITAL_COMMUNITY): Payer: BC Managed Care – PPO

## 2014-02-06 ENCOUNTER — Encounter (HOSPITAL_COMMUNITY)
Admission: RE | Admit: 2014-02-06 | Discharge: 2014-02-06 | Disposition: A | Discharge status: Home / Self Care | Payer: BC Managed Care – PPO | Source: Ambulatory Visit | Attending: Cardiology | Admitting: Cardiology

## 2014-02-06 DIAGNOSIS — I214 Non-ST elevation (NSTEMI) myocardial infarction: Secondary | ICD-10-CM | POA: Diagnosis not present

## 2014-02-08 ENCOUNTER — Encounter (HOSPITAL_COMMUNITY)
Admission: RE | Admit: 2014-02-08 | Discharge: 2014-02-08 | Disposition: A | Discharge status: Home / Self Care | Payer: BC Managed Care – PPO | Source: Ambulatory Visit | Attending: Cardiology | Admitting: Cardiology

## 2014-02-08 DIAGNOSIS — I214 Non-ST elevation (NSTEMI) myocardial infarction: Secondary | ICD-10-CM | POA: Diagnosis not present

## 2014-02-10 ENCOUNTER — Encounter (HOSPITAL_COMMUNITY)
Admission: RE | Admit: 2014-02-10 | Discharge: 2014-02-10 | Disposition: A | Discharge status: Home / Self Care | Payer: BC Managed Care – PPO | Source: Ambulatory Visit | Attending: Cardiology | Admitting: Cardiology

## 2014-02-10 DIAGNOSIS — I214 Non-ST elevation (NSTEMI) myocardial infarction: Secondary | ICD-10-CM | POA: Diagnosis not present

## 2014-02-13 ENCOUNTER — Encounter (HOSPITAL_COMMUNITY): Payer: BC Managed Care – PPO

## 2014-02-15 ENCOUNTER — Encounter (HOSPITAL_COMMUNITY): Payer: BC Managed Care – PPO | Attending: Cardiology

## 2014-02-15 DIAGNOSIS — I214 Non-ST elevation (NSTEMI) myocardial infarction: Secondary | ICD-10-CM | POA: Insufficient documentation

## 2014-02-15 DIAGNOSIS — Z955 Presence of coronary angioplasty implant and graft: Secondary | ICD-10-CM | POA: Diagnosis present

## 2014-02-16 ENCOUNTER — Telehealth: Payer: Self-pay | Admitting: Cardiology

## 2014-02-16 ENCOUNTER — Other Ambulatory Visit: Payer: Self-pay

## 2014-02-16 MED ORDER — TRIAMTERENE-HCTZ 37.5-25 MG PO CAPS: 1.0000 | ORAL_CAPSULE | Freq: Every day | ORAL | Status: DC

## 2014-02-16 NOTE — Telephone Encounter (Signed)
Paperwork dropped by the office for Dr. Mayford Knifeurner.  Dr. Mayford Knifeurner is not in the office today, but forms will be placed in her folder "to be signed."   Called patient to let him know paperwork has been received, but Dr. Mayford Knifeurner is not in today.  Informed patient I'd call him back once paperwork is done.

## 2014-02-16 NOTE — Telephone Encounter (Signed)
Walk In pt Form " Letter Dropped off" [facility]

## 2014-02-17 ENCOUNTER — Encounter (HOSPITAL_COMMUNITY): Payer: BC Managed Care – PPO

## 2014-02-20 ENCOUNTER — Encounter (HOSPITAL_COMMUNITY): Payer: BC Managed Care – PPO

## 2014-02-22 ENCOUNTER — Encounter (HOSPITAL_COMMUNITY): Payer: BC Managed Care – PPO

## 2014-02-24 ENCOUNTER — Encounter (HOSPITAL_COMMUNITY): Payer: BC Managed Care – PPO

## 2014-02-27 ENCOUNTER — Telehealth: Payer: Self-pay

## 2014-02-27 ENCOUNTER — Encounter (HOSPITAL_COMMUNITY): Payer: BC Managed Care – PPO

## 2014-02-27 NOTE — Telephone Encounter (Signed)
Re: patient wants Dr. Mayford Knifeurner to change a letter to state that "he has the ability to retire at [id number] and I would strongly recommend this lifestyle change." Dr. Mayford Knifeurner will not change the letter as she st there is no cardiac reason he can not work 40 hours without travel.   Left message to call back.

## 2014-03-01 ENCOUNTER — Encounter (HOSPITAL_COMMUNITY): Payer: BC Managed Care – PPO

## 2014-03-02 NOTE — Telephone Encounter (Signed)
Informed patient that Dr. Mayford Knifeurner will not change the letter. Instructed patient to call back if he has any questions. Patient agreeable.

## 2014-03-03 ENCOUNTER — Encounter (HOSPITAL_COMMUNITY): Payer: BC Managed Care – PPO

## 2014-03-06 ENCOUNTER — Encounter (HOSPITAL_COMMUNITY): Payer: BC Managed Care – PPO

## 2014-03-07 ENCOUNTER — Telehealth (HOSPITAL_COMMUNITY): Payer: Self-pay | Admitting: *Deleted

## 2014-03-08 ENCOUNTER — Encounter (HOSPITAL_COMMUNITY): Payer: BC Managed Care – PPO

## 2014-03-21 ENCOUNTER — Other Ambulatory Visit: Payer: Self-pay | Admitting: Cardiology

## 2014-03-23 ENCOUNTER — Encounter (HOSPITAL_COMMUNITY): Payer: Self-pay | Admitting: Interventional Cardiology

## 2014-04-19 ENCOUNTER — Encounter: Payer: Self-pay | Admitting: Cardiology

## 2014-05-22 ENCOUNTER — Other Ambulatory Visit: Payer: Self-pay | Admitting: Cardiology

## 2014-06-29 ENCOUNTER — Other Ambulatory Visit: Payer: BC Managed Care – PPO

## 2014-07-05 ENCOUNTER — Other Ambulatory Visit: Payer: Self-pay

## 2014-08-14 ENCOUNTER — Other Ambulatory Visit: Payer: Self-pay

## 2014-08-23 IMAGING — CT CT ANGIO NECK
1 of 10 series · 5 of 33 positions shown · IV contrast (omnipaque)
Comparison: MRI from 01/11/2013

CLINICAL DATA: Three of recent left cerebral infarcts, evaluate
for severe left ICA stenosis.

CT ANGIOGRAPHY NECK
TECHNIQUE: Multidetector CT imaging of the neck was performed
using the standard protocol during bolus administration of
intravenous contrast.  Multiplanar CT image reconstructions
including MIPs were obtained to evaluate the vascular anatomy.
Carotid stenosis measurements (when applicable) are obtained
utilizing NASCET criteria, using the distal internal carotid
diameter as the denominator.
Contrast: 50mL OMNIPAQUE IOHEXOL 350 MG/ML SOLN

[Series 8062: mpr, ax. 1x1, axial · axial · 0.43mm/px · z∈[+132,+308]mm · 5 of 264 slices shown]
[im 44/264  soft-tissue]
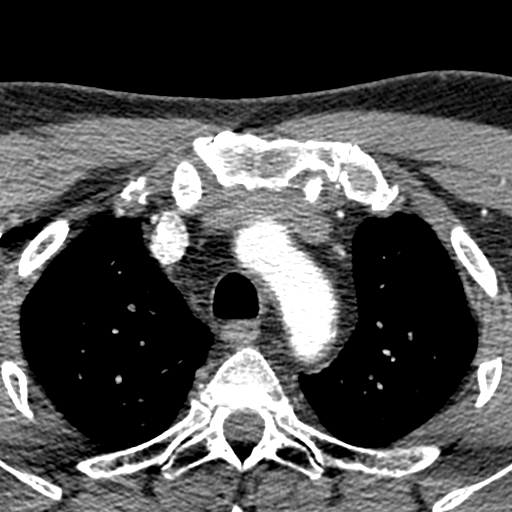
[im 88/264  bone]
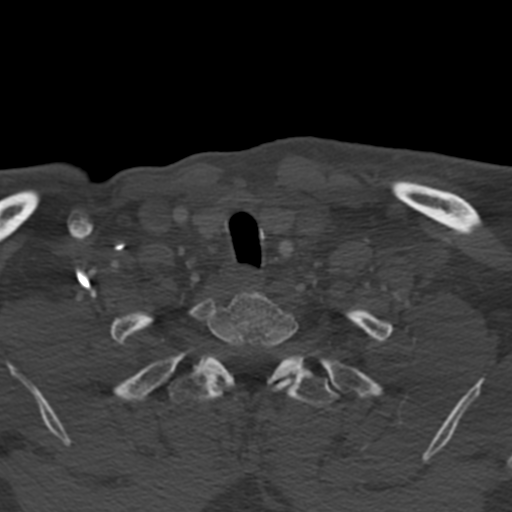
[im 132/264  soft-tissue]
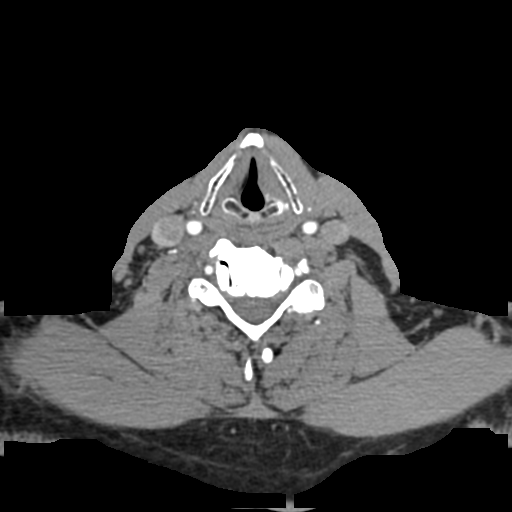
[im 176/264  bone]
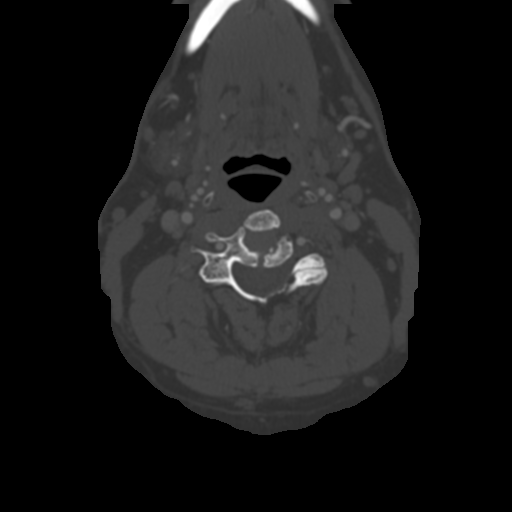
[im 220/264  soft-tissue]
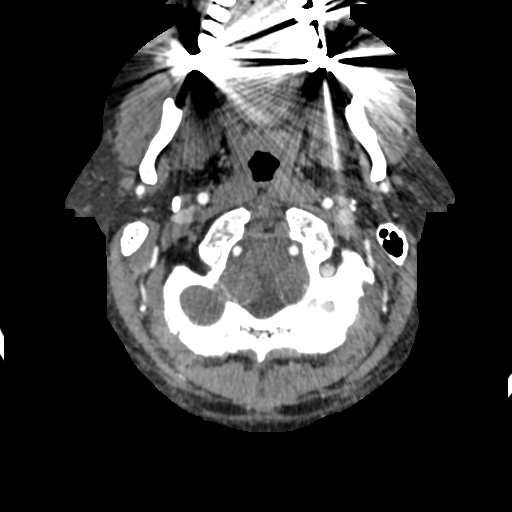

[5 of 33 positions shown; findings below may reference images not displayed]

FINDINGS: Pattern atherosclerotic plaque is noted within the
visualized aortic arch.  A bovine arch is noted with the left
common carotid and brachiocephalic arteries arising from the common
trunk. No high-grade stenosis is seen within the proximal great
vessels.

The common carotid arteries are measured in size and caliber
without high-grade stenosis.  Scattered calcified and noncalcified
atheromatous disease is seen about the carotid bifurcations.

A short segment high-grade stenosis of greater than 80% is seen
within the proximal left internal carotid artery, chest distal to
its origin from the carotid bifurcation.  This stenosis is best
seen on sagittal sequence (series 2730, image 102).  This stenosis
measures approximately 5 mm in length. Calcified and noncalcified
plaque is seen at this level.  There is suggestive of a small
amount of post stenotic dilatation along the the left internal
carotid artery just beyond the stenosis. Distally, of the left
internal carotid artery is well opacified without evidence of high-
grade stenosis, pseudoaneurysm, or other abnormality.  The left
internal carotid artery is seen to the level of the cavernous
segment.

On the right, there is mild narrowing of the proximal right
internal carotid artery its origin from the carotid bifurcation
(series 5, image 79).  The right internal carotid artery is
narrowed by approximately 40% at this level (series 5, image 80).
Calcified and noncalcified atheromatous disease is seen at this
level.  This stenosis measures approximately 1 cm length. Distally,
the right internal carotid artery is well opacified without
evidence of high-grade stenosis, pseudoaneurysm, or other
abnormality.

The external carotid arteries and their branches are well opacified
without evidence of high-grade stenosis or other abnormality.

The vertebral arteries are well opacified without evidence of high-
grade stenosis or dissection.  The vertebral arteries are fairly
symmetric in size.  No stenosis seen at the origin of either
vertebral artery.  The vertebrobasilar junction and visualized
basilar artery are within normal limits.

Review of the MIP images confirms the above findings.

The visualized soft tissues of the neck are within normal limits.
No mass lesion or loculated fluid collection.  No cervical
adenopathy.  The visualized thyroid gland is within normal limits.

Multilevel degenerative disc disease is seen within the visualized
cervical spine, most severe at this C5-6 level.  No acute osseous
abnormality identified.

The visualized lung apices are clear.
IMPRESSION: 1. Short segment high-grade stenosis of greater than 80% and
measuring 5 mm in length within the proximal left internal carotid
artery, just distal to its takeoff from the carotid bifurcation
(series 2730, image 102).  There is irregular calcified and
noncalcified atheromatous plaque at the level of the stenosis.  No
other high-grade stenosis identified within the left internal
carotid artery.
2.  Short segment stenosis of approximately 40% within the proximal
right internal carotid artery measuring 1 cm in length ( series 5,
image 79).
3.  No high-grade stenosis or other abnormality identified within
the vertebral arteries.

## 2014-08-25 ENCOUNTER — Other Ambulatory Visit (HOSPITAL_COMMUNITY): Payer: BC Managed Care – PPO

## 2014-08-25 ENCOUNTER — Ambulatory Visit: Payer: BC Managed Care – PPO | Admitting: Family

## 2014-08-29 ENCOUNTER — Ambulatory Visit: Payer: Self-pay | Admitting: Family

## 2014-09-15 ENCOUNTER — Encounter: Payer: Self-pay | Admitting: Family

## 2014-09-18 ENCOUNTER — Encounter (HOSPITAL_COMMUNITY): Payer: Self-pay

## 2014-09-18 ENCOUNTER — Ambulatory Visit: Payer: Self-pay | Admitting: Family

## 2014-10-03 ENCOUNTER — Encounter: Payer: Self-pay | Admitting: Family

## 2014-10-06 ENCOUNTER — Encounter (HOSPITAL_COMMUNITY): Payer: Self-pay

## 2014-10-06 ENCOUNTER — Ambulatory Visit: Payer: Self-pay | Admitting: Family

## 2014-10-11 ENCOUNTER — Encounter: Payer: Self-pay | Admitting: Family

## 2014-10-12 ENCOUNTER — Ambulatory Visit: Payer: Self-pay | Admitting: Family

## 2014-10-12 ENCOUNTER — Encounter (HOSPITAL_COMMUNITY): Payer: Self-pay

## 2014-11-30 ENCOUNTER — Encounter: Payer: Self-pay | Admitting: Family

## 2014-12-01 ENCOUNTER — Ambulatory Visit (HOSPITAL_COMMUNITY)
Admission: RE | Admit: 2014-12-01 | Discharge: 2014-12-01 | Disposition: A | Discharge status: Home / Self Care | Payer: BLUE CROSS/BLUE SHIELD | Source: Ambulatory Visit | Attending: Family | Admitting: Family

## 2014-12-01 ENCOUNTER — Encounter: Payer: Self-pay | Admitting: Family

## 2014-12-01 ENCOUNTER — Ambulatory Visit (INDEPENDENT_AMBULATORY_CARE_PROVIDER_SITE_OTHER): Payer: BLUE CROSS/BLUE SHIELD | Admitting: Family

## 2014-12-01 VITALS — BP 136/90 | HR 90 | Temp 97.0°F | Resp 16 | Ht 72.0 in | Wt 245.0 lb

## 2014-12-01 DIAGNOSIS — I6521 Occlusion and stenosis of right carotid artery: Secondary | ICD-10-CM | POA: Insufficient documentation

## 2014-12-01 DIAGNOSIS — Z9889 Other specified postprocedural states: Secondary | ICD-10-CM | POA: Diagnosis not present

## 2014-12-01 DIAGNOSIS — Z48812 Encounter for surgical aftercare following surgery on the circulatory system: Secondary | ICD-10-CM

## 2014-12-01 DIAGNOSIS — I6523 Occlusion and stenosis of bilateral carotid arteries: Secondary | ICD-10-CM

## 2014-12-01 DIAGNOSIS — Z87891 Personal history of nicotine dependence: Secondary | ICD-10-CM

## 2014-12-01 NOTE — Progress Notes (Signed)
Established Carotid Patient   History of Present Illness  Francis Brown is a 64 y.o. male patient of Dr. Imogene Brown who is s/p L CEA (Date: 01/18/13).  He had a stroke in 2005, TIA in 2012 and 2014 right before the left CEA, as manifested by right UE contracture an numbness, denies amaurosis fugax, denies unilateral facial drooping, denies aphasia. The 2005 stroke affected his left hand as numbness which has resolved.  He saw Dr. Pearlean Brown, neurology, in the past,  He had 3 MI's, 1996 with angioplasty, 1998 with 5 vessel CABG, 2010 with 2 stents.   Pt denies claudication symptoms with walking, denies non healing wounds.  The patient reports New Medical or Surgical History: A1C was 7.1 earlier in 2016, started metformin  Pt Diabetic: yes, diagnosed early 2016 Pt smoker: former smoker, quit in 2013  Pt meds include: Statin : yes ASA: yes Other anticoagulants/antiplatelets: Plavix   Past Medical History  Diagnosis Date  . Hypertension   . Coronary artery disease     a. 1996 s/p MI [place] Rx;  b. 1998 s/p MI in [place] with CABGx5 in [place];  b. 2010 s/p stenting to the OM in [place], [place] (Xience 4.0x12 DES, Xience 4.0x87mm DES)  . Erectile dysfunction   . Stroke     a. 2005;  b. 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  . Carotid disease, bilateral     a. 01/2013 Carotid U/S: RICA 1-39%, LICA 80-99%. Antegrade vertebral flow.  . Tobacco abuse   . Hyperlipidemia   . Bradycardia, drug induced   . CHF (congestive heart failure)   . Diabetes mellitus without complication 2015  . Myocardial infarction October 17, 2013    Heart Attack    Social History Social History  Substance Use Topics  . Smoking status: Former Smoker -- 0.80 packs/day for 30 years    Types: Cigarettes    Quit date: 08/20/2003  . Smokeless tobacco: Never Used  . Alcohol Use: Yes     Comment: occ    Family History Family History  Problem Relation Age of Onset  . CAD Father    died @ 61  . Heart disease Father     Before 5 years old  . Hyperlipidemia Father   . Heart attack Father   . Other Mother     died @ 35  . Heart disease Mother   . Hyperlipidemia Mother   . Varicose Veins Mother     Surgical History Past Surgical History  Procedure Laterality Date  . Cardiac surgery  07/29/1996  . Coronary stent placement    . Coronary artery bypass graft    . Coronary angioplasty    . Colonoscopy    . Endarterectomy Left 01/18/2013    Procedure: LEFT CAROTID ENDARTERECTOMY WITH PATCH ANGIOPLASTY;  Surgeon: [name], MD;  Location: [facility] OR;  Service: Vascular;  Laterality: Left;  . Carotid endarterectomy Left 01-18-13    cea  . Left heart cath Left September 27, 2013  . Percutaneous coronary stent intervention (pci-s)  September 29, 2013  . Left heart catheterization with coronary angiogram N/A 09/27/2013    Procedure: LEFT HEART CATHETERIZATION WITH CORONARY ANGIOGRAM;  Surgeon: Francis Noe, MD;  Location[facility] CATH LAB;  Service: Cardiovascular;  Laterality: N/A;  . Percutaneous coronary stent intervention (pci-s) N/A 09/29/2013    Procedure: PERCUTANEOUS CORONARY STENT INTERVENTION (PCI-S);  Surgeon: Francis M [name], MD;  Location: [facility] CATH LAB;  Service: Cardiovascular;  Laterality: N/A;    No Known Allergies  Current Outpatient Prescriptions  Medication Sig Dispense Refill  . aspirin EC 81 MG tablet Take 81 mg by mouth daily.    Francis Brown atorvastatin (LIPITOR) 80 MG tablet TAKE 1 TABLET (80 MG TOTAL) BY MOUTH DAILY. 30 tablet 5  . clopidogrel (PLAVIX) 75 MG tablet Take 1 tablet (75 mg total) by mouth daily. 30 tablet 6  . lisinopril (PRINIVIL,ZESTRIL) 10 MG tablet Take 10 mg by mouth daily.    . metFORMIN (GLUCOPHAGE) 500 MG tablet Take by mouth 2 (two) times daily with a meal.    . metoprolol tartrate (LOPRESSOR) 25 MG tablet TAKE 1 TABLET (25 MG TOTAL) BY MOUTH 2 (TWO) TIMES DAILY. 60 tablet 5  . nitroGLYCERIN (NITROSTAT) 0.4 MG SL tablet Place 1 tablet (0.4 mg  total) under the tongue every 5 (five) minutes x 3 doses as needed for chest pain. 25 tablet 2  . triamterene-hydrochlorothiazide (DYAZIDE) 37.5-25 MG per capsule Take 1 each (1 capsule total) by mouth daily. 30 capsule 6  . Ustekinumab (STELARA) 90 MG/ML SOSY Inject into the skin every 3 (three) months.    Francis Brown atorvastatin (LIPITOR) 80 MG tablet TAKE 1 TABLET (80 MG TOTAL) BY MOUTH DAILY. (Patient not taking: Reported on 12/01/2014) 30 tablet 5  . buPROPion (WELLBUTRIN XL) 300 MG 24 hr tablet Take 300 mg by mouth daily.    . hydrOXYzine (ATARAX/VISTARIL) 25 MG tablet Take 25 mg by mouth 2 (two) times daily.     . metoprolol tartrate (LOPRESSOR) 25 MG tablet TAKE 1 TABLET (25 MG TOTAL) BY MOUTH 2 (TWO) TIMES DAILY. (Patient not taking: Reported on 12/01/2014) 60 tablet 5  . Pyrithione Zinc (DERMAZINC SPRAY EX) Apply 1 application topically daily as needed (psoriasis).     . triamterene-hydrochlorothiazide (DYAZIDE) 37.5-25 MG per capsule TAKE 1 EACH (1 CAPSULE TOTAL) BY MOUTH DAILY. (Patient not taking: Reported on 12/01/2014) 30 capsule 3  . VIAGRA 100 MG tablet Take 100 mg by mouth as needed for erectile dysfunction.      No current facility-administered medications for this visit.    Review of Systems : See HPI for pertinent positives and negatives.  Physical Examination  Filed Vitals:   12/01/14 1115 12/01/14 1116  BP: 139/88 136/90  Pulse: 60 90  Temp: 97 F (36.1 C)   Resp: 16   Height: 6' (1.829 m)   Weight: 245 lb (111.131 kg)   SpO2: 98%    Body mass index is 33.22 kg/(m^2).  General: WDWN obese male in NAD GAIT: normal Eyes: PERRLA Pulmonary:  Non-labored, CTAB, Negative  Rales, Negative rhonchi, & Negative wheezing.  Cardiac: regular rhythm, no detected murmur.  VASCULAR EXAM Carotid Bruits Right Left   Negative Negative    Aorta is not palpable. Radial pulses are 2+ palpable and equal.  LE Pulses Right Left       POPLITEAL  not palpable   not palpable       POSTERIOR TIBIAL  not palpable   not palpable        DORSALIS PEDIS      ANTERIOR TIBIAL faintly palpable  faintly palpable     Gastrointestinal: soft, nontender, BS WNL, no r/g,  no palpable masses.  Musculoskeletal: no muscle atrophy/wasting. M/S 5/5 throughout, extremities without ischemic changes.  Neurologic: A&O X 3; Appropriate Affect, Speech is normal CN 2-12 intact, pain and light touch intact in extremities, Motor exam as listed above.   Non-Invasive Vascular Imaging (12/01/2014):  Carotid Duplex Less than 40% right internal carotid artery stenosis. Patent left carotid endarterectomy with no evidence for restenosis. No change from prior exam of 08/19/13.    Assessment: IZIK BINGMAN is a 64 y.o. male who who is s/p L CEA (Date: 01/18/13).  He had a stroke in 2005, TIA in 2012 and 2014 right before the left CEA, as manifested by right UE contracture an numbness, denies amaurosis fugax, denies unilateral facial drooping, denies aphasia. The 2005 stroke affected his left hand as numbness which has resolved.  He saw Dr. Pearlean Brown, neurology, in the past,  He had 3 MI's, 1996 with angioplasty, 1998 with 5 vessel CABG, 2010 with 2 stents. Today's carotid Duplex suggests less than 40% right internal carotid artery stenosis and a patent left carotid endarterectomy with no evidence for restenosis.  No change from prior exam of 08/19/13.  His atherosclerotic risk factors include DM, former smoker, CAD, and obesity.   Plan: Follow-up in 1 year with Carotid Duplex.   I discussed in depth with the patient the nature of atherosclerosis, and emphasized the importance of maximal medical management including strict control of blood pressure, blood glucose, and lipid levels, obtaining regular exercise, and continued cessation of smoking.  The patient is aware that without  maximal medical management the underlying atherosclerotic disease process will progress, limiting the benefit of any interventions. The patient was given information about stroke prevention and what symptoms should prompt the patient to seek immediate medical care. Thank you for allowing [place] to participate in this patient's care.  Charisse March, RN, MSN, FNP-C [facility] Office: [phone]  Clinic Physician: Francis Brown  12/01/2014 11:35 AM

## 2014-12-01 NOTE — Addendum Note (Signed)
Addended by: Sharee Pimple on: 12/01/2014 05:10 PM   Modules accepted: Orders

## 2014-12-01 NOTE — Patient Instructions (Signed)
Stroke Prevention Some medical conditions and behaviors are associated with an increased chance of having a stroke. You may prevent a stroke by making healthy choices and managing medical conditions. HOW CAN I REDUCE MY RISK OF HAVING A STROKE?   Stay physically active. Get at least 30 minutes of activity on most or all days.  Do not smoke. It may also be helpful to avoid exposure to secondhand smoke.  Limit alcohol use. Moderate alcohol use is considered to be:  No more than 2 drinks per day for men.  No more than 1 drink per day for nonpregnant women.  Eat healthy foods. This involves:  Eating 5 or more servings of fruits and vegetables a day.  Making dietary changes that address high blood pressure (hypertension), high cholesterol, diabetes, or obesity.  Manage your cholesterol levels.  Making food choices that are high in fiber and low in saturated fat, trans fat, and cholesterol may control cholesterol levels.  Take any prescribed medicines to control cholesterol as directed by your health care provider.  Manage your diabetes.  Controlling your carbohydrate and sugar intake is recommended to manage diabetes.  Take any prescribed medicines to control diabetes as directed by your health care provider.  Control your hypertension.  Making food choices that are low in salt (sodium), saturated fat, trans fat, and cholesterol is recommended to manage hypertension.  Take any prescribed medicines to control hypertension as directed by your health care provider.  Maintain a healthy weight.  Reducing calorie intake and making food choices that are low in sodium, saturated fat, trans fat, and cholesterol are recommended to manage weight.  Stop drug abuse.  Avoid taking birth control pills.  Talk to your health care provider about the risks of taking birth control pills if you are over 35 years old, smoke, get migraines, or have ever had a blood clot.  Get evaluated for sleep  disorders (sleep apnea).  Talk to your health care provider about getting a sleep evaluation if you snore a lot or have excessive sleepiness.  Take medicines only as directed by your health care provider.  For some people, aspirin or blood thinners (anticoagulants) are helpful in reducing the risk of forming abnormal blood clots that can lead to stroke. If you have the irregular heart rhythm of atrial fibrillation, you should be on a blood thinner unless there is a good reason you cannot take them.  Understand all your medicine instructions.  Make sure that other conditions (such as anemia or atherosclerosis) are addressed. SEEK IMMEDIATE MEDICAL CARE IF:   You have sudden weakness or numbness of the face, arm, or leg, especially on one side of the body.  Your face or eyelid droops to one side.  You have sudden confusion.  You have trouble speaking (aphasia) or understanding.  You have sudden trouble seeing in one or both eyes.  You have sudden trouble walking.  You have dizziness.  You have a loss of balance or coordination.  You have a sudden, severe headache with no known cause.  You have new chest pain or an irregular heartbeat. Any of these symptoms may represent a serious problem that is an emergency. Do not wait to see if the symptoms will go away. Get medical help at once. Call your local emergency services (911 in [place] Do not drive yourself to the hospital. Document Released: 05/08/2004 Document Revised: 08/15/2013 Document Reviewed: 10/01/2012 Exit[employer] Patient Information 2015 [employer]. This information is not intended to replace advice given   to you by your health care provider. Make sure you discuss any questions you have with your health care provider.  

## 2015-01-25 ENCOUNTER — Telehealth: Payer: Self-pay | Admitting: Cardiology

## 2015-01-25 NOTE — Telephone Encounter (Signed)
Left message to call back  

## 2015-01-25 NOTE — Telephone Encounter (Signed)
New Message      Pt calling stating that he needs to go out of town for a court date and he states he hasn't been feeling well and doesn't think he is up to traveling. Pt wants to know if he can get a note from Dr. Mayford Knifeurner. Please call back and advise.

## 2015-01-26 NOTE — Telephone Encounter (Signed)
Left message to call back  

## 2015-01-26 NOTE — Telephone Encounter (Signed)
Follow up   Left message to call back.

## 2015-01-26 NOTE — Telephone Encounter (Signed)
F/u ° ° ° °Pt returning Katy's call. °

## 2015-01-26 NOTE — Telephone Encounter (Signed)
Left message for patient that calls keep going straight to VM.  Instructed patient that if he calls before 1700 today, to ask the operator to put him straight through to me since his phone appears  To be off every time he is called.

## 2015-01-26 NOTE — Telephone Encounter (Signed)
Patient st he has "not felt well" for 2 weeks. He st he feels "down and out or that a cold may be coming on, but does not feel nearly sick enough for the emergency room." He also st it might be the weather change.  He st he does not feel well enough to get on a plane and go to another state for a court case. Informed the patient that he has not been seen in over a year, and Dr. Mayford Knifeurner would most likely not write an excuse for non-cardiac complaints.  The patient then said, "I just don't want to have the go to the ED to see a doctor." When questioned because he said he was NOT sick enough to go to the emergency room, he stated that "whenever he had heart attacks before he just felt 'off.'" Confirmed with patient he has no CP or SOB. Patient requested a OV.  OV scheduled with Tereso NewcomerScott Weaver Monday, 01/29/2015 for evaluation and excuse for court case.  Instructed patient to go to the ED immediately of CP or SOB occur.

## 2015-01-28 NOTE — Progress Notes (Signed)
Cardiology Office Note   Date:  01/29/2015   ID:  Francis Brown, DOB Aug 14, 1950, MRN [id number]  PCP:  [name], [name], MD  Cardiologist:  Dr. Armanda Magic   Electrophysiologist:  n/a  Chief Complaint  Patient presents with  . Other    states he is not feeling well  . Coronary Artery Disease     History of Present Illness: Francis Brown is a 64 y.o. male with a hx of CAD s/p CABG (MI in [place] >>> CABG in [place]) and prior DES to S-OM in 2010 [facility], [place]), prior CVA, carotid stenosis s/p L CEA, HTN, HL, DM2.   Admitted 6/15 with a non-STEMI. Patient was in acute renal failure upon presentation secondary to dehydration. Diuretic and ACE inhibitor were held and he was hydrated with IV fluids. Echocardiogram demonstrated reduced EF and new wall motion abnormality.  LHC demonstrated an occluded SVG-OM1, severe graft disease in the SVG-RV marginal/PDA and severe disease in the native LAD distal to the graft insertion. He underwent staged intervention with a DES to the SVG-PDA, DES to the mid LAD and balloon angioplasty to the distal LAD.   Last seen by Dr. Armanda Magic 9/15.  He called in last week complaining of not feeling well and was added to my schedule for evaluation. He has felt fatigued for the past 2 mos.  It seems to be getting worse.  He notes decreased exercise tolerance.  He denies chest pain.  He denies dyspnea. He does note L arm ache. It feels weak.  He does not feel like he can lift anything with that arm.   This is constant for the last month.  It is not related to exertion or positional changes.  He noted similar symptoms in his arm a few mos before his MI in 2015.  He denies orthopnea, PND, edema.  He denies syncope.  He has a chronic non-productive cough.  Of note, he started Metformin prior to these symptoms starting.       Studies/Reports Reviewed Today:  Carotid US 8/16 R < 40%; L patent CEA  Echo (09/27/13) EF 45-50%, inf-septal AK, Gr 2 DD  LHC 6/15 Left  mainstem: 50-70% distal left main  Left anterior descending (LAD): Occluded at the ostium.  Left circumflex (LCx): The first OM is occluded at the origin.  Right coronary artery (RCA): 100% occlusion proximally.  The SVG to the RV marginal and PDA is patent. In the proximal graft there is mild disease up to 20%. After the RV marginal insertion there is a 95% focal stenosis in the SVG. This is followed by a 40-50% stenosis in the distal SVG.  The SVG to OM1 is occluded proximally within a previously stented segment.  The SVG to a large diagonal branch is patent.  The LIMA to the LAD is patent. Following the graft insertion the native LAD has an eccentric 80-90% stenosis followed by a 70% stenosis distally.   PCI (09/29/13): 3.0 x 12 mm Xience Alpine stents x 2 to SVG to PDA  2.5 x 12 mm Xience Alpine stent to Mid LAD Distal LAD: 80% stenosis reduced to less than 30% with POBA   Past Medical History  Diagnosis Date  . Hypertension   . Coronary artery disease     a. 1996 s/p MI [facility];  b. 1998 s/p MI in [place] with CABGx5 in [place];  b. 2010 s/p stenting to the OM in [place] (Xience 4.0x12 DES, Xience  4.0x57mm DES)  . Erectile dysfunction   . Stroke [facility])     a. 2005;  b. 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  . Carotid disease, bilateral (HCC)     a. 01/2013 Carotid U/S: RICA 1-39%, LICA 80-99%. Antegrade vertebral flow.  . Tobacco abuse   . Hyperlipidemia   . Bradycardia, drug induced   . CHF (congestive heart failure) (HCC)   . Diabetes mellitus without complication (HCC) 2015  . Myocardial infarction [facility]) October 17, 2013    Heart Attack    Past Surgical History  Procedure Laterality Date  . Cardiac surgery  07/29/1996  . Coronary stent placement    . Coronary artery bypass graft    . Coronary angioplasty    . Colonoscopy    . Endarterectomy Left 01/18/2013    Procedure: LEFT CAROTID ENDARTERECTOMY WITH PATCH ANGIOPLASTY;   Surgeon: [name], MD;  [facility] OR;  Service: Vascular;  Laterality: Left;  . Carotid endarterectomy Left 01-18-13    cea  . Left heart cath Left September 27, 2013  . Percutaneous coronary stent intervention (pci-s)  September 29, 2013  . Left heart catheterization with coronary angiogram N/A 09/27/2013    Procedure: LEFT HEART CATHETERIZATION WITH CORONARY ANGIOGRAM;  Surgeon: [name], MD;  Location[facility] CATH LAB;  Service: Cardiovascular;  Laterality: N/A;  . Percutaneous coronary stent intervention (pci-s) N/A 09/29/2013    Procedure: PERCUTANEOUS CORONARY STENT INTERVENTION (PCI-S);  Surgeon: [name], MD;  Location: [facility] CATH LAB;  Service: Cardiovascular;  Laterality: N/A;     Current Outpatient Prescriptions  Medication Sig Dispense Refill  . aspirin EC 81 MG tablet Take 81 mg by mouth daily.    [name] atorvastatin (LIPITOR) 80 MG tablet TAKE 1 TABLET (80 MG TOTAL) BY MOUTH DAILY. 30 tablet 5  . buPROPion (WELLBUTRIN XL) 300 MG 24 hr tablet Take 300 mg by mouth daily.    . clopidogrel (PLAVIX) 75 MG tablet Take 1 tablet (75 mg total) by mouth daily. 30 tablet 6  . hydrOXYzine (ATARAX/VISTARIL) 25 MG tablet Take 25 mg by mouth 2 (two) times daily.     [name] lisinopril (PRINIVIL,ZESTRIL) 10 MG tablet Take 10 mg by mouth daily.    . metFORMIN (GLUCOPHAGE) 500 MG tablet Take by mouth 2 (two) times daily with a meal.    . metoprolol tartrate (LOPRESSOR) 25 MG tablet TAKE 1 TABLET (25 MG TOTAL) BY MOUTH 2 (TWO) TIMES DAILY. 60 tablet 5  . nitroGLYCERIN (NITROSTAT) 0.4 MG SL tablet Place 1 tablet (0.4 mg total) under the tongue every 5 (five) minutes x 3 doses as needed for chest pain. 25 tablet 2  . Pyrithione Zinc (DERMAZINC SPRAY EX) Apply 1 application topically daily as needed (psoriasis).     . triamterene-hydrochlorothiazide (DYAZIDE) 37.5-25 MG per capsule Take 1 each (1 capsule total) by mouth daily. 30 capsule 6  . Ustekinumab (STELARA) 90 MG/ML SOSY Inject into the skin every 3  (three) months.    [name] VIAGRA 100 MG tablet Take 100 mg by mouth as needed for erectile dysfunction.      No current facility-administered medications for this visit.    Allergies:   Review of patient's allergies indicates no known allergies.    Social History:  The patient  reports that he quit smoking about 11 years ago. His smoking use included Cigarettes. He has a 24 pack-year smoking history. He has never used smokeless tobacco. He reports that he  drinks alcohol. He reports that he does not use illicit drugs.   Family History:  The patient's family history includes CAD in his father; Heart attack in his father; Heart disease in his father and mother; Hyperlipidemia in his father and mother; Other in his mother; Varicose Veins in his mother.    ROS:   Please see the history of present illness.   Review of Systems  Constitution: Positive for malaise/fatigue.  HENT: Positive for headaches.   Psychiatric/Behavioral: The patient is nervous/anxious.   All other systems reviewed and are negative.     PHYSICAL EXAM: VS:  BP 130/80 mmHg  Pulse 68  Ht 5' 11.5" (1.816 m)  Wt 247 lb 6.4 oz (112.22 kg)  BMI 34.03 kg/m2    Wt Readings from Last 3 Encounters:  01/29/15 247 lb 6.4 oz (112.22 kg)  12/01/14 245 lb (111.131 kg)  12/28/13 241 lb 12.8 oz (109.68 kg)     GEN: Well nourished, well developed, in no acute distress HEENT: normal Neck: no JVD,  no masses Cardiac:  Normal S1/S2, RRR; no murmur ,  no rubs or gallops, no edema   Respiratory:  clear to auscultation bilaterally, no wheezing, rhonchi or rales. GI: soft, nontender, nondistended, + BS MS: no deformity or atrophy Skin: warm and dry  Neuro:  CNs II-XII intact, Strength and sensation are intact Psych: Normal affect   EKG:  EKG is ordered today.  It demonstrates:   NSR, HR 68, inf Q waves, QTc 401 ms, no change from prior tracing.    Recent Labs: No results found for requested labs within last 365 days.    Lipid  Panel    Component Value Date/Time   CHOL 131 12/28/2013 0914   TRIG 167.0* 12/28/2013 0914   HDL 34.00* 12/28/2013 0914   CHOLHDL 4 12/28/2013 0914   VLDL 33.4 12/28/2013 0914   LDLCALC 64 12/28/2013 0914      ASSESSMENT AND PLAN:  1. CAD:  S/p CABG and prior PCI to SVG.  Last PCI done in setting of NSTEMI 6/15 with DES to S-PDA, DES to LAD and POBA to distal LAD.  He notes symptoms of fatigue and decreased exercise tolerance over the past few mos. He also notes a L arm ache.  He had a similar ache in his arm months prior to his NSTEMI last year.  He has not had CP.  His ECG is unchanged.  Continue ASA, Plavix, statin, ACE inhibitor, beta-blocker.    -  Arrange ETT-Myoview to r/o ischemia.  2. Ischemic CM:  EF 45-50% by last echo.  Volume appears stable.  Continue ACE inhibitor, beta-blocker.   3. HTN:   Controlled   4. Hyperlipidemia:  Continue statin.  LDL was 64 in 9/15.    5. Carotid Stenosis:  S/p L CEA. Followed by [facility].  6. Diabetes:  Recent A1c 7.2.  Now on Metformin.   7. Fatigue:  Etiology not clear.  Proceed with myoview as noted.  Check BMET, CBC, TSH, LFTs today.  If myoview low risk, consider FU with PCP for further evaluation.      Medication Changes: Current medicines are reviewed at length with the patient today.  Concerns regarding medicines are as outlined above.  The following changes have been made:   Discontinued Medications   ATORVASTATIN (LIPITOR) 80 MG TABLET    TAKE 1 TABLET (80 MG TOTAL) BY MOUTH DAILY.   METOPROLOL TARTRATE (LOPRESSOR) 25 MG TABLET    TAKE 1 TABLET (25  MG TOTAL) BY MOUTH 2 (TWO) TIMES DAILY.   TRIAMTERENE-HYDROCHLOROTHIAZIDE (DYAZIDE) 37.5-25 MG PER CAPSULE    TAKE 1 EACH (1 CAPSULE TOTAL) BY MOUTH DAILY.   Modified Medications   No medications on file   New Prescriptions   No medications on file   Labs/ tests ordered today include:   Orders Placed This Encounter  Procedures  . Basic Metabolic Panel (BMET)  . CBC w/Diff  .  TSH  . Hepatic function panel  . Myocardial Perfusion Imaging  . EKG 12-Lead     Disposition:    FU with Dr. Armanda Magicraci Turner or me in 1 month.      Signed, Brynda RimScott Weaver, PA-C, MHS 01/29/2015 3:29 PM    [facility] [address], [place], [place]  [address] Phone: [phone]; Fax: [phone]

## 2015-01-29 ENCOUNTER — Ambulatory Visit (INDEPENDENT_AMBULATORY_CARE_PROVIDER_SITE_OTHER): Payer: BLUE CROSS/BLUE SHIELD | Admitting: Physician Assistant

## 2015-01-29 ENCOUNTER — Encounter: Payer: Self-pay | Admitting: Physician Assistant

## 2015-01-29 VITALS — BP 130/80 | HR 68 | Ht 71.5 in | Wt 247.4 lb

## 2015-01-29 DIAGNOSIS — I2510 Atherosclerotic heart disease of native coronary artery without angina pectoris: Secondary | ICD-10-CM | POA: Diagnosis not present

## 2015-01-29 DIAGNOSIS — I10 Essential (primary) hypertension: Secondary | ICD-10-CM | POA: Diagnosis not present

## 2015-01-29 DIAGNOSIS — E785 Hyperlipidemia, unspecified: Secondary | ICD-10-CM

## 2015-01-29 DIAGNOSIS — R6889 Other general symptoms and signs: Secondary | ICD-10-CM

## 2015-01-29 DIAGNOSIS — I255 Ischemic cardiomyopathy: Secondary | ICD-10-CM | POA: Diagnosis not present

## 2015-01-29 DIAGNOSIS — R5383 Other fatigue: Secondary | ICD-10-CM | POA: Diagnosis not present

## 2015-01-29 DIAGNOSIS — I6523 Occlusion and stenosis of bilateral carotid arteries: Secondary | ICD-10-CM

## 2015-01-29 LAB — CBC WITH DIFFERENTIAL/PLATELET
BASOS ABS: 0.1 10*3/uL (ref 0.0–0.1)
Basophils Relative: 1 % (ref 0–1)
EOS PCT: 2 % (ref 0–5)
Eosinophils Absolute: 0.1 10*3/uL (ref 0.0–0.7)
HEMATOCRIT: 44.8 % (ref 39.0–52.0)
Hemoglobin: 15.3 g/dL (ref 13.0–17.0)
LYMPHS PCT: 22 % (ref 12–46)
Lymphs Abs: 1.5 10*3/uL (ref 0.7–4.0)
MCH: 32.3 pg (ref 26.0–34.0)
MCHC: 34.2 g/dL (ref 30.0–36.0)
MCV: 94.7 fL (ref 78.0–100.0)
MPV: 11.2 fL (ref 8.6–12.4)
Monocytes Absolute: 0.6 10*3/uL (ref 0.1–1.0)
Monocytes Relative: 9 % (ref 3–12)
Neutro Abs: 4.5 10*3/uL (ref 1.7–7.7)
Neutrophils Relative %: 66 % (ref 43–77)
Platelets: 193 10*3/uL (ref 150–400)
RBC: 4.73 MIL/uL (ref 4.22–5.81)
RDW: 13.9 % (ref 11.5–15.5)
WBC: 6.8 10*3/uL (ref 4.0–10.5)

## 2015-01-29 LAB — HEPATIC FUNCTION PANEL
ALT: 66 U/L — AB (ref 9–46)
AST: 44 U/L — AB (ref 10–35)
Albumin: 3.9 g/dL (ref 3.6–5.1)
Alkaline Phosphatase: 71 U/L (ref 40–115)
BILIRUBIN DIRECT: 0.1 mg/dL (ref <=0.2)
BILIRUBIN INDIRECT: 0.3 mg/dL (ref 0.2–1.2)
BILIRUBIN TOTAL: 0.4 mg/dL (ref 0.2–1.2)
Total Protein: 7 g/dL (ref 6.1–8.1)

## 2015-01-29 LAB — BASIC METABOLIC PANEL
BUN: 13 mg/dL (ref 7–25)
CHLORIDE: 100 mmol/L (ref 98–110)
CO2: 22 mmol/L (ref 20–31)
Calcium: 8.8 mg/dL (ref 8.6–10.3)
Creat: 1.16 mg/dL (ref 0.70–1.25)
Glucose, Bld: 278 mg/dL — ABNORMAL HIGH (ref 65–99)
Potassium: 4.4 mmol/L (ref 3.5–5.3)
Sodium: 135 mmol/L (ref 135–146)

## 2015-01-29 LAB — TSH: TSH: 3.397 u[IU]/mL (ref 0.350–4.500)

## 2015-01-29 NOTE — Patient Instructions (Signed)
Medication Instructions:  1. Your physician recommends that you continue on your current medications as directed. Please refer to the Current Medication list given to you today.   Labwork: TODAY BMET, CBC W/DIFF, TSH, LFT  Testing/Procedures: Your physician has requested that you have en exercise stress myoview. For further information please visit [email or web address]. Please follow instruction sheet, as given.   Follow-Up: 1 MONTH SCOTT WEAVER, PAC SAME DAY DR. Mayford KnifeURNER IS IN THE OFFICE  Any Other Special Instructions Will Be Listed Below (If Applicable).

## 2015-01-29 NOTE — Telephone Encounter (Signed)
See OV note today Tereso NewcomerScott Josuel Koeppen, PA-C   01/29/2015 5:10 PM

## 2015-01-30 ENCOUNTER — Telehealth: Payer: Self-pay | Admitting: Physician Assistant

## 2015-01-30 ENCOUNTER — Encounter: Payer: Self-pay | Admitting: Physician Assistant

## 2015-01-30 NOTE — Telephone Encounter (Signed)
Pt has been notified of lab results and recommendations by phone with verbal  understanding. I also stated to pt that I saw message from earlier today that he needed a work note. Pt states he wants it to say that "he cannot travel until after stress test". He said he can work but "he didn't want to travel until test was done". I advised pt need to s/w PA. Bing NeighborsScott W. PA did work note per pt request. I will place letter at front desk, pt aware.

## 2015-01-30 NOTE — Telephone Encounter (Signed)
See phone note

## 2015-01-30 NOTE — Telephone Encounter (Signed)
New Message       Pt calling stating that he needs a doctors note for his most recent appt and future appt's. Please call back and advise.

## 2015-01-30 NOTE — Telephone Encounter (Signed)
I got this message today while I was in clinic with pt asking for a work note from yesterday visit and for future appts. Front desk called me and stated pt has walked in asking about the letter. I informed the front desk to please let pt know that I just saw his message and am in clinic and have not had time to s/w Bing NeighborsScott W. PA about letter yet and to please let pt know that I will call him later today.

## 2015-02-06 ENCOUNTER — Telehealth (HOSPITAL_COMMUNITY): Payer: Self-pay

## 2015-02-06 NOTE — Telephone Encounter (Signed)
Left message on voicemail in reference to upcoming appointment scheduled for 02-08-2015. Phone number given for a call back so details instructions can be given. Francis Brown A   

## 2015-02-06 NOTE — Telephone Encounter (Signed)
Patient given detailed instructions per Myocardial Perfusion Study Information Sheet for the test on 02/08/2015 at 9:45. Patient notified to arrive 15 minutes early and that it is imperative to arrive on time for appointment to keep from having the test rescheduled.  If you need to cancel or reschedule your appointment, please call the office within 24 hours of your appointment. Failure to do so may result in a cancellation of your appointment, and a $50 no show fee. Patient verbalized understanding.EHK

## 2015-02-08 ENCOUNTER — Ambulatory Visit (HOSPITAL_COMMUNITY): Payer: BLUE CROSS/BLUE SHIELD | Attending: Cardiovascular Disease

## 2015-02-08 DIAGNOSIS — I10 Essential (primary) hypertension: Secondary | ICD-10-CM | POA: Diagnosis not present

## 2015-02-08 DIAGNOSIS — I2510 Atherosclerotic heart disease of native coronary artery without angina pectoris: Secondary | ICD-10-CM | POA: Diagnosis not present

## 2015-02-08 DIAGNOSIS — R9439 Abnormal result of other cardiovascular function study: Secondary | ICD-10-CM | POA: Diagnosis not present

## 2015-02-08 DIAGNOSIS — R5383 Other fatigue: Secondary | ICD-10-CM | POA: Insufficient documentation

## 2015-02-08 DIAGNOSIS — I779 Disorder of arteries and arterioles, unspecified: Secondary | ICD-10-CM | POA: Diagnosis not present

## 2015-02-08 DIAGNOSIS — R6889 Other general symptoms and signs: Secondary | ICD-10-CM | POA: Insufficient documentation

## 2015-02-08 MED ORDER — TECHNETIUM TC 99M SESTAMIBI GENERIC - CARDIOLITE
10.6000 | Freq: Once | INTRAVENOUS | Status: AC | PRN
  Administered 2015-02-08: 11 via INTRAVENOUS

## 2015-02-08 MED ORDER — TECHNETIUM TC 99M SESTAMIBI GENERIC - CARDIOLITE
31.8000 | Freq: Once | INTRAVENOUS | Status: AC | PRN
  Administered 2015-02-08: 31.8 via INTRAVENOUS

## 2015-02-09 LAB — MYOCARDIAL PERFUSION IMAGING
CHL CUP MPHR: 158 {beats}/min
CHL CUP NUCLEAR SSS: 19
CHL CUP STRESS STAGE 1 DBP: 73 mmHg
CHL CUP STRESS STAGE 1 GRADE: 0 %
CHL CUP STRESS STAGE 1 HR: 75 {beats}/min
CHL CUP STRESS STAGE 1 SBP: 126 mmHg
CHL CUP STRESS STAGE 1 SPEED: 0 mph
CHL CUP STRESS STAGE 2 GRADE: 0 %
CHL CUP STRESS STAGE 2 HR: 75 {beats}/min
CHL CUP STRESS STAGE 2 SPEED: 0 mph
CHL CUP STRESS STAGE 3 DBP: 83 mmHg
CHL CUP STRESS STAGE 3 HR: 107 {beats}/min
CHL CUP STRESS STAGE 4 DBP: 83 mmHg
CHL CUP STRESS STAGE 4 GRADE: 12 %
CHL CUP STRESS STAGE 4 SPEED: 2.5 mph
CHL CUP STRESS STAGE 5 HR: 150 {beats}/min
CHL CUP STRESS STAGE 5 SBP: 185 mmHg
CHL CUP STRESS STAGE 5 SPEED: 3.4 mph
CHL CUP STRESS STAGE 6 GRADE: 14 %
CHL CUP STRESS STAGE 6 SPEED: 3.4 mph
CHL CUP STRESS STAGE 7 DBP: 80 mmHg
CHL CUP STRESS STAGE 7 GRADE: 0 %
CHL CUP STRESS STAGE 7 HR: 134 {beats}/min
CHL CUP STRESS STAGE 7 SBP: 182 mmHg
CHL CUP STRESS STAGE 8 DBP: 68 mmHg
CSEPHR: 95 %
CSEPPMHR: 94 %
Estimated workload: 10.1 METS
Exercise duration (min): 9 min
Exercise duration (sec): 0 s
LV dias vol: 92 mL
LVSYSVOL: 52 mL
Peak HR: 150 {beats}/min
RATE: 0.49
RPE: 18
Rest HR: 66 {beats}/min
SDS: 4
SRS: 15
Stage 3 Grade: 10 %
Stage 3 SBP: 146 mmHg
Stage 3 Speed: 1.7 mph
Stage 4 HR: 123 {beats}/min
Stage 4 SBP: 175 mmHg
Stage 5 DBP: 82 mmHg
Stage 5 Grade: 14 %
Stage 6 HR: 150 {beats}/min
Stage 7 Speed: 0 mph
Stage 8 Grade: 0 %
Stage 8 HR: 96 {beats}/min
Stage 8 SBP: 155 mmHg
Stage 8 Speed: 0 mph
TID: 0.86

## 2015-02-12 ENCOUNTER — Encounter: Payer: Self-pay | Admitting: Physician Assistant

## 2015-02-13 ENCOUNTER — Telehealth: Payer: Self-pay | Admitting: *Deleted

## 2015-02-13 NOTE — Telephone Encounter (Signed)
Pt has been notified of myoview results and findings. Pt is agreeable to f/u appt 11/9 w/Scott W. PA same day Dr. Mayford Knifeurner is in the office.

## 2015-02-15 ENCOUNTER — Telehealth: Payer: Self-pay | Admitting: Physician Assistant

## 2015-02-15 NOTE — Telephone Encounter (Signed)
New message     Pt request to talk to Scott---he has questions about the appt scheduled for next week

## 2015-02-15 NOTE — Telephone Encounter (Signed)
S/w pt who stated he wanted a little more information about the myoview results. He understands that we went over results 11/1 and I explained that he may need a cath, and that there is a defect in the area supplied by the bypass graft previousl stented. I asked if ok for Bing NeighborsScott W. PA tcb 11/4. Pt agreeable to this.

## 2015-02-16 NOTE — Telephone Encounter (Signed)
Reviewed stress test findings with patient by phone. All questions answered.  FU as planned. Tereso NewcomerScott Emilyrose Darrah, PA-C   02/16/2015 8:35 AM

## 2015-02-19 ENCOUNTER — Telehealth: Payer: Self-pay | Admitting: Physician Assistant

## 2015-02-19 ENCOUNTER — Encounter: Payer: Self-pay | Admitting: Physician Assistant

## 2015-02-19 NOTE — Telephone Encounter (Signed)
I s/w pt about needing letter. Pt states he needs letter to be detailed about his stress test results as to upcoming appt to discuss about needing cath. Pt needs letter to state cannot travel for a specific amount of time after cath. Pt appt 11/9 w/PA.

## 2015-02-19 NOTE — Telephone Encounter (Signed)
Done Tereso NewcomerScott Weaver, PA-C   02/19/2015 5:15 PM

## 2015-02-19 NOTE — Telephone Encounter (Signed)
New Message    Pt calling stating he wants to speak to Okey RegalCarol about getting a letter stating his heart condition. Please call back and advise.

## 2015-02-20 NOTE — Progress Notes (Signed)
 Cardiology Office Note   Date:  02/21/2015   ID:  Furman R Menna, DOB 12/29/1950, MRN [id number]  Patient Care Team: [name] Jordan, MD as PCP - General (Family Medicine) Traci R Turner, MD as Consulting Physician (Cardiology) Willey Due T Dinh Ayotte, PA-C as Physician Assistant (Cardiology)    Chief Complaint  Patient presents with  . Follow-up  . Coronary Artery Disease    Abnormal stress test     History of Present Illness: Francis Brown is a 64 y.o. male with a hx of CAD s/p CABG (MI in [place] >>> CABG in [place], [place]) and prior DES to S-OM in 2010 ([place], [place]), prior CVA, carotid stenosis s/p L CEA, HTN, HL, DM2.   Admitted 6/15 with a non-STEMI. Patient was in acute renal failure upon presentation secondary to dehydration. Diuretic and ACE inhibitor were held and he was hydrated with IV fluids. Echocardiogram demonstrated reduced EF and new wall motion abnormality.  LHC demonstrated an occluded SVG-OM1, severe graft disease in the SVG-RV marginal/PDA and severe disease in the native LAD distal to the graft insertion. He underwent staged intervention with a DES to the SVG-PDA, DES to the mid LAD and balloon angioplasty to the distal LAD.   I saw him 01/29/15 with complaints of not feeling well. He complained of fatigue for the past 2 as well as decreased exercise tolerance.  I set him up for stress testing. This demonstrated inferolateral and inferior infarct with peri-infarct ischemia. Results were reviewed with Dr. Turner suggested that we bring him in for cardiac catheterization. He returns for follow-up.  He remains fatigued.  Denies significant dyspnea.  Denies chest pain.  No orthopnea, PND, edema.  No syncope.     Studies/Reports Reviewed Today:  Myoview 02/08/15 EF 43%, inferolateral HK, inferolateral and inferior infarct with peri-infarct ischemia  Carotid US 8/16 R < 40%; L patent CEA  Echo (09/27/13) EF 45-50%, inf-septal AK, Gr 2 DD  LHC 6/15 Left mainstem: 50-70% distal  left main  Left anterior descending (LAD): Occluded at the ostium.  Left circumflex (LCx): The first OM is occluded at the origin.  Right coronary artery (RCA): 100% occlusion proximally.  The SVG to the RV marginal and PDA is patent. In the proximal graft there is mild disease up to 20%. After the RV marginal insertion there is a 95% focal stenosis in the SVG. This is followed by a 40-50% stenosis in the distal SVG.  The SVG to OM1 is occluded proximally within a previously stented segment.  The SVG to a large diagonal branch is patent.  The LIMA to the LAD is patent. Following the graft insertion the native LAD has an eccentric 80-90% stenosis followed by a 70% stenosis distally.   PCI (09/29/13): 3.0 x 12 mm Xience Alpine stents x 2 to SVG to PDA  2.5 x 12 mm Xience Alpine stent to Mid LAD Distal LAD: 80% stenosis reduced to less than 30% with POBA   Past Medical History  Diagnosis Date  . Hypertension   . Coronary artery disease     a. 1996 s/p MI [place];  b. 1998 s/p MI in [place] with CABGx5 in [place];  c. 2010 s/p stenting to the OM in [place], [place] (Xience 4.0x12 DES, Xience 4.0x8mm DES);  e. Myoview 10/16: EF 43%, inferolateral and inferior infarct with some peri-infarct ischemia, Intermediate Risk  . Erectile dysfunction   . Stroke (HCC)     a. 2005;  b. 01/2013 left frontotemporal embolic   cva in left cerebral artery territory.  . Carotid disease, bilateral (HCC)     a. 01/2013 Carotid U/S: RICA 1-39%, LICA 80-99%. Antegrade vertebral flow.  . Tobacco abuse   . Hyperlipidemia   . Bradycardia, drug induced   . CHF (congestive heart failure) (HCC)   . Diabetes mellitus without complication (HCC) 2015  . Myocardial infarction (HCC) October 17, 2013    Heart Attack    Past Surgical History  Procedure Laterality Date  . Cardiac surgery  07/29/1996  . Coronary stent placement    . Coronary artery bypass graft    . Coronary angioplasty    . Colonoscopy    . Endarterectomy  Left 01/18/2013    Procedure: LEFT CAROTID ENDARTERECTOMY WITH PATCH ANGIOPLASTY;  Surgeon: Brian L Chen, MD;  Location: [facility] OR;  Service: Vascular;  Laterality: Left;  . Carotid endarterectomy Left 01-18-13    cea  . Left heart cath Left September 27, 2013  . Percutaneous coronary stent intervention (pci-s)  September 29, 2013  . Left heart catheterization with coronary angiogram N/A 09/27/2013    Procedure: LEFT HEART CATHETERIZATION WITH CORONARY ANGIOGRAM;  Surgeon: Henry W Smith III, MD;  Location: [facility] CATH LAB;  Service: Cardiovascular;  Laterality: N/A;  . Percutaneous coronary stent intervention (pci-s) N/A 09/29/2013    Procedure: PERCUTANEOUS CORONARY STENT INTERVENTION (PCI-S);  Surgeon: Peter M Jordan, MD;  Location: [facility] CATH LAB;  Service: Cardiovascular;  Laterality: N/A;     Current Outpatient Prescriptions  Medication Sig Dispense Refill  . aspirin EC 81 MG tablet Take 81 mg by mouth daily.    . atorvastatin (LIPITOR) 80 MG tablet TAKE 1 TABLET (80 MG TOTAL) BY MOUTH DAILY. 30 tablet 5  . buPROPion (WELLBUTRIN XL) 300 MG 24 hr tablet Take 300 mg by mouth daily.    . clopidogrel (PLAVIX) 75 MG tablet Take 1 tablet (75 mg total) by mouth daily. 30 tablet 6  . hydrOXYzine (ATARAX/VISTARIL) 25 MG tablet Take 25 mg by mouth 2 (two) times daily.     . lisinopril (PRINIVIL,ZESTRIL) 10 MG tablet Take 10 mg by mouth daily.    . metFORMIN (GLUCOPHAGE) 500 MG tablet Take by mouth 2 (two) times daily with a meal.    . metoprolol tartrate (LOPRESSOR) 25 MG tablet TAKE 1 TABLET (25 MG TOTAL) BY MOUTH 2 (TWO) TIMES DAILY. 60 tablet 5  . nitroGLYCERIN (NITROSTAT) 0.4 MG SL tablet Place 1 tablet (0.4 mg total) under the tongue every 5 (five) minutes x 3 doses as needed for chest pain. 25 tablet 2  . Pyrithione Zinc (DERMAZINC SPRAY EX) Apply 1 application topically daily as needed (psoriasis).     . triamterene-hydrochlorothiazide (DYAZIDE) 37.5-25 MG per capsule Take 1 each (1 capsule total) by mouth daily.  30 capsule 6  . Ustekinumab (STELARA) 90 MG/ML SOSY Inject into the skin every 3 (three) months.    . VIAGRA 100 MG tablet Take 100 mg by mouth as needed for erectile dysfunction.      No current facility-administered medications for this visit.    Allergies:   Review of patient's allergies indicates no known allergies.    Social History:  The patient  reports that he quit smoking about 11 years ago. His smoking use included Cigarettes. He has a 24 pack-year smoking history. He has never used smokeless tobacco. He reports that he drinks alcohol. He reports that he does not use illicit drugs.   Family History:  The patient's family history includes CAD in   his father; Heart attack in his father; Heart disease in his father and mother; Hyperlipidemia in his father and mother; Other in his mother; Varicose Veins in his mother.    ROS:   Please see the history of present illness.   Review of Systems  All other systems reviewed and are negative.    PHYSICAL EXAM: VS:  BP 130/78 mmHg  Pulse 64  Ht 5' 11" (1.803 m)  Wt 248 lb 12.8 oz (112.855 kg)  BMI 34.72 kg/m2    Wt Readings from Last 3 Encounters:  02/21/15 248 lb 12.8 oz (112.855 kg)  02/08/15 247 lb (112.038 kg)  01/29/15 247 lb 6.4 oz (112.22 kg)     GEN: Well nourished, well developed, in no acute distress HEENT: normal Neck: no JVD,  no masses Cardiac:  Normal S1/S2, RRR; no murmur ,  no rubs or gallops, no edema   Respiratory:  clear to auscultation bilaterally, no wheezing, rhonchi or rales. GI: soft, nontender, nondistended, + BS MS: no deformity or atrophy Skin: warm and dry  Neuro:  CNs II-XII intact, Strength and sensation are intact Psych: Normal affect   EKG:  EKG is not ordered today.  It demonstrates:   n/a   Recent Labs: 01/29/2015: ALT 66*; BUN 13; Creat 1.16; Hemoglobin 15.3; Platelets 193; Potassium 4.4; Sodium 135; TSH 3.397    Lipid Panel    Component Value Date/Time   CHOL 131 12/28/2013 0914     TRIG 167.0* 12/28/2013 0914   HDL 34.00* 12/28/2013 0914   CHOLHDL 4 12/28/2013 0914   VLDL 33.4 12/28/2013 0914   LDLCALC 64 12/28/2013 0914      ASSESSMENT AND PLAN:  1. CAD:  S/p CABG and prior PCI to SVG.  Last PCI done in setting of NSTEMI 6/15 with DES to S-PDA, DES to LAD and POBA to distal LAD.  He recently presented with symptoms of fatigue and decreased exercise tolerance over the past few mos. Recent Myoview with inferior and inferolateral scar with peri-infarct ischemia. Therefore, we have recommended proceeding with cardiac cath.  Risks and benefits of cardiac catheterization have been discussed with the patient.  These include bleeding, infection, kidney damage, stroke, heart attack, death.  The patient understands these risks and is willing to proceed.   Continue ASA, Plavix, statin, ACE inhibitor, beta-blocker.     2. Ischemic CM:  EF 45-50% by last echo.  Volume appears stable.  Continue ACE inhibitor, beta-blocker.   3. HTN:  Controlled   4. Hyperlipidemia:  Continue statin.  LDL was 64 in 9/15.    5. Carotid Stenosis:  S/p L CEA. Followed by [facility].  6. Diabetes:  Recent A1c 7.2.  Now on Metformin. Hold Metformin for 24 hours before and 48 hours after LHC.     Medication Changes: Current medicines are reviewed at length with the patient today.  Concerns regarding medicines are as outlined above.  The following changes have been made:   Discontinued Medications   No medications on file   Modified Medications   No medications on file   New Prescriptions   No medications on file   Labs/ tests ordered today include:   Orders Placed This Encounter  Procedures  . Basic Metabolic Panel (BMET)  . CBC w/Diff  . INR/PT     Disposition:    FU with Dr. Traci Turner or me 2 weeks post cath.      Signed, Zemira Zehring, PA-C, MHS 02/21/2015 1:50 PM    [facility] [address], [place], [place]  [address] Phone: [phone]; Fax: [phone]    

## 2015-02-21 ENCOUNTER — Ambulatory Visit (INDEPENDENT_AMBULATORY_CARE_PROVIDER_SITE_OTHER): Payer: BLUE CROSS/BLUE SHIELD | Admitting: Physician Assistant

## 2015-02-21 ENCOUNTER — Encounter: Payer: Self-pay | Admitting: Physician Assistant

## 2015-02-21 ENCOUNTER — Encounter: Payer: Self-pay | Admitting: *Deleted

## 2015-02-21 VITALS — BP 130/78 | HR 64 | Ht 71.0 in | Wt 248.8 lb

## 2015-02-21 DIAGNOSIS — R5383 Other fatigue: Secondary | ICD-10-CM

## 2015-02-21 DIAGNOSIS — I2510 Atherosclerotic heart disease of native coronary artery without angina pectoris: Secondary | ICD-10-CM

## 2015-02-21 DIAGNOSIS — I255 Ischemic cardiomyopathy: Secondary | ICD-10-CM

## 2015-02-21 DIAGNOSIS — R9439 Abnormal result of other cardiovascular function study: Secondary | ICD-10-CM

## 2015-02-21 DIAGNOSIS — E785 Hyperlipidemia, unspecified: Secondary | ICD-10-CM

## 2015-02-21 DIAGNOSIS — I6523 Occlusion and stenosis of bilateral carotid arteries: Secondary | ICD-10-CM

## 2015-02-21 DIAGNOSIS — I10 Essential (primary) hypertension: Secondary | ICD-10-CM

## 2015-02-21 LAB — CBC WITH DIFFERENTIAL/PLATELET
BASOS PCT: 1 % (ref 0–1)
Basophils Absolute: 0.1 10*3/uL (ref 0.0–0.1)
EOS ABS: 0.2 10*3/uL (ref 0.0–0.7)
Eosinophils Relative: 2 % (ref 0–5)
HCT: 46.7 % (ref 39.0–52.0)
Hemoglobin: 16.1 g/dL (ref 13.0–17.0)
Lymphocytes Relative: 23 % (ref 12–46)
Lymphs Abs: 2 10*3/uL (ref 0.7–4.0)
MCH: 32.2 pg (ref 26.0–34.0)
MCHC: 34.5 g/dL (ref 30.0–36.0)
MCV: 93.4 fL (ref 78.0–100.0)
MONO ABS: 0.9 10*3/uL (ref 0.1–1.0)
MONOS PCT: 10 % (ref 3–12)
MPV: 10.7 fL (ref 8.6–12.4)
Neutro Abs: 5.5 10*3/uL (ref 1.7–7.7)
Neutrophils Relative %: 64 % (ref 43–77)
PLATELETS: 188 10*3/uL (ref 150–400)
RBC: 5 MIL/uL (ref 4.22–5.81)
RDW: 13.8 % (ref 11.5–15.5)
WBC: 8.6 10*3/uL (ref 4.0–10.5)

## 2015-02-21 NOTE — Patient Instructions (Signed)
Medication Instructions:  Your physician recommends that you continue on your current medications as directed. Please refer to the Current Medication list given to you today.   Labwork: TODAY BMET, CBC W/DIFF, PT/INR; PRE CATH LABS   Testing/Procedures: Your physician has requested that you have a cardiac catheterization. Cardiac catheterization is used to diagnose and/or treat various heart conditions. Doctors may recommend this procedure for a number of different reasons. The most common reason is to evaluate chest pain. Chest pain can be a symptom of coronary artery disease (CAD), and cardiac catheterization can show whether plaque is narrowing or blocking your heart's arteries. This procedure is also used to evaluate the valves, as well as measure the blood flow and oxygen levels in different parts of your heart. For further information please visit [email or web address] Please follow instruction sheet, as given.   Follow-Up: 03/14/15 AT 11:50 SCOTT WEAVER, PAC   Any Other Special Instructions Will Be Listed Below (If Applicable).   If you need a refill on your cardiac medications before your next appointment, please call your pharmacy.

## 2015-02-22 ENCOUNTER — Telehealth: Payer: Self-pay | Admitting: *Deleted

## 2015-02-22 LAB — PROTIME-INR
INR: 0.97 (ref <1.50)
PROTHROMBIN TIME: 13 s (ref 11.6–15.2)

## 2015-02-22 LAB — BASIC METABOLIC PANEL
BUN: 14 mg/dL (ref 7–25)
CALCIUM: 9.1 mg/dL (ref 8.6–10.3)
CO2: 21 mmol/L (ref 20–31)
CREATININE: 1.16 mg/dL (ref 0.70–1.25)
Chloride: 100 mmol/L (ref 98–110)
GLUCOSE: 224 mg/dL — AB (ref 65–99)
Potassium: 4.2 mmol/L (ref 3.5–5.3)
Sodium: 134 mmol/L — ABNORMAL LOW (ref 135–146)

## 2015-02-22 NOTE — Telephone Encounter (Signed)
Pt notified of lab results ok for cath. Pt verbalized understanding to recommendation f/u w/PCP about diabetes.

## 2015-02-22 NOTE — Telephone Encounter (Signed)
Lmptcb for lab results and recommendations 

## 2015-02-27 ENCOUNTER — Encounter (HOSPITAL_COMMUNITY)
Admission: RE | Disposition: A | Discharge status: Home / Self Care | Payer: Self-pay | Source: Ambulatory Visit | Attending: Cardiology

## 2015-02-27 ENCOUNTER — Ambulatory Visit (HOSPITAL_COMMUNITY)
Admission: RE | Admit: 2015-02-27 | Discharge: 2015-02-27 | Disposition: A | Discharge status: Home / Self Care | Payer: BLUE CROSS/BLUE SHIELD | Source: Ambulatory Visit | Attending: Cardiology | Admitting: Cardiology

## 2015-02-27 ENCOUNTER — Encounter (HOSPITAL_COMMUNITY): Payer: Self-pay | Admitting: Cardiology

## 2015-02-27 DIAGNOSIS — Z955 Presence of coronary angioplasty implant and graft: Secondary | ICD-10-CM | POA: Insufficient documentation

## 2015-02-27 DIAGNOSIS — Z87891 Personal history of nicotine dependence: Secondary | ICD-10-CM | POA: Insufficient documentation

## 2015-02-27 DIAGNOSIS — I2582 Chronic total occlusion of coronary artery: Secondary | ICD-10-CM | POA: Diagnosis not present

## 2015-02-27 DIAGNOSIS — R5383 Other fatigue: Secondary | ICD-10-CM | POA: Diagnosis present

## 2015-02-27 DIAGNOSIS — I6521 Occlusion and stenosis of right carotid artery: Secondary | ICD-10-CM | POA: Insufficient documentation

## 2015-02-27 DIAGNOSIS — E119 Type 2 diabetes mellitus without complications: Secondary | ICD-10-CM | POA: Insufficient documentation

## 2015-02-27 DIAGNOSIS — Z7902 Long term (current) use of antithrombotics/antiplatelets: Secondary | ICD-10-CM | POA: Diagnosis not present

## 2015-02-27 DIAGNOSIS — E785 Hyperlipidemia, unspecified: Secondary | ICD-10-CM | POA: Diagnosis not present

## 2015-02-27 DIAGNOSIS — Z8249 Family history of ischemic heart disease and other diseases of the circulatory system: Secondary | ICD-10-CM | POA: Insufficient documentation

## 2015-02-27 DIAGNOSIS — I2510 Atherosclerotic heart disease of native coronary artery without angina pectoris: Secondary | ICD-10-CM

## 2015-02-27 DIAGNOSIS — I25810 Atherosclerosis of coronary artery bypass graft(s) without angina pectoris: Secondary | ICD-10-CM | POA: Diagnosis not present

## 2015-02-27 DIAGNOSIS — Z7982 Long term (current) use of aspirin: Secondary | ICD-10-CM | POA: Diagnosis not present

## 2015-02-27 DIAGNOSIS — I110 Hypertensive heart disease with heart failure: Secondary | ICD-10-CM | POA: Insufficient documentation

## 2015-02-27 DIAGNOSIS — Z8673 Personal history of transient ischemic attack (TIA), and cerebral infarction without residual deficits: Secondary | ICD-10-CM | POA: Insufficient documentation

## 2015-02-27 DIAGNOSIS — I255 Ischemic cardiomyopathy: Secondary | ICD-10-CM | POA: Insufficient documentation

## 2015-02-27 DIAGNOSIS — I252 Old myocardial infarction: Secondary | ICD-10-CM | POA: Insufficient documentation

## 2015-02-27 DIAGNOSIS — I509 Heart failure, unspecified: Secondary | ICD-10-CM | POA: Insufficient documentation

## 2015-02-27 DIAGNOSIS — Z7984 Long term (current) use of oral hypoglycemic drugs: Secondary | ICD-10-CM | POA: Insufficient documentation

## 2015-02-27 DIAGNOSIS — R9439 Abnormal result of other cardiovascular function study: Secondary | ICD-10-CM | POA: Diagnosis present

## 2015-02-27 DIAGNOSIS — I10 Essential (primary) hypertension: Secondary | ICD-10-CM | POA: Diagnosis present

## 2015-02-27 HISTORY — PX: CARDIAC CATHETERIZATION: SHX172

## 2015-02-27 LAB — GLUCOSE, CAPILLARY: GLUCOSE-CAPILLARY: 206 mg/dL — AB (ref 65–99)

## 2015-02-27 SURGERY — LEFT HEART CATH AND CORS/GRAFTS ANGIOGRAPHY
Anesthesia: LOCAL

## 2015-02-27 MED ORDER — SODIUM CHLORIDE 0.9 % IJ SOLN: 3.0000 mL | Freq: Two times a day (BID) | INTRAMUSCULAR | Status: DC

## 2015-02-27 MED ORDER — SODIUM CHLORIDE 0.9 % IV SOLN: 250.0000 mL | INTRAVENOUS | Status: DC | PRN

## 2015-02-27 MED ORDER — FENTANYL CITRATE (PF) 100 MCG/2ML IJ SOLN
INTRAMUSCULAR | Status: DC | PRN
  Administered 2015-02-27: 25 ug via INTRAVENOUS

## 2015-02-27 MED ORDER — VERAPAMIL HCL 2.5 MG/ML IV SOLN
INTRAVENOUS | Status: AC
  Filled 2015-02-27: qty 2

## 2015-02-27 MED ORDER — MIDAZOLAM HCL 2 MG/2ML IJ SOLN
INTRAMUSCULAR | Status: AC
  Filled 2015-02-27: qty 4

## 2015-02-27 MED ORDER — SODIUM CHLORIDE 0.9 % IV SOLN
INTRAVENOUS | Status: DC
  Administered 2015-02-27: 07:00:00 via INTRAVENOUS

## 2015-02-27 MED ORDER — FENTANYL CITRATE (PF) 100 MCG/2ML IJ SOLN
INTRAMUSCULAR | Status: AC
  Filled 2015-02-27: qty 4

## 2015-02-27 MED ORDER — METFORMIN HCL 500 MG PO TABS
500.0000 mg | ORAL_TABLET | Freq: Two times a day (BID) | ORAL | Status: DC
  Filled 2015-02-27: qty 1

## 2015-02-27 MED ORDER — SODIUM CHLORIDE 0.9 % WEIGHT BASED INFUSION: 1.0000 mL/kg/h | INTRAVENOUS | Status: DC

## 2015-02-27 MED ORDER — ASPIRIN 81 MG PO CHEW: 81.0000 mg | CHEWABLE_TABLET | ORAL | Status: DC

## 2015-02-27 MED ORDER — HEPARIN (PORCINE) IN NACL 2-0.9 UNIT/ML-% IJ SOLN
INTRAMUSCULAR | Status: AC
  Filled 2015-02-27: qty 1000

## 2015-02-27 MED ORDER — SODIUM CHLORIDE 0.9 % IJ SOLN: 3.0000 mL | INTRAMUSCULAR | Status: DC | PRN

## 2015-02-27 MED ORDER — IOHEXOL 350 MG/ML SOLN
INTRAVENOUS | Status: DC | PRN
  Administered 2015-02-27: 150 mL via INTRA_ARTERIAL

## 2015-02-27 MED ORDER — MIDAZOLAM HCL 2 MG/2ML IJ SOLN
INTRAMUSCULAR | Status: DC | PRN
  Administered 2015-02-27: 2 mg via INTRAVENOUS

## 2015-02-27 MED ORDER — HEPARIN SODIUM (PORCINE) 1000 UNIT/ML IJ SOLN
INTRAMUSCULAR | Status: DC | PRN
  Administered 2015-02-27: 5500 [IU] via INTRAVENOUS

## 2015-02-27 MED ORDER — LIDOCAINE HCL (PF) 1 % IJ SOLN
INTRAMUSCULAR | Status: AC
  Filled 2015-02-27: qty 30

## 2015-02-27 MED ORDER — HEPARIN (PORCINE) IN NACL 2-0.9 UNIT/ML-% IJ SOLN
INTRAMUSCULAR | Status: AC
  Filled 2015-02-27: qty 500

## 2015-02-27 MED ORDER — HEPARIN SODIUM (PORCINE) 1000 UNIT/ML IJ SOLN
INTRAMUSCULAR | Status: AC
  Filled 2015-02-27: qty 1

## 2015-02-27 MED ORDER — LIDOCAINE HCL (PF) 1 % IJ SOLN
INTRAMUSCULAR | Status: DC | PRN
  Administered 2015-02-27: 09:00:00

## 2015-02-27 MED ORDER — VERAPAMIL HCL 2.5 MG/ML IV SOLN
INTRAVENOUS | Status: DC | PRN
  Administered 2015-02-27: 10 mL via INTRA_ARTERIAL

## 2015-02-27 SURGICAL SUPPLY — 14 items
CATH INFINITI 5 FR IM (CATHETERS) ×1 IMPLANT
CATH INFINITI 5 FR JL3.5 (CATHETERS) ×2 IMPLANT
CATH INFINITI 5 FR RCB (CATHETERS) ×1 IMPLANT
CATH INFINITI 5FR ANG PIGTAIL (CATHETERS) ×2 IMPLANT
CATH INFINITI 6F AL1 (CATHETERS) ×1 IMPLANT
CATH INFINITI JR4 5F (CATHETERS) ×2 IMPLANT
DEVICE RAD COMP TR BAND LRG (VASCULAR PRODUCTS) ×2 IMPLANT
GLIDESHEATH SLEND SS 6F .021 (SHEATH) ×2 IMPLANT
KIT HEART LEFT (KITS) ×2 IMPLANT
PACK CARDIAC CATHETERIZATION (CUSTOM PROCEDURE TRAY) ×2 IMPLANT
SYR MEDRAD MARK V 150ML (SYRINGE) ×2 IMPLANT
TRANSDUCER W/STOPCOCK (MISCELLANEOUS) ×2 IMPLANT
TUBING CIL FLEX 10 FLL-RA (TUBING) ×2 IMPLANT
WIRE SAFE-T 1.5MM-J .035X260CM (WIRE) ×2 IMPLANT

## 2015-02-27 NOTE — Discharge Instructions (Signed)
Radial Site Care °Refer to this sheet in the next few weeks. These instructions provide you with information about caring for yourself after your procedure. Your health care provider may also give you more specific instructions. Your treatment has been planned according to current medical practices, but problems sometimes occur. Call your health care provider if you have any problems or questions after your procedure. °WHAT TO EXPECT AFTER THE PROCEDURE °After your procedure, it is typical to have the following: °· Bruising at the radial site that usually fades within 1-2 weeks. °· Blood collecting in the tissue (hematoma) that may be painful to the touch. It should usually decrease in size and tenderness within 1-2 weeks. °HOME CARE INSTRUCTIONS °· Take medicines only as directed by your health care provider. °· You may shower 24-48 hours after the procedure or as directed by your health care provider. Remove the bandage (dressing) and gently wash the site with plain soap and water. Pat the area dry with a clean towel. Do not rub the site, because this may cause bleeding. °· Do not take baths, swim, or use a hot tub until your health care provider approves. °· Check your insertion site every day for redness, swelling, or drainage. °· Do not apply powder or lotion to the site. °· Do not flex or bend the affected arm for 24 hours or as directed by your health care provider. °· Do not push or pull heavy objects with the affected arm for 24 hours or as directed by your health care provider. °· Do not lift over 10 lb (4.5 kg) for 5 days after your procedure or as directed by your health care provider. °· Ask your health care provider when it is okay to: °¨ Return to work or school. °¨ Resume usual physical activities or sports. °¨ Resume sexual activity. °· Do not drive home if you are discharged the same day as the procedure. Have someone else drive you. °· You may drive 24 hours after the procedure unless otherwise  instructed by your health care provider. °· Do not operate machinery or power tools for 24 hours after the procedure. °· If your procedure was done as an outpatient procedure, which means that you went home the same day as your procedure, a responsible adult should be with you for the first 24 hours after you arrive home. °· Keep all follow-up visits as directed by your health care provider. This is important. °SEEK MEDICAL CARE IF: °· You have a fever. °· You have chills. °· You have increased bleeding from the radial site. Hold pressure on the site. °SEEK IMMEDIATE MEDICAL CARE IF: °· You have unusual pain at the radial site. °· You have redness, warmth, or swelling at the radial site. °· You have drainage (other than a small amount of blood on the dressing) from the radial site. °· The radial site is bleeding, and the bleeding does not stop after 30 minutes of holding steady pressure on the site. °· Your arm or hand becomes pale, cool, tingly, or numb. °  °This information is not intended to replace advice given to you by your health care provider. Make sure you discuss any questions you have with your health care provider. °  °Document Released: 05/03/2010 Document Revised: 04/21/2014 Document Reviewed: 10/17/2013 °Elsevier Interactive Patient Education ©2016 [employer] ° °

## 2015-02-27 NOTE — Interval H&P Note (Signed)
History and Physical Interval Note:  02/27/2015 7:43 AM  Francis BelliniJames R Brown  has presented today for surgery, with the diagnosis of abnormal stress test, CAD  The various methods of treatment have been discussed with the patient and family. After consideration of risks, benefits and other options for treatment, the patient has consented to  Procedure(s): Left Heart Cath and Cors/Grafts Angiography (N/A) as a surgical intervention .  The patient's history has been reviewed, patient examined, no change in status, stable for surgery.  I have reviewed the patient's chart and labs.  Questions were answered to the patient's satisfaction.   Cath Lab Visit (complete for each Cath Lab visit)  Clinical Evaluation Leading to the Procedure:   ACS: No.  Non-ACS:    Anginal Classification: CCS II  Anti-ischemic medical therapy: Minimal Therapy (1 class of medications)  Non-Invasive Test Results: Intermediate-risk stress test findings: cardiac mortality 1-3%/year  Prior CABG: Previous CABG        Theron Aristaeter Rehabilitation Hospital Of Southern New MexicoJordanMD,FACC 02/27/2015 7:43 AM

## 2015-02-27 NOTE — H&P (View-Only) (Signed)
Cardiology Office Note   Date:  02/21/2015   ID:  Francis Brown, DOB December 06, 1950, MRN [id number]  Patient Care Team: [name] [name], MD as PCP - General (Family Medicine) Quintella Reichert, MD as Consulting Physician (Cardiology) Beatrice Lecher, PA-C as Physician Assistant (Cardiology)    Chief Complaint  Patient presents with  . Follow-up  . Coronary Artery Disease    Abnormal stress test     History of Present Illness: Francis Brown is a 64 y.o. male with a hx of CAD s/p CABG (MI in [place] >>> CABG in [place], [place]) and prior DES to S-OM in 2010 [facility], [place]), prior CVA, carotid stenosis s/p L CEA, HTN, HL, DM2.   Admitted 6/15 with a non-STEMI. Patient was in acute renal failure upon presentation secondary to dehydration. Diuretic and ACE inhibitor were held and he was hydrated with IV fluids. Echocardiogram demonstrated reduced EF and new wall motion abnormality.  LHC demonstrated an occluded SVG-OM1, severe graft disease in the SVG-RV marginal/PDA and severe disease in the native LAD distal to the graft insertion. He underwent staged intervention with a DES to the SVG-PDA, DES to the mid LAD and balloon angioplasty to the distal LAD.   I saw him 01/29/15 with complaints of not feeling well. He complained of fatigue for the past 2 as well as decreased exercise tolerance.  I set him up for stress testing. This demonstrated inferolateral and inferior infarct with peri-infarct ischemia. Results were reviewed with Dr. Mayford Knife suggested that we bring him in for cardiac catheterization. He returns for follow-up.  He remains fatigued.  Denies significant dyspnea.  Denies chest pain.  No orthopnea, PND, edema.  No syncope.     Studies/Reports Reviewed Today:  Myoview 02/08/15 EF 43%, inferolateral HK, inferolateral and inferior infarct with peri-infarct ischemia  Carotid US 8/16 R < 40%; L patent CEA  Echo (09/27/13) EF 45-50%, inf-septal AK, Gr 2 DD  LHC 6/15 Left mainstem: 50-70% distal  left main  Left anterior descending (LAD): Occluded at the ostium.  Left circumflex (LCx): The first OM is occluded at the origin.  Right coronary artery (RCA): 100% occlusion proximally.  The SVG to the RV marginal and PDA is patent. In the proximal graft there is mild disease up to 20%. After the RV marginal insertion there is a 95% focal stenosis in the SVG. This is followed by a 40-50% stenosis in the distal SVG.  The SVG to OM1 is occluded proximally within a previously stented segment.  The SVG to a large diagonal branch is patent.  The LIMA to the LAD is patent. Following the graft insertion the native LAD has an eccentric 80-90% stenosis followed by a 70% stenosis distally.   PCI (09/29/13): 3.0 x 12 mm Xience Alpine stents x 2 to SVG to PDA  2.5 x 12 mm Xience Alpine stent to Mid LAD Distal LAD: 80% stenosis reduced to less than 30% with POBA   Past Medical History  Diagnosis Date  . Hypertension   . Coronary artery disease     a. 1996 s/p MI [place];  b. 1998 s/p MI in [place] with CABGx5 in [place];  c. 2010 s/p stenting to the OM in [place], [place] (Xience 4.0x12 DES, Xience 4.0x74mm DES);  e. Myoview 10/16: EF 43%, inferolateral and inferior infarct with some peri-infarct ischemia, Intermediate Risk  . Erectile dysfunction   . Stroke [facility])     a. 2005;  b. 01/2013 left frontotemporal embolic  cva in left cerebral artery territory.  . Carotid disease, bilateral (HCC)     a. 01/2013 Carotid U/S: RICA 1-39%, LICA 80-99%. Antegrade vertebral flow.  . Tobacco abuse   . Hyperlipidemia   . Bradycardia, drug induced   . CHF (congestive heart failure) (HCC)   . Diabetes mellitus without complication (HCC) 2015  . Myocardial infarction [facility]) October 17, 2013    Heart Attack    Past Surgical History  Procedure Laterality Date  . Cardiac surgery  07/29/1996  . Coronary stent placement    . Coronary artery bypass graft    . Coronary angioplasty    . Colonoscopy    . Endarterectomy  Left 01/18/2013    Procedure: LEFT CAROTID ENDARTERECTOMY WITH PATCH ANGIOPLASTY;  Surgeon: [name], MD;  Location: [facility] OR;  Service: Vascular;  Laterality: Left;  . Carotid endarterectomy Left 01-18-13    cea  . Left heart cath Left September 27, 2013  . Percutaneous coronary stent intervention (pci-s)  September 29, 2013  . Left heart catheterization with coronary angiogram N/A 09/27/2013    Procedure: LEFT HEART CATHETERIZATION WITH CORONARY ANGIOGRAM;  Surgeon: [name], MD;  Location[facility] CATH LAB;  Service: Cardiovascular;  Laterality: N/A;  . Percutaneous coronary stent intervention (pci-s) N/A 09/29/2013    Procedure: PERCUTANEOUS CORONARY STENT INTERVENTION (PCI-S);  Surgeon: [name], MD;  Location: [facility] CATH LAB;  Service: Cardiovascular;  Laterality: N/A;     Current Outpatient Prescriptions  Medication Sig Dispense Refill  . aspirin EC 81 MG tablet Take 81 mg by mouth daily.    [name] atorvastatin (LIPITOR) 80 MG tablet TAKE 1 TABLET (80 MG TOTAL) BY MOUTH DAILY. 30 tablet 5  . buPROPion (WELLBUTRIN XL) 300 MG 24 hr tablet Take 300 mg by mouth daily.    . clopidogrel (PLAVIX) 75 MG tablet Take 1 tablet (75 mg total) by mouth daily. 30 tablet 6  . hydrOXYzine (ATARAX/VISTARIL) 25 MG tablet Take 25 mg by mouth 2 (two) times daily.     [name] lisinopril (PRINIVIL,ZESTRIL) 10 MG tablet Take 10 mg by mouth daily.    . metFORMIN (GLUCOPHAGE) 500 MG tablet Take by mouth 2 (two) times daily with a meal.    . metoprolol tartrate (LOPRESSOR) 25 MG tablet TAKE 1 TABLET (25 MG TOTAL) BY MOUTH 2 (TWO) TIMES DAILY. 60 tablet 5  . nitroGLYCERIN (NITROSTAT) 0.4 MG SL tablet Place 1 tablet (0.4 mg total) under the tongue every 5 (five) minutes x 3 doses as needed for chest pain. 25 tablet 2  . Pyrithione Zinc (DERMAZINC SPRAY EX) Apply 1 application topically daily as needed (psoriasis).     . triamterene-hydrochlorothiazide (DYAZIDE) 37.5-25 MG per capsule Take 1 each (1 capsule total) by mouth daily.  30 capsule 6  . Ustekinumab (STELARA) 90 MG/ML SOSY Inject into the skin every 3 (three) months.    [name] VIAGRA 100 MG tablet Take 100 mg by mouth as needed for erectile dysfunction.      No current facility-administered medications for this visit.    Allergies:   Review of patient's allergies indicates no known allergies.    Social History:  The patient  reports that he quit smoking about 11 years ago. His smoking use included Cigarettes. He has a 24 pack-year smoking history. He has never used smokeless tobacco. He reports that he drinks alcohol. He reports that he does not use illicit drugs.   Family History:  The patient's family history includes CAD in  his father; Heart attack in his father; Heart disease in his father and mother; Hyperlipidemia in his father and mother; Other in his mother; Varicose Veins in his mother.    ROS:   Please see the history of present illness.   Review of Systems  All other systems reviewed and are negative.    PHYSICAL EXAM: VS:  BP 130/78 mmHg  Pulse 64  Ht  (1.803 m)  Wt 248 lb 12.8 oz (112.855 kg)  BMI 34.72 kg/m2    Wt Readings from Last 3 Encounters:  02/21/15 248 lb 12.8 oz (112.855 kg)  02/08/15 247 lb (112.038 kg)  01/29/15 247 lb 6.4 oz (112.22 kg)     GEN: Well nourished, well developed, in no acute distress HEENT: normal Neck: no JVD,  no masses Cardiac:  Normal S1/S2, RRR; no murmur ,  no rubs or gallops, no edema   Respiratory:  clear to auscultation bilaterally, no wheezing, rhonchi or rales. GI: soft, nontender, nondistended, + BS MS: no deformity or atrophy Skin: warm and dry  Neuro:  CNs II-XII intact, Strength and sensation are intact Psych: Normal affect   EKG:  EKG is not ordered today.  It demonstrates:   n/a   Recent Labs: 01/29/2015: ALT 66*; BUN 13; Creat 1.16; Hemoglobin 15.3; Platelets 193; Potassium 4.4; Sodium 135; TSH 3.397    Lipid Panel    Component Value Date/Time   CHOL 131 12/28/2013 0914     TRIG 167.0* 12/28/2013 0914   HDL 34.00* 12/28/2013 0914   CHOLHDL 4 12/28/2013 0914   VLDL 33.4 12/28/2013 0914   LDLCALC 64 12/28/2013 0914      ASSESSMENT AND PLAN:  1. CAD:  S/p CABG and prior PCI to SVG.  Last PCI done in setting of NSTEMI 6/15 with DES to S-PDA, DES to LAD and POBA to distal LAD.  He recently presented with symptoms of fatigue and decreased exercise tolerance over the past few mos. Recent Myoview with inferior and inferolateral scar with peri-infarct ischemia. Therefore, we have recommended proceeding with cardiac cath.  Risks and benefits of cardiac catheterization have been discussed with the patient.  These include bleeding, infection, kidney damage, stroke, heart attack, death.  The patient understands these risks and is willing to proceed.   Continue ASA, Plavix, statin, ACE inhibitor, beta-blocker.     2. Ischemic CM:  EF 45-50% by last echo.  Volume appears stable.  Continue ACE inhibitor, beta-blocker.   3. HTN:  Controlled   4. Hyperlipidemia:  Continue statin.  LDL was 64 in 9/15.    5. Carotid Stenosis:  S/p L CEA. Followed by [facility].  6. Diabetes:  Recent A1c 7.2.  Now on Metformin. Hold Metformin for 24 hours before and 48 hours after LHC.     Medication Changes: Current medicines are reviewed at length with the patient today.  Concerns regarding medicines are as outlined above.  The following changes have been made:   Discontinued Medications   No medications on file   Modified Medications   No medications on file   New Prescriptions   No medications on file   Labs/ tests ordered today include:   Orders Placed This Encounter  Procedures  . Basic Metabolic Panel (BMET)  . CBC w/Diff  . INR/PT     Disposition:    FU with Dr. Armanda Magic or me 2 weeks post cath.      Signed, Tereso Newcomer, PA-C, MHS 02/21/2015 1:50 PM    [facility] [address], [place], [place]  [address] Phone: [phone]; Fax: [phone])  [phone]

## 2015-03-01 ENCOUNTER — Ambulatory Visit: Payer: BLUE CROSS/BLUE SHIELD | Admitting: Physician Assistant

## 2015-03-02 ENCOUNTER — Ambulatory Visit: Payer: BLUE CROSS/BLUE SHIELD | Admitting: Physician Assistant

## 2015-03-13 NOTE — Progress Notes (Signed)
Cardiology Office Note   Date:  03/13/2015   ID:  Francis Brown, DOB October 02, 1950, MRN [id number]  Patient Care Team: Francis G [name], MD as PCP - General (Family Medicine) Francis Reichert, MD as Consulting Physician (Cardiology) Francis Lecher, PA-C as Physician Assistant (Cardiology)    Chief Complaint  Patient presents with  . Follow-up    s/p Cath  . Coronary Artery Disease     History of Present Illness: Francis Brown is a 64 y.o. male with a hx of CAD s/p CABG (MI in [place] >>> CABG in [place], [place]) and prior DES to S-OM in 2010 [facility], [place]), prior CVA, carotid stenosis s/p L CEA, HTN, HL, DM2.   Admitted 6/15 with a non-STEMI. Patient was in acute renal failure upon presentation secondary to dehydration. Diuretic and ACE inhibitor were held and he was hydrated with IV fluids. Echocardiogram demonstrated reduced EF and new wall motion abnormality.  LHC demonstrated an occluded SVG-OM1, severe graft disease in the SVG-RV marginal/PDA and severe disease in the native LAD distal to the graft insertion. He underwent staged intervention with a DES to the SVG-PDA, DES to the mid LAD and balloon angioplasty to the distal LAD.   I saw him 01/29/15 with complaints of not feeling well. He complained of fatigue for the past 2 as well as decreased exercise tolerance.  I set him up for stress testing. This demonstrated inferolateral and inferior infarct with peri-infarct ischemia. Results were reviewed with Francis Brown suggested that we bring him in for cardiac catheterization. He returns for follow-up.  He remains fatigued.  Denies significant dyspnea.  Denies chest pain.  No orthopnea, PND, edema.  No syncope.     Studies/Reports Reviewed Today:  LHC 02/27/15 LM:  Ostial 70% LAD: ostial 100%, distal stent ok, then 30% LCx: OM1 100% RCA:  Proximal 100% L-LAD: normal S-D1: normal S-RPDA: stents patent S-OM1: occluded Normal LVEF 1. Severe 3 vessel obstructive CAD 2. Patent LIMA to the LAD.  Continued patency of stent in mid LAD and PTCA site of distal LAD. 3. Patent SVG to the diagonal 4. Patent SVG to the PDA, stents are patent.  5. Chronic occlusion of the SVG to OM1 6. Good LV function.  Plan: Continue medical therapy. The distal left main stenosis is chronic and would not explain his recent symptoms of fatigue. All other areas are well revascularized.   Myoview 02/08/15 EF 43%, inferolateral HK, inferolateral and inferior infarct with peri-infarct ischemia  Carotid US 8/16 R < 40%; L patent CEA  Echo (09/27/13) EF 45-50%, inf-septal AK, Gr 2 DD  LHC 6/15 Left mainstem: 50-70% distal left main  Left anterior descending (LAD): Occluded at the ostium.  Left circumflex (LCx): The first OM is occluded at the origin.  Right coronary artery (RCA): 100% occlusion proximally.  The SVG to the RV marginal and PDA is patent. In the proximal graft there is mild disease up to 20%. After the RV marginal insertion there is a 95% focal stenosis in the SVG. This is followed by a 40-50% stenosis in the distal SVG.  The SVG to OM1 is occluded proximally within a previously stented segment.  The SVG to a large diagonal branch is patent.  The LIMA to the LAD is patent. Following the graft insertion the native LAD has an eccentric 80-90% stenosis followed by a 70% stenosis distally.   PCI (09/29/13): 3.0 x 12 mm Xience Alpine stents x 2 to SVG to PDA  2.5  x 12 mm Xience Alpine stent to Mid LAD Distal LAD: 80% stenosis reduced to less than 30% with POBA   Past Medical History  Diagnosis Date  . Hypertension   . Coronary artery disease     a. 1996 s/p MI [place];  b. 1998 s/p MI in [place] with CABGx5 in [place];  c. 2010 s/p stenting to the OM in [place], [place] (Xience 4.0x12 DES, Xience 4.0x658mm DES);  e. Myoview 10/16: EF 43%, inferolateral and inferior infarct with some peri-infarct ischemia, Intermediate Risk  . Erectile dysfunction   . Stroke [facility](HCC)     a. 2005;  b. 01/2013 left  frontotemporal embolic cva in left cerebral artery territory.  . Carotid disease, bilateral (HCC)     a. 01/2013 Carotid U/S: RICA 1-39%, LICA 80-99%. Antegrade vertebral flow.  . Tobacco abuse   . Hyperlipidemia   . Bradycardia, drug induced   . CHF (congestive heart failure) (HCC)   . Diabetes mellitus without complication (HCC) 2015  . Myocardial infarction [facility](HCC) October 17, 2013    Heart Attack    Past Surgical History  Procedure Laterality Date  . Cardiac surgery  07/29/1996  . Coronary stent placement    . Coronary artery bypass graft    . Coronary angioplasty    . Colonoscopy    . Endarterectomy Left 01/18/2013    Procedure: LEFT CAROTID ENDARTERECTOMY WITH PATCH ANGIOPLASTY;  Surgeon: Fransisco HertzBrian L Chen, MD;  [facility];  Service: Vascular;  Laterality: Left;  . Carotid endarterectomy Left 01-18-13    cea  . Left heart cath Left September 27, 2013  . Percutaneous coronary stent intervention (pci-s)  September 29, 2013  . Left heart catheterization with coronary angiogram N/A 09/27/2013    Procedure: LEFT HEART CATHETERIZATION WITH CORONARY ANGIOGRAM;  Surgeon: Lesleigh NoeHenry W Smith III, MD;  [facility];  Service: Cardiovascular;  Laterality: N/A;  . Percutaneous coronary stent intervention (pci-s) N/A 09/29/2013    Procedure: PERCUTANEOUS CORONARY STENT INTERVENTION (PCI-S);  Surgeon: Peter M SwazilandJordan, MD;  [facility];  Service: Cardiovascular;  Laterality: N/A;  . Cardiac catheterization N/A 02/27/2015    Procedure: Left Heart Cath and Cors/Grafts Angiography;  Surgeon: Peter M SwazilandJordan, MD;  [facility];  Service: Cardiovascular;  Laterality: N/A;     Current Outpatient Prescriptions  Medication Sig Dispense Refill  . aspirin EC 81 MG tablet Take 81 mg by mouth daily.    . clopidogrel (PLAVIX) 75 MG tablet Take 1 tablet (75 mg total) by mouth daily. 30 tablet 6  . lisinopril (PRINIVIL,ZESTRIL) 10 MG tablet Take 10 mg by mouth daily.    . metoprolol tartrate  (LOPRESSOR) 25 MG tablet TAKE 1 TABLET (25 MG TOTAL) BY MOUTH 2 (TWO) TIMES DAILY. 60 tablet 5  . nitroGLYCERIN (NITROSTAT) 0.4 MG SL tablet Place 1 tablet (0.4 mg total) under the tongue every 5 (five) minutes x 3 doses as needed for chest pain. 25 tablet 2  . triamterene-hydrochlorothiazide (DYAZIDE) 37.5-25 MG per capsule Take 1 each (1 capsule total) by mouth daily. 30 capsule 6  . Ustekinumab (STELARA) 90 MG/ML SOSY Inject 90 mg into the skin every 3 (three) months.     [name]. VIAGRA 100 MG tablet Take 100 mg by mouth as needed for erectile dysfunction.      No current facility-administered medications for this visit.    Allergies:   Review of patient's allergies indicates no known allergies.    Social History:  The patient  reports that he quit smoking about 11 years ago. His smoking use included Cigarettes. He has a 24 pack-year smoking history. He has never used smokeless tobacco. He reports that he drinks alcohol. He reports that he does not use illicit drugs.   Family History:  The patient's family history includes CAD in his father; Heart attack in his father; Heart disease in his father and mother; Hyperlipidemia in his father and mother; Other in his mother; Varicose Veins in his mother.    ROS:   Please see the history of present illness.   Review of Systems  All other systems reviewed and are negative.    PHYSICAL EXAM: VS:  There were no vitals taken for this visit.    Wt Readings from Last 3 Encounters:  02/27/15 248 lb (112.492 kg)  02/21/15 248 lb 12.8 oz (112.855 kg)  02/08/15 247 lb (112.038 kg)     GEN: Well nourished, well developed, in no acute distress HEENT: normal Neck: no JVD,  no masses Cardiac:  Normal S1/S2, RRR; no murmur ,  no rubs or gallops, no edema   Respiratory:  clear to auscultation bilaterally, no wheezing, rhonchi or rales. GI: soft, nontender, nondistended, + BS MS: no deformity or atrophy Skin: warm and dry  Neuro:  CNs II-XII intact,  Strength and sensation are intact Psych: Normal affect   EKG:  EKG is not ordered today.  It demonstrates:   n/a   Recent Labs: 01/29/2015: ALT 66*; TSH 3.397 02/21/2015: BUN 14; Creat 1.16; Hemoglobin 16.1; Platelets 188; Potassium 4.2; Sodium 134*    Lipid Panel    Component Value Date/Time   CHOL 131 12/28/2013 0914   TRIG 167.0* 12/28/2013 0914   HDL 34.00* 12/28/2013 0914   CHOLHDL 4 12/28/2013 0914   VLDL 33.4 12/28/2013 0914   LDLCALC 64 12/28/2013 0914      ASSESSMENT AND PLAN:  1. CAD:  S/p CABG and prior PCI to SVG.  Last PCI done in setting of NSTEMI 6/15 with DES to S-PDA, DES to LAD and POBA to distal LAD.  He recently presented with symptoms of fatigue and decreased exercise tolerance over the past few mos. Recent Myoview with inferior and inferolateral scar with peri-infarct ischemia. Therefore, we have recommended proceeding with cardiac cath.  Risks and benefits of cardiac catheterization have been discussed with the patient.  These include bleeding, infection, kidney damage, stroke, heart attack, death.  The patient understands these risks and is willing to proceed.   Continue ASA, Plavix, statin, ACE inhibitor, beta-blocker.     2. Ischemic CM:  EF 45-50% by last echo.  Volume appears stable.  Continue ACE inhibitor, beta-blocker.   3. HTN:  Controlled   4. Hyperlipidemia:  Continue statin.  LDL was 64 in 9/15.    5. Carotid Stenosis:  S/p L CEA. Followed by [facility].  6. Diabetes:  Recent A1c 7.2.  Now on Metformin. Hold Metformin for 24 hours before and 48 hours after LHC.     Medication Changes: Current medicines are reviewed at length with the patient today.  Concerns regarding medicines are as outlined above.  The following changes have been made:   Discontinued Medications   No medications on file   Modified Medications   No medications on file   New Prescriptions   No medications on file   Labs/ tests ordered today include:   No orders of the  defined types were placed in this encounter.  Disposition:    FU with Dr. Armanda Magic or me 2 weeks post cath.      Signed, Brynda Rim, MHS 03/13/2015 10:12 PM    [facility] [address] [place], [place], [place]  [address] Phone: [phone]; Fax: [phone]    This encounter was created in error - please disregard.

## 2015-03-14 ENCOUNTER — Encounter: Payer: BLUE CROSS/BLUE SHIELD | Admitting: Physician Assistant

## 2015-03-14 DIAGNOSIS — R0989 Other specified symptoms and signs involving the circulatory and respiratory systems: Secondary | ICD-10-CM

## 2015-03-21 ENCOUNTER — Encounter: Payer: Self-pay | Admitting: Physician Assistant

## 2015-07-20 ENCOUNTER — Other Ambulatory Visit: Payer: Self-pay | Admitting: Cardiology

## 2015-08-16 ENCOUNTER — Other Ambulatory Visit: Payer: Self-pay | Admitting: Cardiology

## 2015-08-18 ENCOUNTER — Encounter (HOSPITAL_COMMUNITY): Payer: Self-pay | Admitting: *Deleted

## 2015-08-18 ENCOUNTER — Emergency Department (HOSPITAL_COMMUNITY): Payer: BLUE CROSS/BLUE SHIELD

## 2015-08-18 ENCOUNTER — Observation Stay (HOSPITAL_COMMUNITY)
Admission: EM | Admit: 2015-08-18 | Discharge: 2015-08-19 | Disposition: A | Discharge status: Home / Self Care | Payer: BLUE CROSS/BLUE SHIELD | Attending: Internal Medicine | Admitting: Internal Medicine

## 2015-08-18 DIAGNOSIS — Z7982 Long term (current) use of aspirin: Secondary | ICD-10-CM | POA: Diagnosis not present

## 2015-08-18 DIAGNOSIS — I130 Hypertensive heart and chronic kidney disease with heart failure and stage 1 through stage 4 chronic kidney disease, or unspecified chronic kidney disease: Secondary | ICD-10-CM | POA: Insufficient documentation

## 2015-08-18 DIAGNOSIS — R0789 Other chest pain: Secondary | ICD-10-CM | POA: Diagnosis not present

## 2015-08-18 DIAGNOSIS — I10 Essential (primary) hypertension: Secondary | ICD-10-CM | POA: Diagnosis present

## 2015-08-18 DIAGNOSIS — Z8673 Personal history of transient ischemic attack (TIA), and cerebral infarction without residual deficits: Secondary | ICD-10-CM

## 2015-08-18 DIAGNOSIS — Z955 Presence of coronary angioplasty implant and graft: Secondary | ICD-10-CM | POA: Insufficient documentation

## 2015-08-18 DIAGNOSIS — E1122 Type 2 diabetes mellitus with diabetic chronic kidney disease: Secondary | ICD-10-CM | POA: Diagnosis not present

## 2015-08-18 DIAGNOSIS — N182 Chronic kidney disease, stage 2 (mild): Secondary | ICD-10-CM | POA: Diagnosis not present

## 2015-08-18 DIAGNOSIS — E785 Hyperlipidemia, unspecified: Secondary | ICD-10-CM | POA: Diagnosis present

## 2015-08-18 DIAGNOSIS — E119 Type 2 diabetes mellitus without complications: Secondary | ICD-10-CM

## 2015-08-18 DIAGNOSIS — Z7902 Long term (current) use of antithrombotics/antiplatelets: Secondary | ICD-10-CM | POA: Diagnosis not present

## 2015-08-18 DIAGNOSIS — Z7984 Long term (current) use of oral hypoglycemic drugs: Secondary | ICD-10-CM | POA: Insufficient documentation

## 2015-08-18 DIAGNOSIS — Z87891 Personal history of nicotine dependence: Secondary | ICD-10-CM | POA: Diagnosis not present

## 2015-08-18 DIAGNOSIS — I252 Old myocardial infarction: Secondary | ICD-10-CM | POA: Diagnosis not present

## 2015-08-18 DIAGNOSIS — I5042 Chronic combined systolic (congestive) and diastolic (congestive) heart failure: Secondary | ICD-10-CM | POA: Diagnosis present

## 2015-08-18 DIAGNOSIS — R110 Nausea: Secondary | ICD-10-CM | POA: Diagnosis present

## 2015-08-18 DIAGNOSIS — Z79899 Other long term (current) drug therapy: Secondary | ICD-10-CM | POA: Insufficient documentation

## 2015-08-18 DIAGNOSIS — I2510 Atherosclerotic heart disease of native coronary artery without angina pectoris: Secondary | ICD-10-CM | POA: Diagnosis not present

## 2015-08-18 DIAGNOSIS — Z8249 Family history of ischemic heart disease and other diseases of the circulatory system: Secondary | ICD-10-CM | POA: Diagnosis not present

## 2015-08-18 DIAGNOSIS — I119 Hypertensive heart disease without heart failure: Secondary | ICD-10-CM | POA: Diagnosis present

## 2015-08-18 HISTORY — DX: Chronic combined systolic (congestive) and diastolic (congestive) heart failure: I50.42

## 2015-08-18 HISTORY — DX: Personal history of nicotine dependence: Z87.891

## 2015-08-18 HISTORY — DX: Hypertensive heart disease without heart failure: I11.9

## 2015-08-18 HISTORY — DX: Chronic kidney disease, stage 2 (mild): N18.2

## 2015-08-18 LAB — CBC
HEMATOCRIT: 45.7 % (ref 39.0–52.0)
HEMOGLOBIN: 15.5 g/dL (ref 13.0–17.0)
MCH: 31.7 pg (ref 26.0–34.0)
MCHC: 33.9 g/dL (ref 30.0–36.0)
MCV: 93.5 fL (ref 78.0–100.0)
Platelets: 186 10*3/uL (ref 150–400)
RBC: 4.89 MIL/uL (ref 4.22–5.81)
RDW: 13.3 % (ref 11.5–15.5)
WBC: 9 10*3/uL (ref 4.0–10.5)

## 2015-08-18 LAB — BASIC METABOLIC PANEL
ANION GAP: 13 (ref 5–15)
BUN: 11 mg/dL (ref 6–20)
CO2: 26 mmol/L (ref 22–32)
Calcium: 9.4 mg/dL (ref 8.9–10.3)
Chloride: 95 mmol/L — ABNORMAL LOW (ref 101–111)
Creatinine, Ser: 1.36 mg/dL — ABNORMAL HIGH (ref 0.61–1.24)
GFR, EST NON AFRICAN AMERICAN: 54 mL/min — AB (ref >60)
Glucose, Bld: 277 mg/dL — ABNORMAL HIGH (ref 65–99)
POTASSIUM: 4 mmol/L (ref 3.5–5.1)
SODIUM: 134 mmol/L — AB (ref 135–145)

## 2015-08-18 LAB — I-STAT TROPONIN, ED: Troponin i, poc: 0.01 ng/mL (ref 0.00–0.08)

## 2015-08-18 MED ORDER — ASPIRIN 81 MG PO CHEW
162.0000 mg | CHEWABLE_TABLET | Freq: Once | ORAL | Status: AC
  Administered 2015-08-18: 162 mg via ORAL
  Filled 2015-08-18: qty 2

## 2015-08-18 MED ORDER — NITROGLYCERIN 0.4 MG SL SUBL
0.4000 mg | SUBLINGUAL_TABLET | SUBLINGUAL | Status: DC | PRN
  Administered 2015-08-18 (×2): 0.4 mg via SUBLINGUAL
  Filled 2015-08-18: qty 1

## 2015-08-18 NOTE — ED Provider Notes (Signed)
CSN: [id number]     Arrival date & time 08/18/15  2119 History   First MD Initiated Contact with Patient 08/18/15 2132     Chief Complaint  Patient presents with  . Chest Pain     (Consider location/radiation/quality/duration/timing/severity/associated sxs/prior Treatment) HPI Comments: Patient presents to the emergency department with chief complaint of chest discomfort. He has an extensive history of heart disease, has had CABG, and multiple stents. Denies any shortness of breath. States that the symptoms started about one hour ago had dinner. He does report associated nausea, but denies any vomiting. Denies any radiating pain. Denies any exertional symptoms. States that he has had multiple MIs the past, and has never had "chest pain," but has always had "strange discomfort."  The history is provided by the patient. No language interpreter was used.    Past Medical History  Diagnosis Date  . Hypertension   . Coronary artery disease     a. 1996 s/p MI [place];  b. 1998 s/p MI in [place] with CABGx5 in [place];  c. 2010 s/p stenting to the OM in [place], [place] (Xience 4.0x12 DES, Xience 4.0x438mm DES);  e. Myoview 10/16: EF 43%, inferolateral and inferior infarct with some peri-infarct ischemia, Intermediate Risk  . Erectile dysfunction   . Stroke [facility](HCC)     a. 2005;  b. 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  . Carotid disease, bilateral (HCC)     a. 01/2013 Carotid U/S: RICA 1-39%, LICA 80-99%. Antegrade vertebral flow.  . Tobacco abuse   . Hyperlipidemia   . Bradycardia, drug induced   . CHF (congestive heart failure) (HCC)   . Diabetes mellitus without complication (HCC) 2015  . Myocardial infarction [facility](HCC) October 17, 2013    Heart Attack   Past Surgical History  Procedure Laterality Date  . Cardiac surgery  07/29/1996  . Coronary stent placement    . Coronary artery bypass graft    . Coronary angioplasty    . Colonoscopy    . Endarterectomy Left  01/18/2013    Procedure: LEFT CAROTID ENDARTERECTOMY WITH PATCH ANGIOPLASTY;  Surgeon: Fransisco HertzBrian L Chen, MD;  [facility];  Service: Vascular;  Laterality: Left;  . Carotid endarterectomy Left 01-18-13    cea  . Left heart cath Left September 27, 2013  . Percutaneous coronary stent intervention (pci-s)  September 29, 2013  . Left heart catheterization with coronary angiogram N/A 09/27/2013    Procedure: LEFT HEART CATHETERIZATION WITH CORONARY ANGIOGRAM;  Surgeon: Lesleigh NoeHenry W Smith III, MD;  [facility];  Service: Cardiovascular;  Laterality: N/A;  . Percutaneous coronary stent intervention (pci-s) N/A 09/29/2013    Procedure: PERCUTANEOUS CORONARY STENT INTERVENTION (PCI-S);  Surgeon: Peter M SwazilandJordan, MD;  [facility];  Service: Cardiovascular;  Laterality: N/A;  . Cardiac catheterization N/A 02/27/2015    Procedure: Left Heart Cath and Cors/Grafts Angiography;  Surgeon: Peter M SwazilandJordan, MD;  [facility];  Service: Cardiovascular;  Laterality: N/A;   Family History  Problem Relation Age of Onset  . CAD Father     died @ 2865  . Heart disease Father     Before 63 years old  . Hyperlipidemia Father   . Heart attack Father   . Other Mother     died @ 8671  . Heart disease Mother   . Hyperlipidemia Mother   . Varicose Veins Mother    Social History  Substance Use Topics  . Smoking  status: Former Smoker -- 0.80 packs/day for 30 years    Types: Cigarettes    Quit date: 08/20/2003  . Smokeless tobacco: Never Used  . Alcohol Use: Yes     Comment: occ    Review of Systems  Constitutional: Negative for fever and chills.  Respiratory: Negative for shortness of breath.   Cardiovascular: Positive for chest pain.  Gastrointestinal: Negative for nausea, vomiting, diarrhea and constipation.  Genitourinary: Negative for dysuria.  All other systems reviewed and are negative.     Allergies  Review of patient's allergies indicates no known allergies.  Home Medications    Prior to Admission medications   Medication Sig Start Date End Date Taking? Authorizing Provider  aspirin EC 81 MG tablet Take 81 mg by mouth daily.    Historical Provider, MD  clopidogrel (PLAVIX) 75 MG tablet Take 1 tablet (75 mg total) by mouth daily. 01/13/13   Ok Anis, NP  lisinopril (PRINIVIL,ZESTRIL) 10 MG tablet Take 10 mg by mouth daily.    Historical Provider, MD  metoprolol tartrate (LOPRESSOR) 25 MG tablet TAKE 1 TABLET (25 MG TOTAL) BY MOUTH 2 (TWO) TIMES DAILY. 03/22/14   Quintella Reichert, MD  metoprolol tartrate (LOPRESSOR) 25 MG tablet TAKE 1 TABLET (25 MG TOTAL) BY MOUTH 2 (TWO) TIMES DAILY. 07/20/15   Quintella Reichert, MD  NITROSTAT 0.4 MG SL tablet PLACE 1 TABLET UNDER THE TOUNGE EVERY 5 MINS X 3 DOSES AS NEEDED FOR CHEST PAIN 08/17/15   Brittainy Sherlynn Carbon, PA-C  triamterene-hydrochlorothiazide (DYAZIDE) 37.5-25 MG capsule TAKE 1 EACH (1 CAPSULE TOTAL) BY MOUTH DAILY. 07/20/15   Quintella Reichert, MD  triamterene-hydrochlorothiazide (DYAZIDE) 37.5-25 MG per capsule Take 1 each (1 capsule total) by mouth daily. 02/16/14   Quintella Reichert, MD  Ustekinumab (STELARA) 90 MG/ML SOSY Inject 90 mg into the skin every 3 (three) months.     Historical Provider, MD  VIAGRA 100 MG tablet Take 100 mg by mouth as needed for erectile dysfunction.  07/08/13   Historical Provider, MD   BP 169/89 mmHg  Pulse 70  Temp(Src) 97.9 F (36.6 C) (Oral)  Resp 21  Ht  (1.778 m)  Wt 104.327 kg  BMI 33.00 kg/m2  SpO2 97% Physical Exam  Constitutional: He is oriented to person, place, and time. He appears well-developed and well-nourished.  HENT:  Head: Normocephalic and atraumatic.  Eyes: Conjunctivae and EOM are normal. Pupils are equal, round, and reactive to light. Right eye exhibits no discharge. Left eye exhibits no discharge. No scleral icterus.  Neck: Normal range of motion. Neck supple. No JVD present.  Cardiovascular: Normal rate, regular rhythm and normal heart sounds.  Exam reveals  no gallop and no friction rub.   No murmur heard. Pulmonary/Chest: Effort normal and breath sounds normal. No respiratory distress. He has no wheezes. He has no rales. He exhibits no tenderness.  Abdominal: Soft. He exhibits no distension and no mass. There is no tenderness. There is no rebound and no guarding.  Musculoskeletal: Normal range of motion. He exhibits no edema or tenderness.  Neurological: He is alert and oriented to person, place, and time.  Skin: Skin is warm and dry.  Psychiatric: He has a normal mood and affect. His behavior is normal. Judgment and thought content normal.  Nursing note and vitals reviewed.   ED Course  Procedures (including critical care time) Results for orders placed or performed during the hospital encounter of 08/18/15  Basic metabolic panel  Result  Value Ref Range   Sodium 134 (L) 135 - 145 mmol/L   Potassium 4.0 3.5 - 5.1 mmol/L   Chloride 95 (L) 101 - 111 mmol/L   CO2 26 22 - 32 mmol/L   Glucose, Bld 277 (H) 65 - 99 mg/dL   BUN 11 6 - 20 mg/dL   Creatinine, Ser 1.61 (H) 0.61 - 1.24 mg/dL   Calcium 9.4 8.9 - 09.6 mg/dL   GFR calc non Af Amer 54 (L) >60 mL/min   GFR calc Af Amer >60 >60 mL/min   Anion gap 13 5 - 15  CBC  Result Value Ref Range   WBC 9.0 4.0 - 10.5 K/uL   RBC 4.89 4.22 - 5.81 MIL/uL   Hemoglobin 15.5 13.0 - 17.0 g/dL   HCT 04.5 40.9 - 81.1 %   MCV 93.5 78.0 - 100.0 fL   MCH 31.7 26.0 - 34.0 pg   MCHC 33.9 30.0 - 36.0 g/dL   RDW 91.4 78.2 - 95.6 %   Platelets 186 150 - 400 K/uL  I-stat troponin, ED  Result Value Ref Range   Troponin i, poc 0.01 0.00 - 0.08 ng/mL   Comment 3           Dg Chest 2 View  08/18/2015  CLINICAL DATA:  Patient feels similar to previous heart attacks. Nausea and diarrhea. EXAM: CHEST  2 VIEW COMPARISON:  09/26/2013 FINDINGS: Postoperative changes in the mediastinum consistent with previous bypass surgery. Normal heart size and pulmonary vascularity. Calcified and tortuous aorta. Mediastinal  contours are intact. Slight interstitial pattern to the lungs likely representing fibrosis. No focal airspace disease or consolidation. No blunting of costophrenic angles. No pneumothorax. Degenerative changes in the spine. IMPRESSION: No active cardiopulmonary disease. Electronically Signed   By: Burman Nieves M.D.   On: 08/18/2015 22:18    I have personally reviewed and evaluated these images and lab results as part of my medical decision-making.   EKG Interpretation   Date/Time:  Saturday Aug 18 2015 21:25:36 EDT Ventricular Rate:  72 PR Interval:  190 QRS Duration: 84 QT Interval:  374 QTC Calculation: 409 R Axis:   -23 Text Interpretation:  Sinus rhythm with Premature supraventricular  complexes Poor data quality No significant change since last tracing  Confirmed by Denton Lank  MD, Caryn Bee ([id number]) on 08/18/2015 9:49:24 PM      MDM   Final diagnoses:  Chest discomfort    Patient with several prior MIs, CABG, presents with chest discomfort 1 hour. Will check labs, EKG, troponin. Patient will likely need to be admitted for observation even if initial workup is completely negative.   Patient seen by and discussed with Dr. Denton Lank, who agrees that patient should be observed in the hospital for chest pain rule out.  Symptoms started 2 hours ago.  Will consult hospitalist.  12:32 AM Appreciate Dr. Clyde Lundborg for admitting the patient.   Roxy Horseman, PA-C 08/19/15 0032  Cathren Laine, MD 08/19/15 [phone]

## 2015-08-18 NOTE — ED Notes (Signed)
Patient presents stating he does not feel right.  Stated he has had several heart attacks after his open heart in 1998 and never really had CP when they would happen just tremors

## 2015-08-18 NOTE — ED Notes (Signed)
Pt transported to Xray. 

## 2015-08-19 ENCOUNTER — Encounter (HOSPITAL_COMMUNITY): Payer: Self-pay | Admitting: Nurse Practitioner

## 2015-08-19 DIAGNOSIS — E785 Hyperlipidemia, unspecified: Secondary | ICD-10-CM

## 2015-08-19 DIAGNOSIS — I252 Old myocardial infarction: Secondary | ICD-10-CM

## 2015-08-19 DIAGNOSIS — I5042 Chronic combined systolic (congestive) and diastolic (congestive) heart failure: Secondary | ICD-10-CM | POA: Diagnosis present

## 2015-08-19 DIAGNOSIS — Z8673 Personal history of transient ischemic attack (TIA), and cerebral infarction without residual deficits: Secondary | ICD-10-CM | POA: Diagnosis not present

## 2015-08-19 DIAGNOSIS — I25810 Atherosclerosis of coronary artery bypass graft(s) without angina pectoris: Secondary | ICD-10-CM

## 2015-08-19 DIAGNOSIS — I2510 Atherosclerotic heart disease of native coronary artery without angina pectoris: Secondary | ICD-10-CM | POA: Diagnosis not present

## 2015-08-19 DIAGNOSIS — N182 Chronic kidney disease, stage 2 (mild): Secondary | ICD-10-CM | POA: Diagnosis not present

## 2015-08-19 DIAGNOSIS — E119 Type 2 diabetes mellitus without complications: Secondary | ICD-10-CM

## 2015-08-19 DIAGNOSIS — I119 Hypertensive heart disease without heart failure: Secondary | ICD-10-CM | POA: Diagnosis present

## 2015-08-19 DIAGNOSIS — Z955 Presence of coronary angioplasty implant and graft: Secondary | ICD-10-CM | POA: Diagnosis not present

## 2015-08-19 DIAGNOSIS — I10 Essential (primary) hypertension: Secondary | ICD-10-CM

## 2015-08-19 DIAGNOSIS — R0789 Other chest pain: Secondary | ICD-10-CM | POA: Diagnosis not present

## 2015-08-19 DIAGNOSIS — R110 Nausea: Secondary | ICD-10-CM | POA: Diagnosis not present

## 2015-08-19 LAB — BRAIN NATRIURETIC PEPTIDE: B Natriuretic Peptide: 73.5 pg/mL (ref 0.0–100.0)

## 2015-08-19 LAB — BASIC METABOLIC PANEL
ANION GAP: 10 (ref 5–15)
BUN: 9 mg/dL (ref 6–20)
CO2: 28 mmol/L (ref 22–32)
Calcium: 8.9 mg/dL (ref 8.9–10.3)
Chloride: 101 mmol/L (ref 101–111)
Creatinine, Ser: 1.22 mg/dL (ref 0.61–1.24)
GFR calc Af Amer: >60 mL/min (ref >60)
GLUCOSE: 159 mg/dL — AB (ref 65–99)
POTASSIUM: 3.7 mmol/L (ref 3.5–5.1)
Sodium: 139 mmol/L (ref 135–145)

## 2015-08-19 LAB — TROPONIN I
Troponin I: <0.03 ng/mL (ref <0.031)
Troponin I: <0.03 ng/mL (ref <0.031)

## 2015-08-19 LAB — LIPID PANEL
CHOL/HDL RATIO: 5.6 ratio
CHOLESTEROL: 200 mg/dL (ref 0–200)
HDL: 36 mg/dL — AB (ref >40)
LDL CALC: 127 mg/dL — AB (ref 0–99)
TRIGLYCERIDES: 185 mg/dL — AB (ref <150)
VLDL: 37 mg/dL (ref 0–40)

## 2015-08-19 LAB — RAPID URINE DRUG SCREEN, HOSP PERFORMED
AMPHETAMINES: NOT DETECTED
Barbiturates: NOT DETECTED
Benzodiazepines: NOT DETECTED
Cocaine: NOT DETECTED
OPIATES: NOT DETECTED
Tetrahydrocannabinol: NOT DETECTED

## 2015-08-19 LAB — CBC
HEMATOCRIT: 43.4 % (ref 39.0–52.0)
HEMOGLOBIN: 14.7 g/dL (ref 13.0–17.0)
MCH: 31.9 pg (ref 26.0–34.0)
MCHC: 33.9 g/dL (ref 30.0–36.0)
MCV: 94.1 fL (ref 78.0–100.0)
Platelets: 188 10*3/uL (ref 150–400)
RBC: 4.61 MIL/uL (ref 4.22–5.81)
RDW: 13.4 % (ref 11.5–15.5)
WBC: 7.5 10*3/uL (ref 4.0–10.5)

## 2015-08-19 LAB — GLUCOSE, CAPILLARY
GLUCOSE-CAPILLARY: 194 mg/dL — AB (ref 65–99)
Glucose-Capillary: 192 mg/dL — ABNORMAL HIGH (ref 65–99)
Glucose-Capillary: 157 mg/dL — ABNORMAL HIGH (ref 65–99)

## 2015-08-19 LAB — PROTIME-INR
INR: 1.07 (ref 0.00–1.49)
Prothrombin Time: 14.1 seconds (ref 11.6–15.2)

## 2015-08-19 LAB — LIPASE, BLOOD: Lipase: 42 U/L (ref 11–51)

## 2015-08-19 LAB — APTT: aPTT: 26 seconds (ref 24–37)

## 2015-08-19 MED ORDER — CLOPIDOGREL BISULFATE 75 MG PO TABS
75.0000 mg | ORAL_TABLET | Freq: Every day | ORAL | Status: DC
  Administered 2015-08-19: 75 mg via ORAL
  Filled 2015-08-19: qty 1

## 2015-08-19 MED ORDER — ASPIRIN EC 81 MG PO TBEC
81.0000 mg | DELAYED_RELEASE_TABLET | Freq: Every day | ORAL | Status: DC
  Administered 2015-08-19: 81 mg via ORAL
  Filled 2015-08-19: qty 1

## 2015-08-19 MED ORDER — LISINOPRIL 10 MG PO TABS
10.0000 mg | ORAL_TABLET | Freq: Every day | ORAL | Status: DC
  Administered 2015-08-19: 10 mg via ORAL
  Filled 2015-08-19: qty 1

## 2015-08-19 MED ORDER — INSULIN ASPART 100 UNIT/ML ~~LOC~~ SOLN: 0.0000 [IU] | Freq: Every day | SUBCUTANEOUS | Status: DC

## 2015-08-19 MED ORDER — ONDANSETRON HCL 4 MG/2ML IJ SOLN: 4.0000 mg | Freq: Three times a day (TID) | INTRAMUSCULAR | Status: DC | PRN

## 2015-08-19 MED ORDER — PANTOPRAZOLE SODIUM 40 MG PO TBEC
40.0000 mg | DELAYED_RELEASE_TABLET | Freq: Every day | ORAL | Status: DC
  Administered 2015-08-19: 40 mg via ORAL
  Filled 2015-08-19: qty 1

## 2015-08-19 MED ORDER — ATORVASTATIN CALCIUM 80 MG PO TABS: 80.0000 mg | ORAL_TABLET | Freq: Every day | ORAL | Status: DC

## 2015-08-19 MED ORDER — ACETAMINOPHEN 325 MG PO TABS: 650.0000 mg | ORAL_TABLET | Freq: Four times a day (QID) | ORAL | Status: DC | PRN

## 2015-08-19 MED ORDER — ENOXAPARIN SODIUM 60 MG/0.6ML ~~LOC~~ SOLN
50.0000 mg | SUBCUTANEOUS | Status: DC
  Administered 2015-08-19: 50 mg via SUBCUTANEOUS
  Filled 2015-08-19: qty 0.6

## 2015-08-19 MED ORDER — INSULIN ASPART 100 UNIT/ML ~~LOC~~ SOLN
0.0000 [IU] | Freq: Three times a day (TID) | SUBCUTANEOUS | Status: DC
  Administered 2015-08-19 (×2): 2 [IU] via SUBCUTANEOUS

## 2015-08-19 MED ORDER — METOPROLOL TARTRATE 25 MG PO TABS
25.0000 mg | ORAL_TABLET | Freq: Two times a day (BID) | ORAL | Status: DC
  Administered 2015-08-19: 25 mg via ORAL
  Filled 2015-08-19: qty 1

## 2015-08-19 MED ORDER — SODIUM CHLORIDE 0.9% FLUSH
3.0000 mL | Freq: Two times a day (BID) | INTRAVENOUS | Status: DC
  Administered 2015-08-19 (×2): 3 mL via INTRAVENOUS

## 2015-08-19 MED ORDER — TRIAMTERENE-HCTZ 37.5-25 MG PO CAPS
1.0000 | ORAL_CAPSULE | Freq: Every day | ORAL | Status: DC
  Administered 2015-08-19: 1 via ORAL
  Filled 2015-08-19: qty 1

## 2015-08-19 MED ORDER — ACETAMINOPHEN 650 MG RE SUPP: 650.0000 mg | Freq: Four times a day (QID) | RECTAL | Status: DC | PRN

## 2015-08-19 NOTE — H&P (Signed)
History and Physical    Francis Brown ZOX:[id number] DOB: Apr 03, 1951 DOA: 08/18/2015  Referring MD/NP/PA:   PCP: No primary care provider on file.   Outpatient Specialists: none  Patient coming from:  Home  Chief Complaint: Chest discomfort and nausea  HPI: Francis Brown is a 65 y.o. 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  Pt states that started having nausea at about 7:00 PM. His nausea has been persistent when he had his dinner. He reported to EDP that he had chest discomfort, but denies chest pain or chest discomfort currently to me. States that he has had multiple myocardial infarction in the past, and has never had "chest pain," but has always had "strange discomfort." He is very concerned that he may have heart attack again today. He does not have vomiting, diarrhea, abdominal pain. No cough, fever, chills, symptoms of a UTI or unilateral weakness.  ED Course: pt was found to have negative troponin, W BC 9.0, temperature normal, no tachycardia, no tachypnea, stable renal function, negative chest x-ray. Patient's placed on telemetry bed for observation.  Review of Systems:   General: no fevers, chills, no changes in body weight, has fatigue HEENT: no blurry vision, hearing changes or sore throat Pulm: no dyspnea, coughing, wheezing CV: had chest discomfort, no palpitations Abd: has nausea, no vomiting, abdominal pain, diarrhea, constipation GU: no dysuria, burning on urination, increased urinary frequency, hematuria  Ext: no leg edema Neuro: no unilateral weakness, numbness, or tingling, no vision change or hearing loss Skin: no rash MSK: No muscle spasm, no deformity,  no limitation of range of movement in spin Heme: No easy bruising.  Travel history: No recent long distant travel.  Allergy: No Known Allergies  Past Medical History  Diagnosis Date  . Hypertension   . Coronary artery disease     a. 1996 s/p MI [place] Rx;  b. 1998 s/p MI in [place] with CABGx5 in [place];  c. 2010 s/p stenting to the OM in [place], [place] (Xience 4.0x12 DES, Xience 4.0x5mm DES);  e. Myoview 10/16: EF 43%, inferolateral and inferior infarct with some peri-infarct ischemia, Intermediate Risk  . Erectile dysfunction   . Stroke [facility])     a. 2005;  b. 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  . Carotid disease, bilateral (HCC)     a. 01/2013 Carotid U/S: RICA 1-39%, LICA 80-99%. Antegrade vertebral flow.  . Tobacco abuse   . Hyperlipidemia   . Bradycardia, drug induced   . CHF (congestive heart failure) (HCC)   . Diabetes mellitus without complication (HCC) 2015  . Myocardial infarction [facility]) October 17, 2013    Heart Attack    Past Surgical History  Procedure Laterality Date  . Cardiac surgery  07/29/1996  . Coronary stent placement    . Coronary artery bypass graft    . Coronary angioplasty    . Colonoscopy    . Endarterectomy Left 01/18/2013    Procedure: LEFT CAROTID ENDARTERECTOMY WITH PATCH ANGIOPLASTY;  Surgeon: [name], MD;  [facility] OR;  Service: Vascular;  Laterality: Left;  . Carotid endarterectomy Left 01-18-13    cea  . Left heart cath Left September 27, 2013  . Percutaneous coronary stent intervention (pci-s)  September 29, 2013  . Left heart catheterization with coronary angiogram N/A 09/27/2013    Procedure: LEFT HEART CATHETERIZATION WITH CORONARY ANGIOGRAM;  Surgeon: [name], MD;  Location: [facility] CATH LAB;  Service: Cardiovascular;  Laterality: N/A;  . Percutaneous coronary stent intervention (pci-s) N/A 09/29/2013    Procedure: PERCUTANEOUS CORONARY STENT INTERVENTION (PCI-S);  Surgeon: [name], MD;  Location:  [facility] CATH LAB;  Service: Cardiovascular;  Laterality: N/A;  . Cardiac catheterization N/A 02/27/2015    Procedure: Left Heart Cath and Cors/Grafts Angiography;  Surgeon: [name], MD;  Location: [facility] INVASIVE CV LAB;  Service: Cardiovascular;  Laterality: N/A;    Social History:  reports that he quit smoking about 12 years ago. His smoking use included Cigarettes. He has a 24 pack-year smoking history. He has never used smokeless tobacco. He reports that he drinks alcohol. He reports that he does not use illicit drugs.  Family History:  Family History  Problem Relation Age of Onset  . CAD Father     died @ 58  . Heart disease Father     Before 39 years old  . Hyperlipidemia Father   . Heart attack Father   . Other Mother     died @ 8  . Heart disease Mother   . Hyperlipidemia Mother   . Varicose Veins Mother      Prior to Admission medications   Medication Sig Start Date End Date Taking? Authorizing Provider  aspirin EC 81 MG tablet Take 81 mg by mouth daily.   Yes Historical Provider, MD  clopidogrel (PLAVIX) 75 MG tablet Take 1 tablet (75 mg total) by mouth daily. 01/13/13  Yes Ok Anis, NP  lisinopril (PRINIVIL,ZESTRIL) 10 MG tablet Take 10 mg by mouth daily.   Yes Historical Provider, MD  metFORMIN (GLUCOPHAGE) 500 MG tablet Take 500 mg by mouth 2 (two) times daily. 08/15/15  Yes Historical Provider, MD  metoprolol tartrate (LOPRESSOR) 25 MG tablet TAKE 1 TABLET (25 MG TOTAL) BY MOUTH 2 (TWO) TIMES DAILY. 03/22/14  Yes Quintella Reichert, MD  NITROSTAT 0.4 MG SL tablet PLACE 1 TABLET UNDER THE TOUNGE EVERY 5 MINS X 3 DOSES AS NEEDED FOR CHEST PAIN 08/17/15  Yes Brittainy Sherlynn Carbon, PA-C  triamterene-hydrochlorothiazide (DYAZIDE) 37.5-25 MG capsule TAKE 1 EACH (1 CAPSULE TOTAL) BY MOUTH DAILY. 07/20/15  Yes Quintella Reichert, MD  Ustekinumab (STELARA) 90 MG/ML SOSY Inject 90 mg into the skin every 3 (three) months.    Yes Historical Provider, MD  VIAGRA 100 MG tablet Take 100 mg by  mouth as needed for erectile dysfunction.  07/08/13  Yes Historical Provider, MD  metoprolol tartrate (LOPRESSOR) 25 MG tablet TAKE 1 TABLET (25 MG TOTAL) BY MOUTH 2 (TWO) TIMES DAILY. 07/20/15   Quintella Reichert, MD  triamterene-hydrochlorothiazide (DYAZIDE) 37.5-25 MG per capsule Take 1 each (1 capsule total) by mouth daily. 02/16/14   Quintella Reichert, MD    Physical Exam: Filed Vitals:   08/18/15 2300 08/18/15 2330 08/18/15 2340 08/19/15 0135  BP: 117/71 119/72  120/67  Pulse: 69 70  60  Temp:    97.7 F (36.5 C)  TempSrc:    Oral  Resp: 12 12  16   Height:    5\' 10"  (1.778 m)  Weight:    107.4 kg (236 lb 12.4 oz)  SpO2: 91% 95% 98% 96%   General: Not in acute distress HEENT:       Eyes: PERRL, EOMI, no scleral icterus.  ENT: No discharge from the ears and nose, no pharynx injection, no tonsillar enlargement.        Neck: No JVD, no bruit, no mass felt. Heme: No neck lymph node enlargement. Cardiac: S1/S2, RRR, No murmurs, No gallops or rubs. Pulm: No rales, wheezing, rhonchi or rubs. Abd: Soft, nondistended, nontender, no rebound pain, no organomegaly, BS present. GU: No hematuria Ext: No pitting leg edema bilaterally. 2+DP/PT pulse bilaterally. Musculoskeletal: No joint deformities, No joint redness or warmth, no limitation of ROM in spin. Skin: No rashes.  Neuro: Alert, oriented X3, cranial nerves II-XII grossly intact, moves all extremities normally. Psych: Patient is not psychotic, no suicidal or hemocidal ideation. Labs on Admission: I have personally reviewed following labs and imaging studies  CBC:  Recent Labs Lab 08/18/15 2135  WBC 9.0  HGB 15.5  HCT 45.7  MCV 93.5  PLT 186   Basic Metabolic Panel:  Recent Labs Lab 08/18/15 2135  NA 134*  K 4.0  CL 95*  CO2 26  GLUCOSE 277*  BUN 11  CREATININE 1.36*  CALCIUM 9.4   GFR: Estimated Creatinine Clearance: 68.3 mL/min (by C-G formula based on Cr of 1.36). Liver Function Tests: No results for  input(s): AST, ALT, ALKPHOS, BILITOT, PROT, ALBUMIN in the last 168 hours. No results for input(s): LIPASE, AMYLASE in the last 168 hours. No results for input(s): AMMONIA in the last 168 hours. Coagulation Profile: No results for input(s): INR, PROTIME in the last 168 hours. Cardiac Enzymes: No results for input(s): CKTOTAL, CKMB, CKMBINDEX, TROPONINI in the last 168 hours. BNP (last 3 results) No results for input(s): PROBNP in the last 8760 hours. HbA1C: No results for input(s): HGBA1C in the last 72 hours. CBG: No results for input(s): GLUCAP in the last 168 hours. Lipid Profile: No results for input(s): CHOL, HDL, LDLCALC, TRIG, CHOLHDL, LDLDIRECT in the last 72 hours. Thyroid Function Tests: No results for input(s): TSH, T4TOTAL, FREET4, T3FREE, THYROIDAB in the last 72 hours. Anemia Panel: No results for input(s): VITAMINB12, FOLATE, FERRITIN, TIBC, IRON, RETICCTPCT in the last 72 hours. Urine analysis: No results found for: COLORURINE, APPEARANCEUR, LABSPEC, PHURINE, GLUCOSEU, HGBUR, BILIRUBINUR, KETONESUR, PROTEINUR, UROBILINOGEN, NITRITE, LEUKOCYTESUR Sepsis Labs: (procalcitonin:4,lacticidven:4) )No results found for this or any previous visit (from the past 240 hour(s)).   Radiological Exams on Admission: Dg Chest 2 View  08/18/2015  CLINICAL DATA:  Patient feels similar to previous heart attacks. Nausea and diarrhea. EXAM: CHEST  2 VIEW COMPARISON:  09/26/2013 FINDINGS: Postoperative changes in the mediastinum consistent with previous bypass surgery. Normal heart size and pulmonary vascularity. Calcified and tortuous aorta. Mediastinal contours are intact. Slight interstitial pattern to the lungs likely representing fibrosis. No focal airspace disease or consolidation. No blunting of costophrenic angles. No pneumothorax. Degenerative changes in the spine. IMPRESSION: No active cardiopulmonary disease. Electronically Signed   By: Burman Nieves M.D.   On: 08/18/2015  22:18     EKG: Independently reviewed. QTC 409, sinus rhythm, with artificial effects of her baseline, does not seem to have ST or T-wave changes.   Assessment/Plan Principal Problem:   Discomfort in chest Active Problems:   Coronary artery disease   Hypertension   Hyperlipidemia   Old MI (myocardial infarction)   Diabetes mellitus without complication (HCC)   History of stroke   Chronic kidney disease (CKD), stage II (mild)   Nausea   Discomfort in chest: Etiology is not clear. He has very significant cardiac risk factors, including hypertension, hyperlipidemia, diabetes mellitus, CAD,s/p of  CABG and stent placement, stroke, CHF, carotid artery stenosis, CKD, plus past history of atypical presentation for myocardial infarction, it is appropriate to do chest pain rule out.  - will place to Tele bed for obs - cycle CE q6 x3 and repeat her EKG in the am  - Nitroglycerin, metoprolol, plavix and aspirin - Risk factor stratification: will check FLP, UDS and A1C  - 2d echo - Zofran for nausea -Check lipase  CAD: s/p of stent and CABG: -see above  HTN: -continue lisinopril, Dyazide  HLD: Last LDL was 64 on 12/28/13. Patient is not taking medications at home -Check FLP  DM-II: Last A1c 6.8 on 09/27/13, well controled. Patient is taking metformin at home -SSI -Check A1c  History of stroke: -On ASA and plavix  Chronic kidney disease (CKD), stage II (mild): Baseline creatinine 1.2-1.3. His creatinine is 1.36, which is close to baseline. -Follow-up renal function by BMP   DVT ppx: SQ Lovenox Code Status: Full code Family Communication: Yes, patient's wife at bed side Disposition Plan:  Anticipate discharge back to previous home environment Consults called: none Admission status: Obs / tele   Date of Service 08/19/2015    Lorretta HarpIU, Eldene Plocher [facility] Pager [phone]  If 7PM-7AM, please contact night-coverage [email or web address] Password [email or web address] 08/19/2015, 1:55 AM

## 2015-08-19 NOTE — Consult Note (Signed)
Cardiology Consult    Patient ID: Francis Brown MRN: [id number], DOB/AGE: 63/09/1950   Admit date: 08/18/2015 Date of Consult: 08/19/2015  Primary Physician: No primary care provider on file. Primary Cardiologist: T. Mayford Knifeurner, MD  Requesting Provider: D. Janee Mornhompson, MD  Patient Profile    65 y/o ? with a h/o CAD, HTN, HL, CVA, DMII, Carotid dzs, CKD II, and combined CHF, who was admitted with nausea - ? Anginal equivalent.  Past Medical History   Past Medical History  Diagnosis Date  . Hypertensive heart disease   . Coronary artery disease     a. 1996 s/p MI [place];  b. 1998 s/p MI in [place] with CABGx5 in [place];  c. 2010 [place] OM [place];  e. 09/2014 NSTEMI/PCI: VG->PDA (DES x 2), mLAD (DES), dLAD (PTCA); f. 02/2016 Cath: LM 70 (stable), LAD 100ost/p, patent distal stent beyond LIMA, LCX nl, OM1 100, RCA 13366m, LIMA->LAD nl, VG->D1 nl, VG->RPDA nl w/ patent stents, VG->OM1 100, EF nl-->Med Rx.  . Erectile dysfunction   . Stroke [facility](HCC)     a. 2005;  b. 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  . Carotid disease, bilateral (HCC)     a. 01/2013 Carotid U/S: RICA 1-39%, LICA 80-99%. Antegrade vertebral flow; b. s/p L CEA; c. 08/2013 Carotid U/S: Patent LICA, RICA 40%.  . History of tobacco abuse   . Hyperlipidemia   . Bradycardia, drug induced   . Chronic combined systolic and diastolic CHF (congestive heart failure) (HCC)     a. 09/2013 Echo: Ef 45-50%, mid-apicalinferoseptal AK, Gr2 DD.  . Diabetes mellitus without complication (HCC) 2015  . CKD (chronic kidney disease), stage II     Past Surgical History  Procedure Laterality Date  . Cardiac surgery  07/29/1996  . Coronary stent placement    . Coronary artery bypass graft    . Coronary angioplasty    . Colonoscopy    . Endarterectomy Left 01/18/2013    Procedure: LEFT CAROTID ENDARTERECTOMY WITH PATCH ANGIOPLASTY;  Surgeon: Fransisco HertzBrian L Chen, MD;  [facility];  Service: Vascular;   Laterality: Left;  . Carotid endarterectomy Left 01-18-13    cea  . Left heart cath Left September 27, 2013  . Percutaneous coronary stent intervention (pci-s)  September 29, 2013  . Left heart catheterization with coronary angiogram N/A 09/27/2013    Procedure: LEFT HEART CATHETERIZATION WITH CORONARY ANGIOGRAM;  Surgeon: Lesleigh NoeHenry W Smith III, MD;  [facility];  Service: Cardiovascular;  Laterality: N/A;  . Percutaneous coronary stent intervention (pci-s) N/A 09/29/2013    Procedure: PERCUTANEOUS CORONARY STENT INTERVENTION (PCI-S);  Surgeon: Peter M Swazil[place]Jordan, MD;  [facility];  Service: Cardiovascular;  Laterality: N/A;  . Cardiac catheterization N/A 02/27/2015    Procedure: Left Heart Cath and Cors/Grafts Angiography;  Surgeon: Peter M Swazil[place]Jordan, MD;  [facility];  Service: Cardiovascular;  Laterality: N/A;     Allergies  No Known Allergies  History of Present Illness    65 y/o ? with the above complex past medical history including CAD s/p prior MI's in [place] in the mid to late-90's with subsequent CABG x 5 and PCI/DES to the native OM1 in 2010.  More recently, he underwent PCI/DES x 2 of the VG  RPDA, DES to the LAD, and PTCA to the dLAD in 09/2013 in the setting of a NSTEMI.  He was last seen in our clinic in 01/2015 following  an abnormal myoview.  He underwent cath in 02/2015 revealing a patent LIMA  LAD, patent VG  Diag, patent VG  RPDA (patent stents), patent distal LAD stent, known occlusion of the VG  OM1 and occluded native OM1 (both known since 09/2013).  He had a 70% LM stenosis, which was unchanged and not felt to be responsible for his Ss, thus he was medically managed.    Since his catheterization, he reports having done well without any chest pain or dyspnea. He remains reasonably active without limitations. He says that over the past 3 months, he has had a fair amount of stress but things are finally coming down. Yesterday, following his evening meal, he was in  the car with his wife and developed abdominal discomfort followed by severe nausea without vomiting. After several hours ongoing symptoms, he presented to the [facility] ED where he reported that symptoms may have been similar to prior angina. At no point, was he having chest pain or dyspnea. He did not develop vomiting. History with aspirin and nitroglycerin without relief. Chest x-ray and ECG were nonacute. Lab work showed mild renal insufficiency with normal hemoglobin and hematocrit, normal BMP, and normal troponins. Symptoms resolved after several hours without any further intervention. This morning, he says his stomach/abdomen is feeling better though perhaps not at baseline. He is not tender. He has had no chest pain and troponins remained normal. He would like to go home.  Inpatient Medications    . aspirin EC  81 mg Oral Daily  . atorvastatin  80 mg Oral q1800  . clopidogrel  75 mg Oral Daily  . enoxaparin (LOVENOX) injection  50 mg Subcutaneous Q24H  . insulin aspart  0-5 Units Subcutaneous QHS  . insulin aspart  0-9 Units Subcutaneous TID WC  . lisinopril  10 mg Oral Daily  . metoprolol tartrate  25 mg Oral BID  . pantoprazole  40 mg Oral Q0600  . sodium chloride flush  3 mL Intravenous Q12H  . triamterene-hydrochlorothiazide  1 capsule Oral Daily    Family History    Family History  Problem Relation Age of Onset  . CAD Father     died @ 84  . Heart disease Father     Before 55 years old  . Hyperlipidemia Father   . Heart attack Father   . Other Mother     died @ 63  . Heart disease Mother   . Hyperlipidemia Mother   . Varicose Veins Mother     Social History    Social History   Social History  . Marital Status: Divorced    Spouse Name: N/A  . Number of Children: N/A  . Years of Education: N/A   Occupational History  . Not on file.   Social History Main Topics  . Smoking status: Former Smoker -- 0.80 packs/day for 30 years    Types: Cigarettes    Quit  date: 08/20/2003  . Smokeless tobacco: Never Used  . Alcohol Use: Yes     Comment: occ  . Drug Use: No  . Sexual Activity: Not on file   Other Topics Concern  . 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  Spent much of his work life [place] but is now base in [employer].  Does not routinely exercise but is active around the house w/o limitations.           Review of Systems  General:  No chills, fever, night sweats or weight changes.  Cardiovascular:  No chest pain, dyspnea on exertion, edema, orthopnea, palpitations, paroxysmal nocturnal dyspnea. Dermatological: No rash, lesions/masses Respiratory: No cough, dyspnea Urologic: No hematuria, dysuria Abdominal:   He developed abdominal discomfort and nausea several hours prior to presentation to the ED. No vomiting, diarrhea, bright red blood per rectum, melena, or hematemesis Neurologic:  No visual changes, wkns, changes in mental status. All other systems reviewed and are otherwise negative except as noted above.  Physical Exam    Blood pressure 149/79, pulse 62, temperature 98 F (36.7 C), temperature source Oral, resp. rate 16, height 5\' 10"  (1.778 m), weight 236 lb 12.4 oz (107.4 kg), SpO2 97 %.  General: Pleasant, NAD Psych: Normal affect. Neuro: Alert and oriented X 3. Moves all extremities spontaneously. HEENT: Normal  Neck: Supple without bruits or JVD. Lungs:  Resp regular and unlabored, CTA. Heart: RRR no s3, s4, or murmurs. Abdomen: Soft, non-tender, non-distended, BS + x 4.  Extremities: No clubbing, cyanosis or edema. DP/PT/Radials 2+ and equal bilaterally.  Labs    Troponin [facility] of Care Test)  Recent Labs  08/18/15 2153  TROPIPOC 0.01    Recent Labs  08/19/15 0212 08/19/15 0737  TROPONINI <0.03 <0.03   Lab Results  Component Value Date   WBC 7.5 08/19/2015   HGB 14.7 08/19/2015   HCT 43.4 08/19/2015   MCV 94.1 08/19/2015   PLT 188 08/19/2015       Recent Labs Lab 08/19/15 0737  NA 139  K 3.7  CL 101  CO2 28  BUN 9  CREATININE 1.22  CALCIUM 8.9  GLUCOSE 159*   Lab Results  Component Value Date   CHOL 200 08/19/2015   HDL 36* 08/19/2015   LDLCALC 127* 08/19/2015   TRIG 185* 08/19/2015    Radiology Studies    Dg Chest 2 View  08/18/2015  CLINICAL DATA:  Patient feels similar to previous heart attacks. Nausea and diarrhea. EXAM: CHEST  2 VIEW COMPARISON:  09/26/2013 FINDINGS: Postoperative changes in the mediastinum consistent with previous bypass surgery. Normal heart size and pulmonary vascularity. Calcified and tortuous aorta. Mediastinal contours are intact. Slight interstitial pattern to the lungs likely representing fibrosis. No focal airspace disease or consolidation. No blunting of costophrenic angles. No pneumothorax. Degenerative changes in the spine. IMPRESSION: No active cardiopulmonary disease. Electronically Signed   By: Burman Nieves M.D.   On: 08/18/2015 22:18    ECG & Cardiac Imaging    SB, 55, 1st deg AVB, LAD, inf infarct.  No acute st/t changes.  Assessment & Plan    1.  Nausea: Etiology unclear. Patient developed abdominal discomfort followed by profound nausea that progressed over several hours and then finally resolved without treatment for nausea. He initially was concerned that perhaps this was an anginal equivalent however troponins are normal. At no point did he have chest pain. ECG is nonacute. He wishes to go home and has outpatient cardiology follow-up scheduled with Dr. Mayford Knife for May 25. In the absence of objective evidence of ischemia with catheterization in the past 7 months revealing stable anatomy, discharge and outpatient follow-up is appropriate.  2. Coronary artery disease: Status post prior CABG with multiple interventions. His last catheterization was in November 2016 revealing stable anatomy including a patent LIMA to the LAD, patent vein graft to the diagonal, and patent vein  graft to PDA. As above, nausea unlikely to be an anginal equivalent as it was associated with abdominal  discomfort and persisted several hours. Despite that, enzymes have been negative and ECG nonacute. Plan outpatient follow-up with Dr. Mayford Knife as scheduled. Continue aspirin, statin, Plavix, beta blocker, and ACE inhibitor therapy.  3. Hypertensive heart disease: Blood pressure was elevated this morning but otherwise trending in the one teens to 120s. Continue beta blocker, ACE inhibitor, and diuretic therapy.  4. Hyperlipidemia: LDL is 127 on lipid profile drawn at 2 AM. LDL was 64 and September 2015. Either the profile drawn overnight was not fasting or he has not been compliant with his Lipitor.  5. Type 2 diabetes mellitus: Insulin per internal medicine. Continue ACE inhibitor and statin.  6. Stage II chronic kidney disease: Creatinine relatively stable. Continue ACE inhibitor.  7.  Carotid Arterial Dzs:  S/p L CEA.  Vasc surgery f/u scheduled w/ repeat u/s in coming weeks.  Cont asa/statin.  Signed, Nicolasa Ducking, NP 08/19/2015, 11:01 AM  Cardiology attending  Patient seen and examined. I agree with the findings as noted above by Mr. Nicolasa Ducking, nurse practitioner. The patient is a 65 year old man with known coronary artery disease, status post bypass surgery, and multiple percutaneous interventions. He was admitted with nausea, and abdominal pain. His exam is notable for him being well appearing and in no distress. Cardiovascular exam revealed regular rate rhythm. Lungs are clear. Extremities demonstrated no edema. He has cardiac markers have been negative. EKG is nonacute. He does not have angina. At this point, there is no indication for additional testing as an inpatient. He may be discharged home with follow-up as previously arranged by his general cardiologist Dr. Mayford Knife.   Lewayne Bunting, M.D.

## 2015-08-19 NOTE — Progress Notes (Signed)
   08/19/15 0135  Vitals  Temp 97.7 F (36.5 C)  Temp Source Oral  BP 120/67 mmHg  BP Location Right Arm  BP Method Automatic  Patient Position (if appropriate) Sitting  Pulse Rate 60  Pulse Rate Source Dinamap  ECG Heart Rate (!) 58  Cardiac Rhythm SB;Heart block  Heart Block Type 1st degree AVB  Resp 16  Oxygen Therapy  SpO2 96 %  O2 Device Room Air  Pain Assessment  Pain Assessment No/denies pain  Height and Weight  Height 5\' 10"  (1.778 m)  Weight 107.4 kg (236 lb 12.4 oz)  BSA (Calculated - sq m) 2.3 sq meters  BMI (Calculated) 34  Weight in (lb) to have BMI = 25 173.9  ECG Intervals  PR interval 0.22  QRS interval 0.06  QT interval 0.38  QTc interval 0.36    Pt arrived from the ED. Pt. And family oriented to room, call bell, and the unit. CCMD called and verified. Patient denies any pain at this time. Will continue to monitor closely.

## 2015-08-19 NOTE — Progress Notes (Addendum)
Pt in stable condition. This RN went over discharge summary with pt, pt verbalised understanding, paper prescription given to pt, pt belongings at bedside, iv taken out, tele dc ccmd notified. Pt taken off the floor on a wheelchair

## 2015-08-19 NOTE — Discharge Summary (Signed)
Physician Discharge Summary  Francis Brown:[id number] DOB: [birth year] DOA: 08/18/2015  PCP: No primary care provider on file.  Admit date: 08/18/2015 Discharge date: 08/19/2015  Time spent: 65 minutes  Recommendations for Outpatient Follow-up:  1. Follow-up with Dr. Armanda Magic as previously scheduled.   Discharge Diagnoses:  Principal Problem:   Nausea Active Problems:   Discomfort in chest   Coronary artery disease   Hypertension   Hyperlipidemia   Old MI (myocardial infarction)   Diabetes mellitus without complication (HCC)   History of stroke   Chronic kidney disease (CKD), stage II (mild)   Chronic combined systolic and diastolic CHF (congestive heart failure) (HCC)   Hypertensive heart disease   CKD (chronic kidney disease), stage II   Discharge Condition: Stable and improved  Diet recommendation: Heart healthy  Filed Weights   08/18/15 2128 08/19/15 0135  Weight: 104.327 kg (230 lb) 107.4 kg (236 lb 12.4 oz)    History of present illness:  Per Dr Christoper Allegra is a 65 y.o. male with medical history significant of CAD, S/P stent placement, s/p of CABG, hypertension, hyperlipidemia, diabetes mellitus, stroke, combined systolic and diastolic congestive heart failure with EF of 45-50 percent, bilateral carotid artery stenosis, chronic kidney disease-stage II, erectile dysfunction, who presented with chest discomfort and nausea.  Pt stated that started having nausea at about 7:00 PM. His nausea had been persistent when he had his dinner. He reported to EDP that he had chest discomfort, but denied chest pain or chest discomfort to admitting MD. Stated that he has had multiple myocardial infarction in the past, and has never had "chest pain," but has always had "strange discomfort." He is very concerned that he may have heart attack again today. He did not have vomiting, diarrhea, abdominal pain. No cough, fever, chills, symptoms of a UTI or unilateral weakness.  ED  Course: pt was found to have negative troponin, WBC 9.0, temperature normal, no tachycardia, no tachypnea, stable renal function, negative chest x-ray. Patient's placed on telemetry bed for observation.   Hospital Course:  #1 nausea Questionable etiology. Possible transient gastroenteritis. Patient was admitted to telemetry as patient had had prior history of atypical presentation for coronary artery disease. Patient with significant coronary artery disease and multiple risk factors and a such that was concern for an anginal equivalent. Cardiac enzymes were cycled which were negative. BNP was 73.5. Lipase levels were within normal limits. EKG had no ischemic changes. Due to patient's extensive cardiac history and atypical presentations in the past at cardiology consultation was obtained and patient was seen in consultation by Dr. Ladona Ridgel. Patient improved clinically during the hospitalization and had no further nausea symptoms and was tolerating a diet. It was felt per cardiology that as patient's cardiac enzymes have been negative and EKG had no ischemic changes and patient had no chest pain it was felt no further additional testing was needed as an inpatient and patient may follow-up with his cardiologist as previously scheduled within the next one to 2 weeks. Patient be discharged in stable and improved condition.  #2 type 2 diabetes Remained stable throughout the hospitalization.  #3 chronic kidney disease stage II Remained stable.  #4 coronary artery disease status post CABG Patient was admitted to the hospital due to nausea with concerns for an anginal equivalent. Cardiac enzymes were cycled which were negative. EKG had no ischemic changes. Patient improved clinically and symptoms had resolved. Patient was seen in consultation by cardiology who felt no further  workup was needed and patient may follow-up with his cardiologist as outpatient. Patient was maintained on home regimen of aspirin, statin,  Plavix, beta blocker and ACE inhibitor. Patient's Lipitor was resumed as patient was no longer taking it at home and fasting lipid panel had a LDL of 127.  #5 hypertension Remained stable. Patient was maintained on home regimen of beta blocker, ACE inhibitor and diuretics.  #6 hyperlipidemia Fasting lipid panel with LDL of 127. It was noted per admitting physician that patient was no longer taking his statin at home. Patient was resumed back on his home regimen of Lipitor. Outpatient follow-up.  #7 carotid artery disease Outpatient follow-up.  Procedures:  Chest x-ray 08/18/2015  Consultations:  Cardiology: Dr Ladona Ridgelaylor 08/19/2015  Discharge Exam: Filed Vitals:   08/19/15 0502 08/19/15 1006  BP: 111/63 149/79  Pulse: 55 62  Temp: 97.6 F (36.4 C) 98 F (36.7 C)  Resp: 17 16    General: NAD Cardiovascular: RRR Respiratory: CTAB  Discharge Instructions   Discharge Instructions    Diet - low sodium heart healthy    Complete by:  As directed      Discharge instructions    Complete by:  As directed   Follow up with Dr Mayford Knifeurner, cardiology as previously scheduled.     Increase activity slowly    Complete by:  As directed           Current Discharge Medication List    START taking these medications   Details  atorvastatin (LIPITOR) 80 MG tablet Take 1 tablet (80 mg total) by mouth daily at 6 PM. Qty: 30 tablet, Refills: 3      CONTINUE these medications which have NOT CHANGED   Details  aspirin EC 81 MG tablet Take 81 mg by mouth daily.    clopidogrel (PLAVIX) 75 MG tablet Take 1 tablet (75 mg total) by mouth daily. Qty: 30 tablet, Refills: 6    lisinopril (PRINIVIL,ZESTRIL) 10 MG tablet Take 10 mg by mouth daily.    metFORMIN (GLUCOPHAGE) 500 MG tablet Take 500 mg by mouth 2 (two) times daily. Refills: 4    NITROSTAT 0.4 MG SL tablet PLACE 1 TABLET UNDER THE TOUNGE EVERY 5 MINS X 3 DOSES AS NEEDED FOR CHEST PAIN Qty: 25 tablet, Refills: 5    Ustekinumab  (STELARA) 90 MG/ML SOSY Inject 90 mg into the skin every 3 (three) months.     VIAGRA 100 MG tablet Take 100 mg by mouth as needed for erectile dysfunction.     metoprolol tartrate (LOPRESSOR) 25 MG tablet TAKE 1 TABLET (25 MG TOTAL) BY MOUTH 2 (TWO) TIMES DAILY. Qty: 60 tablet, Refills: 0    triamterene-hydrochlorothiazide (DYAZIDE) 37.5-25 MG per capsule Take 1 each (1 capsule total) by mouth daily. Qty: 30 capsule, Refills: 6       No Known Allergies Follow-up Information    Follow up with Quintella ReichertURNER,TRACI R, MD.   Specialty:  Cardiology   Why:  f/u as scheduled.   Contact information:   [address] [place] [address] [phone]        The results of significant diagnostics from this hospitalization (including imaging, microbiology, ancillary and laboratory) are listed below for reference.    Significant Diagnostic Studies: Dg Chest 2 View  08/18/2015  CLINICAL DATA:  Patient feels similar to previous heart attacks. Nausea and diarrhea. EXAM: CHEST  2 VIEW COMPARISON:  09/26/2013 FINDINGS: Postoperative changes in the mediastinum consistent with previous bypass surgery. Normal heart  size and pulmonary vascularity. Calcified and tortuous aorta. Mediastinal contours are intact. Slight interstitial pattern to the lungs likely representing fibrosis. No focal airspace disease or consolidation. No blunting of costophrenic angles. No pneumothorax. Degenerative changes in the spine. IMPRESSION: No active cardiopulmonary disease. Electronically Signed   By: Burman Nieves M.D.   On: 08/18/2015 22:18    Microbiology: No results found for this or any previous visit (from the past 240 hour(s)).   Labs: Basic Metabolic Panel:  Recent Labs Lab 08/18/15 2135 08/19/15 0737  NA 134* 139  K 4.0 3.7  CL 95* 101  CO2 26 28  GLUCOSE 277* 159*  BUN 11 9  CREATININE 1.36* 1.22  CALCIUM 9.4 8.9   Liver Function Tests: No results for input(s): AST, ALT, ALKPHOS,  BILITOT, PROT, ALBUMIN in the last 168 hours.  Recent Labs Lab 08/19/15 0212  LIPASE 42   No results for input(s): AMMONIA in the last 168 hours. CBC:  Recent Labs Lab 08/18/15 2135 08/19/15 0737  WBC 9.0 7.5  HGB 15.5 14.7  HCT 45.7 43.4  MCV 93.5 94.1  PLT 186 188   Cardiac Enzymes:  Recent Labs Lab 08/19/15 0212 08/19/15 0737  TROPONINI <0.03 <0.03   BNP: BNP (last 3 results)  Recent Labs  08/19/15 0212  BNP 73.5    ProBNP (last 3 results) No results for input(s): PROBNP in the last 8760 hours.  CBG:  Recent Labs Lab 08/19/15 0154 08/19/15 0612 08/19/15 1116  GLUCAP 192* 157* 194*       Signed:  THOMPSON,DANIEL MD.  [facility] 08/19/2015, 2:28 PM

## 2015-08-20 LAB — HEMOGLOBIN A1C
Hgb A1c MFr Bld: 8.6 % — ABNORMAL HIGH (ref 4.8–5.6)
MEAN PLASMA GLUCOSE: 200 mg/dL

## 2015-09-06 ENCOUNTER — Ambulatory Visit (INDEPENDENT_AMBULATORY_CARE_PROVIDER_SITE_OTHER): Payer: BLUE CROSS/BLUE SHIELD | Admitting: Cardiology

## 2015-09-06 ENCOUNTER — Encounter: Payer: Self-pay | Admitting: Cardiology

## 2015-09-06 VITALS — BP 136/86 | HR 58 | Ht 70.0 in | Wt 232.4 lb

## 2015-09-06 DIAGNOSIS — I2510 Atherosclerotic heart disease of native coronary artery without angina pectoris: Secondary | ICD-10-CM

## 2015-09-06 DIAGNOSIS — I2583 Coronary atherosclerosis due to lipid rich plaque: Principal | ICD-10-CM

## 2015-09-06 DIAGNOSIS — I10 Essential (primary) hypertension: Secondary | ICD-10-CM

## 2015-09-06 DIAGNOSIS — I5042 Chronic combined systolic (congestive) and diastolic (congestive) heart failure: Secondary | ICD-10-CM | POA: Diagnosis not present

## 2015-09-06 DIAGNOSIS — I252 Old myocardial infarction: Secondary | ICD-10-CM

## 2015-09-06 DIAGNOSIS — E785 Hyperlipidemia, unspecified: Secondary | ICD-10-CM

## 2015-09-06 MED ORDER — ATORVASTATIN CALCIUM 80 MG PO TABS: 80.0000 mg | ORAL_TABLET | Freq: Every day | ORAL | Status: DC

## 2015-09-06 NOTE — Patient Instructions (Signed)
Medication Instructions:  1) START LIPITOR 80 mg daily  Labwork: Your physician recommends that you return for FASTING lab work in: 6 WEEKS   Testing/Procedures: None  Follow-Up: Your physician wants you to follow-up in: 6 months with Dr. Mayford Knifeurner. You will receive a reminder letter in the mail two months in advance. If you don't receive a letter, please call our office to schedule the follow-up appointment.   Any Other Special Instructions Will Be Listed Below (If Applicable).     If you need a refill on your cardiac medications before your next appointment, please call your pharmacy.

## 2015-09-06 NOTE — Progress Notes (Signed)
Cardiology Office Note    Date:  09/06/2015   ID:  CALVYN KURTZMAN, DOB 1950/09/18, MRN [id number]  PCP:  Joycelyn Rua, MD  Cardiologist:  Armanda Magic, MD   Chief Complaint  Patient presents with  . Coronary Artery Disease  . Hypertension  . Hyperlipidemia    History of Present Illness:  Francis Brown is a 65 y.o. male with a hx of CAD s/p CABG (MI in [place] >>> CABG in [place], [place]) and prior DES to S-OM in 2010 ([place], [place]), NSTEMI 09/2014 with cath demonstrating an occluded SVG-OM1, severe graft disease in the SVG-RV marginal/PDA and severe disease in the native LAD distal to the graft insertion s/p DES to the SVG-PDA, DES to the mid LAD and balloon angioplasty to the distal LAD. Repeat cath in !!/2016 showed patent stents in the SVG to PDA and LAD and otherwise patent grafts except chronically occluded SVG OM.  He also has a history of prior CVA, carotid stenosis s/p L CEA, HTN, HL.  He was recently in [facility] with nausea and ? Vague chest discomfort.  Workup was normal and he was felt to have gastroenteritis or due to taking metformin and not eating afterwards.  He says that his symptoms were nothing like what he had with hie heart in the past.  No stress testing was done inpt and he now returns for followup. He is doing well. The patient denies chest pain, shortness of breath, syncope, orthopnea, PND or significant pedal edema.    Past Medical History  Diagnosis Date  . Hypertensive heart disease   . Coronary artery disease     a. 1996 s/p MI [place] Rx;  b. 1998 s/p MI in [place] with CABGx5 in [place];  c. 2010 PCI to native OM in [place], [place];  e. 09/2014 NSTEMI/PCI: VG->PDA (DES x 2), mLAD (DES), dLAD (PTCA); f. 02/2016 Cath: LM 70 (stable), LAD 100ost/p, patent distal stent beyond LIMA, LCX nl, OM1 100, RCA 127m, LIMA->LAD nl, VG->D1 nl, VG->RPDA nl w/ patent stents, VG->OM1 100, EF nl-->Med Rx.  . Erectile dysfunction   . Stroke [facility])     a. 2005;  b. 01/2013 left frontotemporal embolic  cva in left cerebral artery territory.  . Carotid disease, bilateral (HCC)     a. 01/2013 Carotid U/S: RICA 1-39%, LICA 80-99%. Antegrade vertebral flow; b. s/p L CEA; c. 08/2013 Carotid U/S: Patent LICA, RICA 40%.  . History of tobacco abuse   . Hyperlipidemia   . Bradycardia, drug induced   . Chronic combined systolic and diastolic CHF (congestive heart failure) (HCC)     a. 09/2013 Echo: Ef 45-50%, mid-apicalinferoseptal AK, Gr2 DD.  . Diabetes mellitus without complication (HCC) 2015  . CKD (chronic kidney disease), stage II     Past Surgical History  Procedure Laterality Date  . Cardiac surgery  07/29/1996  . Coronary stent placement    . Coronary artery bypass graft    . Coronary angioplasty    . Colonoscopy    . Endarterectomy Left 01/18/2013    Procedure: LEFT CAROTID ENDARTERECTOMY WITH PATCH ANGIOPLASTY;  Surgeon: [name], MD;  Location: [facility] OR;  Service: Vascular;  Laterality: Left;  . Carotid endarterectomy Left 01-18-13    cea  . Left heart cath Left September 27, 2013  . Percutaneous coronary stent intervention (pci-s)  September 29, 2013  . Left heart catheterization with coronary angiogram N/A 09/27/2013    Procedure: LEFT HEART CATHETERIZATION WITH CORONARY ANGIOGRAM;  Surgeon:  Lesleigh NoeHenry W Smith III, MD;  [facility];  Service: Cardiovascular;  Laterality: N/A;  . Percutaneous coronary stent intervention (pci-s) N/A 09/29/2013    Procedure: PERCUTANEOUS CORONARY STENT INTERVENTION (PCI-S);  Surgeon: Peter M SwazilandJordan, MD;  [facility];  Service: Cardiovascular;  Laterality: N/A;  . Cardiac catheterization N/A 02/27/2015    Procedure: Left Heart Cath and Cors/Grafts Angiography;  Surgeon: Peter M SwazilandJordan, MD;  [facility];  Service: Cardiovascular;  Laterality: N/A;    Current Medications: Outpatient Prescriptions Prior to Visit  Medication Sig Dispense Refill  . aspirin EC 81 MG tablet Take 81 mg by mouth daily.    . clopidogrel (PLAVIX) 75 MG  tablet Take 1 tablet (75 mg total) by mouth daily. 30 tablet 6  . lisinopril (PRINIVIL,ZESTRIL) 10 MG tablet Take 10 mg by mouth daily.    . metFORMIN (GLUCOPHAGE) 500 MG tablet Take 500 mg by mouth 2 (two) times daily.  4  . metoprolol tartrate (LOPRESSOR) 25 MG tablet TAKE 1 TABLET (25 MG TOTAL) BY MOUTH 2 (TWO) TIMES DAILY. 60 tablet 0  . NITROSTAT 0.4 MG SL tablet PLACE 1 TABLET UNDER THE TOUNGE EVERY 5 MINS X 3 DOSES AS NEEDED FOR CHEST PAIN 25 tablet 5  . triamterene-hydrochlorothiazide (DYAZIDE) 37.5-25 MG per capsule Take 1 each (1 capsule total) by mouth daily. 30 capsule 6  . Ustekinumab (STELARA) 90 MG/ML SOSY Inject 90 mg into the skin every 3 (three) months.     [name]. VIAGRA 100 MG tablet Take 100 mg by mouth as needed for erectile dysfunction.     [name]. atorvastatin (LIPITOR) 80 MG tablet Take 1 tablet (80 mg total) by mouth daily at 6 PM. (Patient not taking: Reported on 09/06/2015) 30 tablet 3   No facility-administered medications prior to visit.     Allergies:   Review of patient's allergies indicates no known allergies.   Social History   Social History  . Marital Status: Divorced    Spouse Name: N/A  . Number of Children: N/A  . Years of Education: N/A   Social History Main Topics  . Smoking status: Former Smoker -- 0.80 packs/day for 30 years    Types: Cigarettes    Quit date: 08/20/2003  . Smokeless tobacco: Never Used  . Alcohol Use: Yes     Comment: occ  . Drug Use: No  . Sexual Activity: Not Asked   Other Topics Concern  . 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  [place]  Does not routinely exercise but is active around the house w/o limitations.           Family History:  The patient's family history includes CAD in his father; Heart attack in his father; Heart disease in his father and mother; Hyperlipidemia in his father and mother; Other in his mother;  Varicose Veins in his mother.   ROS:   Please see the history of present illness.    ROS All other systems reviewed and are negative.   PHYSICAL EXAM:   VS:  BP 136/86 mmHg  Pulse 58  Ht 5\' 10"  (1.778 m)  Wt 232 lb 6.4 oz (105.416 kg)  BMI 33.35 kg/m2  SpO2 96%   GEN: Well nourished, well developed, in no acute distress HEENT: normal Neck: no JVD, carotid bruits, or masses Cardiac: RRR; no murmurs, rubs, or  gallops,no edema.  Intact distal pulses bilaterally.  Respiratory:  clear to auscultation bilaterally, normal work of breathing GI: soft, nontender, nondistended, + BS MS: no deformity or atrophy Skin: warm and dry, no rash Neuro:  Alert and Oriented x 3, Strength and sensation are intact Psych: euthymic mood, full affect  Wt Readings from Last 3 Encounters:  09/06/15 232 lb 6.4 oz (105.416 kg)  08/19/15 236 lb 12.4 oz (107.4 kg)  02/27/15 248 lb (112.492 kg)      Studies/Labs Reviewed:   EKG:  EKG is not ordered today.   Recent Labs: 01/29/2015: ALT 66*; TSH 3.397 08/19/2015: B Natriuretic Peptide 73.5; BUN 9; Creatinine, Ser 1.22; Hemoglobin 14.7; Platelets 188; Potassium 3.7; Sodium 139   Lipid Panel    Component Value Date/Time   CHOL 200 08/19/2015 0212   TRIG 185* 08/19/2015 0212   HDL 36* 08/19/2015 0212   CHOLHDL 5.6 08/19/2015 0212   VLDL 37 08/19/2015 0212   LDLCALC 127* 08/19/2015 0212    Additional studies/ records that were reviewed today include:  none    ASSESSMENT:    1. Coronary artery disease due to lipid rich plaque   2. Essential hypertension   3. Old MI (myocardial infarction)   4. Chronic combined systolic and diastolic CHF (congestive heart failure) (HCC)   5. Hyperlipidemia      PLAN:  In order of problems listed above:  1. ASCAD with remote CABG and PCI of SVG to OM and MI 09/2014 with occluded SVG-OM1, severe graft disease in the SVG-RV marginal/PDA and severe disease in the native LAD distal to the graft insertion s/p DES  to the SVG-PDA, DES to the mid LAD and balloon angioplasty to the distal LAD.  He is doing well with no further nausea or angina.  I do not think he needs any further cardiac workup at this time since his symptoms were most likely GI and he had a cath in November 2016 with patent stents and grafts.  Continue ASA/statin/Plavix/BB. 2. HTN - BP controlled on current medical regimen. Continue BB/ACE I/diuretic 3. Old MI - continue BB 4. Chronic combined systolic/diastolic CHF - EF 45-50% - appears euvolemic on exam.  Continue ARB/BB/diuretic 5.    Dyslipidemia - LDL goal < 70.  Continue statin (this was restarted in hospital the first of may as he had stopped it prior.  He did not get it filled at discharge so will start it now). Recheck FLp and ALT in  6 weeks   Medication Adjustments/Labs and Tests Ordered: Current medicines are reviewed at length with the patient today.  Concerns regarding medicines are outlined above.  Medication changes, Labs and Tests ordered today are listed in the Patient Instructions below.  There are no Patient Instructions on file for this visit.   Signed, Armanda Magic, MD  09/06/2015 10:26 AM    [facility] [address] [place], [place], [place]  [address] Phone: [phone]; Fax: [phone]

## 2015-10-04 ENCOUNTER — Other Ambulatory Visit: Payer: Self-pay | Admitting: Cardiology

## 2015-10-22 ENCOUNTER — Other Ambulatory Visit: Payer: BLUE CROSS/BLUE SHIELD

## 2015-12-11 ENCOUNTER — Encounter: Payer: Self-pay | Admitting: Family

## 2015-12-13 ENCOUNTER — Encounter: Payer: Self-pay | Admitting: Family

## 2015-12-13 ENCOUNTER — Other Ambulatory Visit: Payer: Self-pay | Admitting: Family

## 2015-12-13 ENCOUNTER — Ambulatory Visit (INDEPENDENT_AMBULATORY_CARE_PROVIDER_SITE_OTHER): Payer: BLUE CROSS/BLUE SHIELD | Admitting: Family

## 2015-12-13 ENCOUNTER — Ambulatory Visit (HOSPITAL_COMMUNITY)
Admission: RE | Admit: 2015-12-13 | Discharge: 2015-12-13 | Disposition: A | Discharge status: Home / Self Care | Payer: BLUE CROSS/BLUE SHIELD | Source: Ambulatory Visit | Attending: Family | Admitting: Family

## 2015-12-13 VITALS — BP 128/81 | HR 52 | Temp 97.7°F | Resp 16 | Ht 70.0 in | Wt 242.0 lb

## 2015-12-13 DIAGNOSIS — I6522 Occlusion and stenosis of left carotid artery: Secondary | ICD-10-CM | POA: Diagnosis not present

## 2015-12-13 DIAGNOSIS — Z9889 Other specified postprocedural states: Secondary | ICD-10-CM

## 2015-12-13 DIAGNOSIS — Z87891 Personal history of nicotine dependence: Secondary | ICD-10-CM | POA: Diagnosis not present

## 2015-12-13 DIAGNOSIS — I6529 Occlusion and stenosis of unspecified carotid artery: Secondary | ICD-10-CM

## 2015-12-13 DIAGNOSIS — I6521 Occlusion and stenosis of right carotid artery: Secondary | ICD-10-CM | POA: Diagnosis not present

## 2015-12-13 DIAGNOSIS — Z48812 Encounter for surgical aftercare following surgery on the circulatory system: Secondary | ICD-10-CM

## 2015-12-13 LAB — VAS US CAROTID
LCCAPDIAS: 24 cm/s
LEFT VERTEBRAL DIAS: -10 cm/s
LICADDIAS: -32 cm/s
LICAPDIAS: -17 cm/s
LICAPSYS: -79 cm/s
Left CCA dist dias: 26 cm/s
Left CCA dist sys: 119 cm/s
Left CCA prox sys: 115 cm/s
Left ICA dist sys: -86 cm/s
RIGHT CCA MID DIAS: 21 cm/s
RIGHT ECA DIAS: -11 cm/s
RIGHT VERTEBRAL DIAS: -10 cm/s
Right CCA prox dias: 19 cm/s
Right CCA prox sys: 139 cm/s
Right cca dist sys: -87 cm/s

## 2015-12-13 NOTE — Progress Notes (Signed)
Chief Complaint: Follow up Extracranial Carotid Artery Stenosis   History of Present Illness  Francis Brown is a 65 y.o. male patient of Dr. [name] who is s/p L CEA (Date: 01/18/13).  He had a stroke in 2005, TIA in 2012 and 2014 right before the left CEA, as manifested by right UE contracture an numbness, denies amaurosis fugax, denies unilateral facial drooping, denies aphasia. The 2005 stroke affected his left hand as numbness which has resolved.  He saw Dr. [name], neurology, in the past,  He had 4 MI's, 1996 with angioplasty, 1998 with 5 vessel CABG, 2010 with 2 stents, in 2015 with 2-3 stents.   Pt denies claudication symptoms with walking, denies non healing wounds.   Pt Diabetic: yes, diagnosed early 2016, A1C in May 2017 was 8.6 Pt smoker: former smoker, quit in 2013, started at age 61, states he smoked for about 30 years  Pt meds include: Statin : yes ASA: yes Other anticoagulants/antiplatelets: Plavix   Past Medical History:  Diagnosis Date  . Bradycardia, drug induced   . Carotid disease, bilateral (HCC)    a. 01/2013 Carotid U/S: RICA 1-39%, LICA 80-99%. Antegrade vertebral flow; b. s/p L CEA; c. 08/2013 Carotid U/S: Patent LICA, RICA 40%.  . Chronic combined systolic and diastolic CHF (congestive heart failure) (HCC)    a. 09/2013 Echo: Ef 45-50%, mid-apicalinferoseptal AK, Gr2 DD.  [name] CKD (chronic kidney disease), stage II   . Coronary artery disease    a. 1996 s/p MI [place] Rx;  b. 1998 s/p MI in [place] with CABGx5 in [place];  c. 2010 PCI to native OM in [place], [place];  e. 09/2014 NSTEMI/PCI: VG->PDA (DES x 2), mLAD (DES), dLAD (PTCA); f. 02/2016 Cath: LM 70 (stable), LAD 100ost/p, patent distal stent beyond LIMA, LCX nl, OM1 100, RCA 173m, LIMA->LAD nl, VG->D1 nl, VG->RPDA nl w/ patent stents, VG->OM1 100, EF nl-->Med Rx.  . Diabetes mellitus without complication (HCC) 2015  . Erectile dysfunction   . History of tobacco abuse   . Hyperlipidemia   . Hypertensive  heart disease   . Stroke [facility])    a. 2005;  b. 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    Social History Social History  Substance Use Topics  . Smoking status: Former Smoker    Packs/day: 0.80    Years: 30.00    Types: Cigarettes    Quit date: 08/20/2003  . Smokeless tobacco: Never Used  . Alcohol use Yes     Comment: occ    Family History Family History  Problem Relation Age of Onset  . CAD Father     died @ 7  . Heart disease Father     Before 47 years old  . Hyperlipidemia Father   . Heart attack Father   . Other Mother     died @ 4  . Heart disease Mother   . Hyperlipidemia Mother   . Varicose Veins Mother     Surgical History Past Surgical History:  Procedure Laterality Date  . CARDIAC CATHETERIZATION N/A 02/27/2015   Procedure: Left Heart Cath and Cors/Grafts Angiography;  Surgeon: [name], MD;  Location: [facility] INVASIVE CV LAB;  Service: Cardiovascular;  Laterality: N/A;  . CARDIAC SURGERY  07/29/1996  . CAROTID ENDARTERECTOMY Left 01-18-13   cea  . COLONOSCOPY    . CORONARY ANGIOPLASTY    . CORONARY ARTERY BYPASS GRAFT    . CORONARY STENT PLACEMENT    . ENDARTERECTOMY  Left 01/18/2013   Procedure: LEFT CAROTID ENDARTERECTOMY WITH PATCH ANGIOPLASTY;  Surgeon: [name], MD;  Location[facility] OR;  Service: Vascular;  Laterality: Left;  . LEFT HEART CATH Left September 27, 2013  . LEFT HEART CATHETERIZATION WITH CORONARY ANGIOGRAM N/A 09/27/2013   Procedure: LEFT HEART CATHETERIZATION WITH CORONARY ANGIOGRAM;  Surgeon: [name], MD;  Location: [facility] CATH LAB;  Service: Cardiovascular;  Laterality: N/A;  . PERCUTANEOUS CORONARY STENT INTERVENTION (PCI-S)  September 29, 2013  . PERCUTANEOUS CORONARY STENT INTERVENTION (PCI-S) N/A 09/29/2013   Procedure: PERCUTANEOUS CORONARY STENT INTERVENTION (PCI-S);  Surgeon: [name], MD;  Location: [facility] CATH LAB;  Service: Cardiovascular;  Laterality: N/A;    No Known Allergies  Current  Outpatient Prescriptions  Medication Sig Dispense Refill  . aspirin EC 81 MG tablet Take 81 mg by mouth daily.    [name] atorvastatin (LIPITOR) 80 MG tablet Take 1 tablet (80 mg total) by mouth daily. 90 tablet 3  . clopidogrel (PLAVIX) 75 MG tablet TAKE 1 TABLET ONCE A DAY ORALLY 90 tablet 3  . lisinopril (PRINIVIL,ZESTRIL) 10 MG tablet Take 10 mg by mouth daily.    . metFORMIN (GLUCOPHAGE) 500 MG tablet Take 500 mg by mouth 2 (two) times daily.  4  . metoprolol tartrate (LOPRESSOR) 25 MG tablet TAKE 1 TABLET (25 MG TOTAL) BY MOUTH 2 (TWO) TIMES DAILY. 180 tablet 3  . NITROSTAT 0.4 MG SL tablet PLACE 1 TABLET UNDER THE TOUNGE EVERY 5 MINS X 3 DOSES AS NEEDED FOR CHEST PAIN 25 tablet 5  . triamterene-hydrochlorothiazide (DYAZIDE) 37.5-25 MG per capsule Take 1 each (1 capsule total) by mouth daily. 30 capsule 6  . Ustekinumab (STELARA) 90 MG/ML SOSY Inject 90 mg into the skin every 3 (three) months.     [name] VIAGRA 100 MG tablet Take 100 mg by mouth as needed for erectile dysfunction.      No current facility-administered medications for this visit.     Review of Systems : See HPI for pertinent positives and negatives.  Physical Examination  Vitals:   12/13/15 1040 12/13/15 1042  BP: 128/79 128/81  Pulse: (!) 52   Resp: 16   Temp: 97.7 F (36.5 C)   TempSrc: Oral   SpO2: 98%   Weight: 242 lb (109.8 kg)   Height: 5\' 10"  (1.778 m)    Body mass index is 34.72 kg/m.  General: WDWN obese male in NAD GAIT: normal Eyes: PERRLA Pulmonary: Respirations are non-labored, CTAB, good air movement  Cardiac: regular rhythm, no detected murmur.  VASCULAR EXAM Carotid Bruits Right Left   Negative Negative    Aorta is not palpable. Radial pulses are 2+ palpable and equal.  LE Pulses Right Left       POPLITEAL  not palpable  not palpable        POSTERIOR TIBIAL  not palpable  not palpable       DORSALIS PEDIS      ANTERIOR TIBIAL faintly palpable faintly palpable    Gastrointestinal: soft, nontender, BS WNL, no r/g,  no palpable masses.  Musculoskeletal: no muscle atrophy/wasting. M/S 5/5 throughout, extremities without ischemic changes.  Neurologic: A&O X 3; Appropriate Affect, Speech is normal CN 2-12 intact, pain and light touch intact in extremities, Motor exam as listed above.     Assessment: Francis Brown is a 65 y.o. male who who is s/p L CEA (Date: 01/18/13).  He had a stroke in 2005, TIA in 2012 and 2014 right before the left CEA, no known stroke or TIA since 2014. His atherosclerotic risk factors include uncontrolled DM, former smoker, CAD, and obesity.   DATA Today's carotid Duplex suggests less than 40% right internal carotid artery stenosis and a patent left carotid endarterectomy with no evidence for restenosis.  No change from prior exam of 08/19/13 and 12/01/14.   Plan: Follow-up in 1 year with Carotid Duplex scan.   I discussed in depth with the patient the nature of atherosclerosis, and emphasized the importance of maximal medical management including strict control of blood pressure, blood glucose, and lipid levels, obtaining regular exercise, and continued cessation of smoking.  The patient is aware that without maximal medical management the underlying atherosclerotic disease process will progress, limiting the benefit of any interventions. The patient was given information about stroke prevention and what symptoms should prompt the patient to seek immediate medical care. Thank you for allowing us to participate in this patient's care.  Charisse MarchSuzanne Nene Aranas, RN, MSN, FNP-C [facility] Office: [phone]  Clinic Physician: Darrick PennaFields  12/13/15 10:48 AM

## 2015-12-13 NOTE — Patient Instructions (Signed)
Stroke Prevention Some medical conditions and behaviors are associated with an increased chance of having a stroke. You may prevent a stroke by making healthy choices and managing medical conditions. HOW CAN I REDUCE MY RISK OF HAVING A STROKE?   Stay physically active. Get at least 30 minutes of activity on most or all days.  Do not smoke. It may also be helpful to avoid exposure to secondhand smoke.  Limit alcohol use. Moderate alcohol use is considered to be:  No more than 2 drinks per day for men.  No more than 1 drink per day for nonpregnant women.  Eat healthy foods. This involves:  Eating 5 or more servings of fruits and vegetables a day.  Making dietary changes that address high blood pressure (hypertension), high cholesterol, diabetes, or obesity.  Manage your cholesterol levels.  Making food choices that are high in fiber and low in saturated fat, trans fat, and cholesterol may control cholesterol levels.  Take any prescribed medicines to control cholesterol as directed by your health care provider.  Manage your diabetes.  Controlling your carbohydrate and sugar intake is recommended to manage diabetes.  Take any prescribed medicines to control diabetes as directed by your health care provider.  Control your hypertension.  Making food choices that are low in salt (sodium), saturated fat, trans fat, and cholesterol is recommended to manage hypertension.  Ask your health care provider if you need treatment to lower your blood pressure. Take any prescribed medicines to control hypertension as directed by your health care provider.  If you are 18-39 years of age, have your blood pressure checked every 3-5 years. If you are 40 years of age or older, have your blood pressure checked every year.  Maintain a healthy weight.  Reducing calorie intake and making food choices that are low in sodium, saturated fat, trans fat, and cholesterol are recommended to manage  weight.  Stop drug abuse.  Avoid taking birth control pills.  Talk to your health care provider about the risks of taking birth control pills if you are over 35 years old, smoke, get migraines, or have ever had a blood clot.  Get evaluated for sleep disorders (sleep apnea).  Talk to your health care provider about getting a sleep evaluation if you snore a lot or have excessive sleepiness.  Take medicines only as directed by your health care provider.  For some people, aspirin or blood thinners (anticoagulants) are helpful in reducing the risk of forming abnormal blood clots that can lead to stroke. If you have the irregular heart rhythm of atrial fibrillation, you should be on a blood thinner unless there is a good reason you cannot take them.  Understand all your medicine instructions.  Make sure that other conditions (such as anemia or atherosclerosis) are addressed. SEEK IMMEDIATE MEDICAL CARE IF:   You have sudden weakness or numbness of the face, arm, or leg, especially on one side of the body.  Your face or eyelid droops to one side.  You have sudden confusion.  You have trouble speaking (aphasia) or understanding.  You have sudden trouble seeing in one or both eyes.  You have sudden trouble walking.  You have dizziness.  You have a loss of balance or coordination.  You have a sudden, severe headache with no known cause.  You have new chest pain or an irregular heartbeat. Any of these symptoms may represent a serious problem that is an emergency. Do not wait to see if the symptoms will   go away. Get medical help at once. Call your local emergency services (911 in [place] Do not drive yourself to the hospital.   This information is not intended to replace advice given to you by your health care provider. Make sure you discuss any questions you have with your health care provider.   Document Released: 05/08/2004 Document Revised: 04/21/2014 Document Reviewed:  10/01/2012 Elsevier Interactive Patient Education 2016 Elsevier Inc.  

## 2016-04-21 ENCOUNTER — Telehealth: Payer: Self-pay | Admitting: Cardiology

## 2016-04-21 DIAGNOSIS — I10 Essential (primary) hypertension: Secondary | ICD-10-CM

## 2016-04-21 DIAGNOSIS — E785 Hyperlipidemia, unspecified: Secondary | ICD-10-CM

## 2016-04-21 NOTE — Telephone Encounter (Signed)
FLP, ALT and BMET 

## 2016-04-21 NOTE — Telephone Encounter (Signed)
Fasting labs to be drawn 1/12. Patient agrees with treatment plan.

## 2016-04-21 NOTE — Telephone Encounter (Signed)
New Message    Does he need to have Labs done before appointment w/Dr Mayford Knifeurner?

## 2016-04-25 ENCOUNTER — Other Ambulatory Visit: Payer: BLUE CROSS/BLUE SHIELD

## 2016-05-14 ENCOUNTER — Ambulatory Visit: Payer: BLUE CROSS/BLUE SHIELD | Admitting: Cardiology

## 2016-05-15 ENCOUNTER — Encounter: Payer: Self-pay | Admitting: Cardiology

## 2016-05-20 ENCOUNTER — Ambulatory Visit: Payer: BLUE CROSS/BLUE SHIELD | Admitting: Cardiology

## 2016-05-21 ENCOUNTER — Encounter: Payer: Self-pay | Admitting: Cardiology

## 2016-07-12 ENCOUNTER — Other Ambulatory Visit: Payer: Self-pay | Admitting: Cardiology

## 2016-09-14 ENCOUNTER — Other Ambulatory Visit: Payer: Self-pay | Admitting: Cardiology

## 2016-10-13 ENCOUNTER — Other Ambulatory Visit: Payer: Self-pay | Admitting: Cardiology

## 2016-10-14 ENCOUNTER — Other Ambulatory Visit: Payer: Self-pay | Admitting: Cardiology

## 2016-10-17 ENCOUNTER — Other Ambulatory Visit: Payer: Self-pay | Admitting: Cardiology

## 2016-10-19 ENCOUNTER — Other Ambulatory Visit: Payer: Self-pay | Admitting: Cardiology

## 2016-11-12 ENCOUNTER — Telehealth: Payer: Self-pay | Admitting: Cardiology

## 2016-11-12 ENCOUNTER — Other Ambulatory Visit: Payer: Self-pay | Admitting: *Deleted

## 2016-11-12 ENCOUNTER — Other Ambulatory Visit: Payer: BLUE CROSS/BLUE SHIELD

## 2016-11-12 MED ORDER — LISINOPRIL 10 MG PO TABS: 10.0000 mg | ORAL_TABLET | Freq: Every day | ORAL | 0 refills | Status: DC

## 2016-11-12 NOTE — Telephone Encounter (Signed)
Pt's medication was sent to pt's pharmacy as requested. Confirmation received.  °

## 2016-11-12 NOTE — Telephone Encounter (Signed)
NEW MESSAGE   *STAT* If patient is at the pharmacy, call can be transferred to refill team.   1. Which medications need to be refilled? (please list name of each medication and dose if known)  LISINOPRIL  2. Which pharmacy/location (including street and city if local pharmacy) is medication to be sent to? [facility] [address]  3. Do they need a 30 day or 90 day supply?  90DAY

## 2016-11-21 ENCOUNTER — Other Ambulatory Visit: Payer: Self-pay | Admitting: Cardiology

## 2016-11-22 ENCOUNTER — Other Ambulatory Visit: Payer: Self-pay | Admitting: Cardiology

## 2016-12-05 ENCOUNTER — Ambulatory Visit (INDEPENDENT_AMBULATORY_CARE_PROVIDER_SITE_OTHER): Payer: BLUE CROSS/BLUE SHIELD | Admitting: Cardiology

## 2016-12-05 ENCOUNTER — Encounter: Payer: Self-pay | Admitting: Cardiology

## 2016-12-05 VITALS — BP 120/82 | HR 56 | Ht 70.0 in | Wt 236.4 lb

## 2016-12-05 DIAGNOSIS — I2583 Coronary atherosclerosis due to lipid rich plaque: Secondary | ICD-10-CM | POA: Diagnosis not present

## 2016-12-05 DIAGNOSIS — I5042 Chronic combined systolic (congestive) and diastolic (congestive) heart failure: Secondary | ICD-10-CM

## 2016-12-05 DIAGNOSIS — I10 Essential (primary) hypertension: Secondary | ICD-10-CM | POA: Diagnosis not present

## 2016-12-05 DIAGNOSIS — I2510 Atherosclerotic heart disease of native coronary artery without angina pectoris: Secondary | ICD-10-CM

## 2016-12-05 MED ORDER — ALPRAZOLAM 0.25 MG PO TABS: 0.2500 mg | ORAL_TABLET | Freq: Three times a day (TID) | ORAL | 0 refills | Status: AC | PRN

## 2016-12-05 NOTE — Patient Instructions (Addendum)
Medication Instructions:  Your physician recommends that you continue on your current medications as directed. Please refer to the Current Medication list given to you today.   Labwork: AT TIME OF STRESS TEST:  FASTING LIPID & LFT  Testing/Procedures: Your physician has requested that you have an exercise tolerance test. For further information please visit [email or web address] Please also follow instruction sheet, as given.    Follow-Up: Your physician wants you to follow-up in: 6 MONTHS WITH DR. Sherlyn Lick will receive a reminder letter in the mail two months in advance. If you don't receive a letter, please call our office to schedule the follow-up appointment.   Any Other Special Instructions Will Be Listed Below (If Applicable).   Exercise Stress Electrocardiogram An exercise stress electrocardiogram is a test that is done to evaluate the blood supply to your heart. This test may also be called exercise stress electrocardiography. The test is done while you are walking on a treadmill. The goal of this test is to raise your heart rate. This test is done to find areas of poor blood flow to the heart by determining the extent of coronary artery disease (CAD). CAD is defined as narrowing in one or more heart (coronary) arteries of more than 70%. If you have an abnormal test result, this may mean that you are not getting adequate blood flow to your heart during exercise. Additional testing may be needed to understand why your test was abnormal. Tell a health care provider about:  Any allergies you have.  All medicines you are taking, including vitamins, herbs, eye drops, creams, and over-the-counter medicines.  Any problems you or family members have had with anesthetic medicines.  Any blood disorders you have.  Any surgeries you have had.  Any medical conditions you have.  Possibility of pregnancy, if this applies. What are the risks? Generally, this is a safe procedure.  However, as with any procedure, complications can occur. Possible complications can include:  Pain or pressure in the following areas: ? Chest. ? Jaw or neck. ? Between your shoulder blades. ? Radiating down your left arm.  Dizziness or light-headedness.  Shortness of breath.  Increased or irregular heartbeats.  Nausea or vomiting.  Heart attack (rare).  What happens before the procedure?  Avoid all forms of caffeine 24 hours before your test or as directed by your health care provider. This includes coffee, tea (even decaffeinated tea), caffeinated sodas, chocolate, cocoa, and certain pain medicines.  Follow your health care provider's instructions regarding eating and drinking before the test.  Take your medicines as directed at regular times with water unless instructed otherwise. Exceptions may include: ? If you have diabetes, ask how you are to take your insulin or pills. It is common to adjust insulin dosing the morning of the test. ? If you are taking beta-blocker medicines, it is important to talk to your health care provider about these medicines well before the date of your test. Taking beta-blocker medicines may interfere with the test. In some cases, these medicines need to be changed or stopped 24 hours or more before the test. ? If you wear a nitroglycerin patch, it may need to be removed prior to the test. Ask your health care provider if the patch should be removed before the test.  If you use an inhaler for any breathing condition, bring it with you to the test.  If you are an outpatient, bring a snack so you can eat right after the stress phase  of the test.  Do not smoke for 4 hours prior to the test or as directed by your health care provider.  Do not apply lotions, powders, creams, or oils on your chest prior to the test.  Wear loose-fitting clothes and comfortable shoes for the test. This test involves walking on a treadmill. What happens during the  procedure?  Multiple patches (electrodes) will be put on your chest. If needed, small areas of your chest may have to be shaved to get better contact with the electrodes. Once the electrodes are attached to your body, multiple wires will be attached to the electrodes and your heart rate will be monitored.  Your heart will be monitored both at rest and while exercising.  You will walk on a treadmill. The treadmill will be started at a slow pace. The treadmill speed and incline will gradually be increased to raise your heart rate. What happens after the procedure?  Your heart rate and blood pressure will be monitored after the test.  You may return to your normal schedule including diet, activities, and medicines, unless your health care provider tells you otherwise. This information is not intended to replace advice given to you by your health care provider. Make sure you discuss any questions you have with your health care provider. Document Released: 03/28/2000 Document Revised: 09/06/2015 Document Reviewed: 12/06/2012 Elsevier Interactive Patient Education  2017 [employer].   If you need a refill on your cardiac medications before your next appointment, please call your pharmacy.

## 2016-12-05 NOTE — Progress Notes (Signed)
12/05/2016 Francis Brown   1951-01-31  [id number]  Primary Physician Francis Brown, Francis G, MD Primary Cardiologist: Dr. Mayford Knife  Reason for Visit/CC: 1 yr f/u for CAD  HPI:  66 y.o. male with a hx of CAD, s/p CABG (MI in [place] >>> CABG in [place], [place]) and prior DES to SVG-OM in 2010 ([place], [place]), NSTEMI 09/2014 with cath demonstrating an occluded SVG-OM1, severe graft disease in the SVG-RV marginal/PDA and severe disease in the native LAD distal to the graft insertion s/p DES to the SVG-PDA, DES to the mid LAD and balloon angioplasty to the distal LAD. Repeat cath in 02/2015 showed patent stents in the SVG to PDA and LAD and otherwise patent grafts except chronically occluded SVG-OM.  He also has a history of prior CVA, carotid stenosis s/p L CEA, HTN and  HL.    He is followed by Dr. Mayford Knife and presents to clinic today for routine f/u for CAD. He was last seen a little over a year ago, 09/06/15. He was doing well at that time.  Today in clinic, he is very anxious. He has a lot of anxiety given his medical history. He also notes that both of his parents died at age 19 and he is 30 y/o. He will turn 42 in January. He is also scared because he notes that he had no warning signs prior to his MIs in the past. He had never had the typical chest pressure or exertional dyspnea. Past presentations have usually been acute sickness w/ malaise, N/V.   He is very active. 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 He denies exertional CP and dyspnea, but again has never had typical symptoms.  He often gets sharp pain in his left shoulder, neck and elbow but seems to be more musculoskeletal as it is often worse with movement and positional changes. He is currently pain free. His EKG today shows sinus bradycardia, 53 bpm but no ischemic abnormalities.   He reports full med compliance. He is overdue for a lipid panel. He is not fasting today. BP is well controlled.   He is in the process of  getting a new PCP but has not yet got an appt with a new provider.   Current Meds  Medication Sig  . aspirin EC 81 MG tablet Take 81 mg by mouth daily.  Francis Brown atorvastatin (LIPITOR) 80 MG tablet TAKE 1 TABLET BY MOUTH EVERY DAY  . clopidogrel (PLAVIX) 75 MG tablet Take 1 tablet (75 mg total) by mouth daily. Please keep upcoming appointment for further refills  . lisinopril (PRINIVIL,ZESTRIL) 10 MG tablet Take 1 tablet (10 mg total) by mouth daily.  . metoprolol tartrate (LOPRESSOR) 25 MG tablet TAKE 1 TABLET BY MOUTH TWICE A DAY  . NITROSTAT 0.4 MG SL tablet PLACE 1 TABLET UNDER THE TOUNGE EVERY 5 MINS X 3 DOSES AS NEEDED FOR CHEST PAIN  . triamterene-hydrochlorothiazide (DYAZIDE) 37.5-25 MG capsule Take 1 each (1 capsule total) by mouth daily. Please keep 12/05/16 appt for future refill authorization  . Ustekinumab (STELARA) 90 MG/ML SOSY Inject 90 mg into the skin every 3 (three) months.   Francis Brown VIAGRA 100 MG tablet Take 100 mg by mouth as needed for erectile dysfunction.    No Known Allergies Past Medical History:  Diagnosis Date  . Bradycardia, drug induced   . Carotid disease, bilateral (HCC)    a. 01/2013 Carotid U/S: RICA 1-39%, LICA 80-99%. Antegrade vertebral flow; b.  s/p L CEA; c. 08/2013 Carotid U/S: Patent LICA, RICA 40%.  . Chronic combined systolic and diastolic CHF (congestive heart failure) (HCC)    a. 09/2013 Echo: Ef 45-50%, mid-apicalinferoseptal AK, Gr2 DD.  Francis Brown CKD (chronic kidney disease), stage II   . Coronary artery disease    a. 1996 s/p MI [place] Francis Brown;  b. 1998 s/p MI in [place] with CABGx5 in [place];  c. 2010 PCI to native OM in [place], [place];  e. 09/2014 NSTEMI/PCI: VG->PDA (DES x 2), mLAD (DES), dLAD (PTCA); f. 02/2016 Cath: LM 70 (stable), LAD 100ost/p, patent distal stent beyond LIMA, LCX nl, OM1 100, RCA 166m, LIMA->LAD nl, VG->D1 nl, VG->RPDA nl w/ patent stents, VG->OM1 100, EF nl-->Med Brown.  . Diabetes mellitus without complication (HCC) 2015  . Erectile dysfunction   .  History of tobacco abuse   . Hyperlipidemia   . Hypertensive heart disease   . Stroke [facility])    a. 2005;  b. 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   Family History  Problem Relation Age of Onset  . CAD Father        died @ 65  . Heart disease Father        Before 34 years old  . Hyperlipidemia Father   . Heart attack Father   . Other Mother        died @ 50  . Heart disease Mother   . Hyperlipidemia Mother   . Varicose Veins Mother    Past Surgical History:  Procedure Laterality Date  . CARDIAC CATHETERIZATION N/A 02/27/2015   Procedure: Left Heart Cath and Cors/Grafts Angiography;  Surgeon: Francis M [name], MD;  Location: [facility] INVASIVE CV LAB;  Service: Cardiovascular;  Laterality: N/A;  . CARDIAC SURGERY  07/29/1996  . CAROTID ENDARTERECTOMY Left 01-18-13   cea  . COLONOSCOPY    . CORONARY ANGIOPLASTY    . CORONARY ARTERY BYPASS GRAFT    . CORONARY STENT PLACEMENT    . ENDARTERECTOMY Left 01/18/2013   Procedure: LEFT CAROTID ENDARTERECTOMY WITH PATCH ANGIOPLASTY;  Surgeon: Francis Hertz, MD;  Location: [facility] OR;  Service: Vascular;  Laterality: Left;  . LEFT HEART CATH Left September 27, 2013  . LEFT HEART CATHETERIZATION WITH CORONARY ANGIOGRAM N/A 09/27/2013   Procedure: LEFT HEART CATHETERIZATION WITH CORONARY ANGIOGRAM;  Surgeon: Francis Noe, MD;  Location: [facility] CATH LAB;  Service: Cardiovascular;  Laterality: N/A;  . PERCUTANEOUS CORONARY STENT INTERVENTION (PCI-S)  September 29, 2013  . PERCUTANEOUS CORONARY STENT INTERVENTION (PCI-S) N/A 09/29/2013   Procedure: PERCUTANEOUS CORONARY STENT INTERVENTION (PCI-S);  Surgeon: Francis M [name], MD;  Location: [facility] CATH LAB;  Service: Cardiovascular;  Laterality: N/A;   Social History   Social History  . Marital status: Divorced    Spouse name: N/A  . Number of children: N/A  . Years of education: N/A   Occupational History  . Not on file.   Social History Main Topics  . Smoking status: Former Smoker     Packs/day: 0.80    Years: 30.00    Types: Cigarettes    Quit date: 08/20/2003  . Smokeless tobacco: Never Used  . Alcohol use Yes     Comment: occ  . Drug use: No  . Sexual activity: Not on file   Other Topics Concern  . 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  Spent much of his work life [place] but  is now base in [place].  Does not routinely exercise but is active around the house w/o limitations.           Review of Systems: General: negative for chills, fever, night sweats or weight changes.  Cardiovascular: negative for chest pain, dyspnea on exertion, edema, orthopnea, palpitations, paroxysmal nocturnal dyspnea or shortness of breath Dermatological: negative for rash Respiratory: negative for cough or wheezing Urologic: negative for hematuria Abdominal: negative for nausea, vomiting, diarrhea, bright red blood per rectum, melena, or hematemesis Neurologic: negative for visual changes, syncope, or dizziness All other systems reviewed and are otherwise negative except as noted above.   Physical Exam:  Blood pressure 120/82, pulse (!) 56, height 5\' 10"  (1.778 m), weight 236 lb 6.4 oz (107.2 kg), SpO2 97 %.  General appearance: alert, cooperative, anxious Neck: no carotid bruit and no JVD Lungs: clear to auscultation bilaterally Heart: regular rate and rhythm, S1, S2 normal, no murmur, click, rub or gallop Extremities: extremities normal, atraumatic, no cyanosis or edema Pulses: 2+ and symmetric Skin: Skin color, texture, turgor normal. No rashes or lesions Neurologic: Grossly normal  EKG sinus bradycardia, rate in the 50s -- personally reviewed   ASSESSMENT AND PLAN:   1. CAD: h/o CABG and stenting to SVG-PDA and LAD in June 2016. Repeat cath in 02/2015 showed patent stents in the SVG to PDA and LAD and otherwise patent grafts except chronically occluded SVG-OM. No exertional CP or dyspnea, however pt has had  multiple heart attacks and never had warning symptoms of chest pain or dyspnea. He is also a diabetic so ? Silent ischemia. Given his history, we will plan on an ETT to assess for ischemia. Continue medical therapy with ASA, Plavix, Lipitor, Metoprolol and Lisinopril.   2. HTN: controlled on current regimen. No changes made today.   3. HLD: he is due for FLP. Goal LDL is <70 mg/dL. Will order FLP and HFTs.   4. Carotid Artery Stenosis: h/o L CEA, followed yearly by Dr. [name]. He denies dizziness. No bruits on exam. He is on ASA, Plavix and statin. BP is well controlled.   6. Anxiety: he is very anxious. He has a lot of anxiety given his medical history. He also notes that both of his parents died at age 73 and he is 42 y/o. He will turn 50 in January. He is also scared because he notes that he had no warning signs prior to his MIs in the past. He is in the process of getting a new PCP but has not yet got an appt with a new provider. I will plan to give an 1x Brown for PRN Xanax but notified patient that he will have to get additional refills from his new PCP. #30 tabs. No refills. He verbalized understanding.    PLAN  We will arrange f/u with APP if abnormal ETT. If normal, plan to f/u with Dr. Mayford Knife in 6 months.     Brittainy Delmer Islam, MHS [facility] 12/05/2016 1:49 PM

## 2016-12-06 ENCOUNTER — Other Ambulatory Visit: Payer: Self-pay | Admitting: Cardiology

## 2016-12-12 ENCOUNTER — Encounter (HOSPITAL_COMMUNITY): Payer: BLUE CROSS/BLUE SHIELD

## 2016-12-12 ENCOUNTER — Ambulatory Visit: Payer: BLUE CROSS/BLUE SHIELD | Admitting: Family

## 2016-12-16 ENCOUNTER — Other Ambulatory Visit: Payer: Self-pay | Admitting: *Deleted

## 2016-12-16 MED ORDER — LISINOPRIL 10 MG PO TABS: 10.0000 mg | ORAL_TABLET | Freq: Every day | ORAL | 0 refills | Status: AC

## 2016-12-16 MED ORDER — ATORVASTATIN CALCIUM 80 MG PO TABS: 80.0000 mg | ORAL_TABLET | Freq: Every day | ORAL | 11 refills | Status: AC

## 2016-12-16 MED ORDER — METOPROLOL TARTRATE 25 MG PO TABS: 25.0000 mg | ORAL_TABLET | Freq: Two times a day (BID) | ORAL | 11 refills | Status: DC

## 2016-12-16 MED ORDER — NITROGLYCERIN 0.4 MG SL SUBL: 0.4000 mg | SUBLINGUAL_TABLET | SUBLINGUAL | 5 refills | Status: AC | PRN

## 2016-12-16 MED ORDER — TRIAMTERENE-HCTZ 37.5-25 MG PO CAPS: 1.0000 | ORAL_CAPSULE | Freq: Every day | ORAL | 11 refills | Status: DC

## 2016-12-16 MED ORDER — CLOPIDOGREL BISULFATE 75 MG PO TABS: 75.0000 mg | ORAL_TABLET | Freq: Every day | ORAL | 11 refills | Status: AC

## 2016-12-23 ENCOUNTER — Other Ambulatory Visit: Payer: BLUE CROSS/BLUE SHIELD

## 2016-12-24 ENCOUNTER — Other Ambulatory Visit: Payer: Self-pay | Admitting: Cardiology

## 2016-12-24 NOTE — Telephone Encounter (Signed)
Medication Detail    Disp Refills Start End   clopidogrel (PLAVIX) 75 MG tablet 30 tablet 11 12/16/2016    Sig - Route: Take 1 tablet (75 mg total) by mouth daily. - Oral   Sent to pharmacy as: clopidogrel (PLAVIX) 75 MG tablet   E-Prescribing Status: Receipt confirmed by pharmacy (12/16/2016 8:47 AM EDT)   Pharmacy   [id number] - [place], [place] - [address]

## 2016-12-25 ENCOUNTER — Other Ambulatory Visit: Payer: Self-pay | Admitting: Cardiology

## 2016-12-25 NOTE — Telephone Encounter (Signed)
Medication Detail    Disp Refills Start End   triamterene-hydrochlorothiazide (DYAZIDE) 37.5-25 MG capsule 30 capsule 11 12/16/2016    Sig - Route: Take 1 each (1 capsule total) by mouth daily. - Oral   Sent to pharmacy as: triamterene-hydrochlorothiazide (DYAZIDE) 37.5-25 MG capsule   E-Prescribing Status: Receipt confirmed by pharmacy (12/16/2016 8:47 AM EDT)   Pharmacy   [id number] - [place], [place] - [address]

## 2016-12-26 ENCOUNTER — Telehealth: Payer: Self-pay | Admitting: Cardiology

## 2016-12-26 NOTE — Telephone Encounter (Signed)
New message    Pt is calling about his medication that was supposed to be called in at his last visit.     *STAT* If patient is at the pharmacy, call can be transferred to refill team.   1. Which medications need to be refilled? (please list name of each medication and dose if known) xanax   2. Which pharmacy/location (including street and city if local pharmacy) is [facility] [address]  3. Do they need a 30 day or 90 day supply? 30 day

## 2016-12-30 ENCOUNTER — Other Ambulatory Visit: Payer: BLUE CROSS/BLUE SHIELD

## 2016-12-30 ENCOUNTER — Ambulatory Visit (INDEPENDENT_AMBULATORY_CARE_PROVIDER_SITE_OTHER): Payer: BLUE CROSS/BLUE SHIELD

## 2016-12-30 DIAGNOSIS — I2583 Coronary atherosclerosis due to lipid rich plaque: Secondary | ICD-10-CM

## 2016-12-30 DIAGNOSIS — I5042 Chronic combined systolic (congestive) and diastolic (congestive) heart failure: Secondary | ICD-10-CM

## 2016-12-30 DIAGNOSIS — I2510 Atherosclerotic heart disease of native coronary artery without angina pectoris: Secondary | ICD-10-CM

## 2016-12-30 DIAGNOSIS — I10 Essential (primary) hypertension: Secondary | ICD-10-CM

## 2016-12-30 LAB — EXERCISE TOLERANCE TEST
CHL CUP MPHR: 156 {beats}/min
CHL RATE OF PERCEIVED EXERTION: 16
CSEPEDS: 30 s
CSEPHR: 93 %
Estimated workload: 9.3 METS
Exercise duration (min): 7 min
Peak HR: 146 {beats}/min
Rest HR: 87 {beats}/min

## 2016-12-30 LAB — LIPID PANEL
CHOL/HDL RATIO: 5.2 ratio — AB (ref 0.0–5.0)
Cholesterol, Total: 194 mg/dL (ref 100–199)
HDL: 37 mg/dL — ABNORMAL LOW (ref >39)
LDL Calculated: 116 mg/dL — ABNORMAL HIGH (ref 0–99)
Triglycerides: 205 mg/dL — ABNORMAL HIGH (ref 0–149)
VLDL CHOLESTEROL CAL: 41 mg/dL — AB (ref 5–40)

## 2016-12-30 LAB — HEPATIC FUNCTION PANEL
ALK PHOS: 86 IU/L (ref 39–117)
ALT: 34 IU/L (ref 0–44)
AST: 25 IU/L (ref 0–40)
Albumin: 4.4 g/dL (ref 3.6–4.8)
Bilirubin Total: 0.3 mg/dL (ref 0.0–1.2)
Bilirubin, Direct: 0.12 mg/dL (ref 0.00–0.40)
TOTAL PROTEIN: 7.4 g/dL (ref 6.0–8.5)

## 2016-12-30 NOTE — Telephone Encounter (Signed)
Pt was seen in the office today for a GXT after which Theodoro Kos came to [place] and informed her that the patient had gone to his pharmacy twice and they had no record of [place] sending the Xanax RX in to them. I called to verify with the pharmacy and she confirmed they had no record of the RX for the patient. He was given a hand written RX for Alprazolam 0.25 mg - take 1 tablet by mouth three times daily as needed with 30 tablets and 0 refills. A copy of the RX was made for records.

## 2016-12-31 ENCOUNTER — Telehealth: Payer: Self-pay | Admitting: *Deleted

## 2016-12-31 DIAGNOSIS — Z79899 Other long term (current) drug therapy: Secondary | ICD-10-CM

## 2016-12-31 NOTE — Telephone Encounter (Signed)
-----   Message from Allayne Butcher, [place] sent at 12/30/2016  5:54 PM EDT ----- LDL not at goal, check with pt to see if he is taking Lipitor 80 mg every night. If not improve compliance. If so, add zetia 10 mg daily. Recheck FLP and HFts in 8 weeks. Goal LDL given CAD is <70. He is at 116. Advise low fat diet.

## 2017-01-01 ENCOUNTER — Encounter: Payer: Self-pay | Admitting: Family Medicine

## 2017-01-01 ENCOUNTER — Telehealth: Payer: Self-pay | Admitting: Cardiology

## 2017-01-01 NOTE — Telephone Encounter (Signed)
Pt's pharmacy requesting a refill on Alprazolam 0.25 mg tablet. Would you like to refill this medication? Please advise

## 2017-01-02 NOTE — Telephone Encounter (Signed)
Patient needs to get this from PCP

## 2017-01-02 NOTE — Telephone Encounter (Signed)
Refill request for alprazolam denied per Dr. Mayford Knife and information faxed to [facility] [address]

## 2017-01-23 ENCOUNTER — Encounter (HOSPITAL_COMMUNITY): Payer: BLUE CROSS/BLUE SHIELD

## 2017-01-23 ENCOUNTER — Ambulatory Visit: Payer: Self-pay | Admitting: Family

## 2017-01-28 ENCOUNTER — Ambulatory Visit: Payer: Self-pay | Admitting: Family

## 2017-02-17 ENCOUNTER — Other Ambulatory Visit: Payer: BLUE CROSS/BLUE SHIELD

## 2017-02-25 ENCOUNTER — Ambulatory Visit (HOSPITAL_COMMUNITY): Payer: BLUE CROSS/BLUE SHIELD

## 2017-02-25 ENCOUNTER — Ambulatory Visit: Payer: Self-pay | Admitting: Family

## 2017-02-26 ENCOUNTER — Ambulatory Visit (HOSPITAL_COMMUNITY): Payer: BLUE CROSS/BLUE SHIELD | Attending: Family

## 2017-02-26 ENCOUNTER — Ambulatory Visit: Payer: Self-pay | Admitting: Family

## 2017-03-11 ENCOUNTER — Telehealth: Payer: Self-pay | Admitting: Cardiology

## 2017-03-11 NOTE — Telephone Encounter (Signed)
Patient made aware that he will need to call his pcp to request that this be refilled. He verbalized his understanding.

## 2017-03-11 NOTE — Telephone Encounter (Signed)
New message    Patient calling to request new prescription of ALPRAZolam (XANAX) 0.25 MG tablet.    *STAT* If patient is at the pharmacy, call can be transferred to refill team.   1. Which medications need to be refilled? (please list name of each medication and dose if known) ALPRAZolam (XANAX) 0.25 MG tablet  2. Which pharmacy/location (including street and city if local pharmacy) is medication to be sent to? [facility] [address]  3. Do they need a 30 day or 90 day supply? 30

## 2017-03-17 ENCOUNTER — Telehealth: Payer: Self-pay | Admitting: Family Medicine

## 2017-03-17 NOTE — Telephone Encounter (Signed)
Pt requesting Prudy FeelerXANAX

## 2017-03-17 NOTE — Telephone Encounter (Signed)
Copied from CRM [id number]. Topic: Quick Communication - Rx Refill/Question >> Mar 17, 2017 10:09 AM Landry MellowFoltz, Melissa J wrote: Has the patient contacted their pharmacy? No.   (Agent: If no, request that the patient contact the pharmacy for the refill.)  ALPRAZolam (XANAX) 0.25 MG tablet . Pt had had cardiologist fill it the last time.  He was told that he would have to get future refills from pcp.     Preferred Pharmacy (with phone number or street name): [facility] [address]    Agent: Please be advised that RX refills may take up to 3 business days. We ask that you follow-up with your pharmacy.

## 2017-03-19 NOTE — Telephone Encounter (Signed)
I called and spoke with patient. Patient was seeing Dr. SwazilandJordan [facility], and [facility]. He has an appointment for 03/25/17 to discuss anxiety. I advised patient that Dr. SwazilandJordan is out of the office until Monday and that another provider in the office would most likely not sign off on the Rx since she hasn't seen [facility] yet. I made patient aware that I would send the message and Dr. Elvis CoilJordan's assistant would get back with them, but that a response may not be given until Monday. Patient verbalized understanding.

## 2017-03-24 NOTE — Telephone Encounter (Signed)
Noted. We will discuss this during re-establishing care appt. I have not seen pt for 2 years, so he can request it form current PCP if necessary. Thanks, BJ

## 2017-03-25 ENCOUNTER — Ambulatory Visit: Payer: BLUE CROSS/BLUE SHIELD | Admitting: Family Medicine

## 2017-06-02 ENCOUNTER — Other Ambulatory Visit: Payer: Self-pay | Admitting: Cardiology

## 2017-12-23 ENCOUNTER — Other Ambulatory Visit: Payer: Self-pay | Admitting: Cardiology

## 2018-01-25 ENCOUNTER — Other Ambulatory Visit: Payer: Self-pay | Admitting: Cardiology

## 2018-01-25 MED ORDER — TRIAMTERENE-HCTZ 37.5-25 MG PO CAPS: 1.0000 | ORAL_CAPSULE | Freq: Every day | ORAL | 0 refills | Status: DC

## 2018-01-25 NOTE — Telephone Encounter (Signed)
Pt's medication was sent to pt's pharmacy as requested. Confirmation received.  °

## 2018-02-24 ENCOUNTER — Other Ambulatory Visit: Payer: Self-pay | Admitting: Cardiology

## 2018-03-25 ENCOUNTER — Other Ambulatory Visit: Payer: Self-pay

## 2018-03-25 NOTE — Telephone Encounter (Signed)
Please advised on refill request. Patient is overdue for appointment and has had multiple refills with a note to call and schedule

## 2018-05-24 ENCOUNTER — Other Ambulatory Visit: Payer: Self-pay | Admitting: Cardiology

## 2018-05-24 MED ORDER — METOPROLOL TARTRATE 25 MG PO TABS: 25.0000 mg | ORAL_TABLET | Freq: Two times a day (BID) | ORAL | 0 refills | Status: AC

## 2019-07-07 ENCOUNTER — Encounter: Payer: Self-pay | Admitting: General Practice
